# Patient Record
Sex: Male | Born: 1945 | Race: White | Hispanic: No | Marital: Married | Smoking: Former smoker
Health system: Southern US, Community
[De-identification: ages and names within clinical notes are randomized; demographics above are authoritative.]

## PROBLEM LIST (undated history)

## (undated) DIAGNOSIS — C801 Malignant (primary) neoplasm, unspecified: Secondary | ICD-10-CM

## (undated) DIAGNOSIS — E079 Disorder of thyroid, unspecified: Secondary | ICD-10-CM

## (undated) DIAGNOSIS — N289 Disorder of kidney and ureter, unspecified: Secondary | ICD-10-CM

## (undated) DIAGNOSIS — G4730 Sleep apnea, unspecified: Secondary | ICD-10-CM

## (undated) DIAGNOSIS — G600 Hereditary motor and sensory neuropathy: Secondary | ICD-10-CM

## (undated) DIAGNOSIS — K219 Gastro-esophageal reflux disease without esophagitis: Secondary | ICD-10-CM

## (undated) DIAGNOSIS — B349 Viral infection, unspecified: Secondary | ICD-10-CM

## (undated) DIAGNOSIS — Z860101 Personal history of adenomatous and serrated colon polyps: Secondary | ICD-10-CM

## (undated) DIAGNOSIS — Z86010 Personal history of colonic polyps: Secondary | ICD-10-CM

## (undated) DIAGNOSIS — F419 Anxiety disorder, unspecified: Secondary | ICD-10-CM

## (undated) DIAGNOSIS — D649 Anemia, unspecified: Secondary | ICD-10-CM

## (undated) DIAGNOSIS — G2581 Restless legs syndrome: Secondary | ICD-10-CM

## (undated) DIAGNOSIS — I10 Essential (primary) hypertension: Secondary | ICD-10-CM

## (undated) DIAGNOSIS — J329 Chronic sinusitis, unspecified: Secondary | ICD-10-CM

## (undated) HISTORY — DX: Essential (primary) hypertension: I10

## (undated) HISTORY — DX: Disorder of thyroid, unspecified: E07.9

## (undated) HISTORY — PX: GANGLION CYST EXCISION: SHX1691

## (undated) HISTORY — DX: Hereditary motor and sensory neuropathy: G60.0

## (undated) HISTORY — DX: Personal history of colonic polyps: Z86.010

## (undated) HISTORY — DX: Restless legs syndrome: G25.81

## (undated) HISTORY — PX: OTHER SURGICAL HISTORY: SHX169

## (undated) HISTORY — DX: Viral infection, unspecified: B34.9

## (undated) HISTORY — PX: NASAL SINUS SURGERY: SHX719

## (undated) HISTORY — DX: Personal history of adenomatous and serrated colon polyps: Z86.0101

## (undated) HISTORY — PX: EYE SURGERY: SHX253

---

## 2001-09-17 ENCOUNTER — Ambulatory Visit (HOSPITAL_COMMUNITY): Admission: RE | Admit: 2001-09-17 | Discharge: 2001-09-17 | Payer: Self-pay | Admitting: Internal Medicine

## 2001-10-06 ENCOUNTER — Ambulatory Visit (HOSPITAL_COMMUNITY): Admission: RE | Admit: 2001-10-06 | Discharge: 2001-10-06 | Payer: Self-pay | Admitting: Internal Medicine

## 2001-10-06 ENCOUNTER — Encounter: Payer: Self-pay | Admitting: Internal Medicine

## 2002-11-07 ENCOUNTER — Encounter: Payer: Self-pay | Admitting: Internal Medicine

## 2002-11-07 ENCOUNTER — Ambulatory Visit (HOSPITAL_COMMUNITY): Admission: RE | Admit: 2002-11-07 | Discharge: 2002-11-07 | Payer: Self-pay | Admitting: Internal Medicine

## 2004-04-08 ENCOUNTER — Ambulatory Visit: Admission: RE | Admit: 2004-04-08 | Discharge: 2004-04-08 | Payer: Self-pay | Admitting: *Deleted

## 2004-09-24 ENCOUNTER — Ambulatory Visit (HOSPITAL_COMMUNITY): Admission: RE | Admit: 2004-09-24 | Discharge: 2004-09-24 | Payer: Self-pay | Admitting: Internal Medicine

## 2004-09-24 ENCOUNTER — Ambulatory Visit: Payer: Self-pay | Admitting: Internal Medicine

## 2005-01-20 ENCOUNTER — Emergency Department (HOSPITAL_COMMUNITY): Admission: EM | Admit: 2005-01-20 | Discharge: 2005-01-20 | Payer: Self-pay | Admitting: *Deleted

## 2006-06-26 ENCOUNTER — Ambulatory Visit (HOSPITAL_COMMUNITY): Admission: RE | Admit: 2006-06-26 | Discharge: 2006-06-26 | Payer: Self-pay | Admitting: Sports Medicine

## 2006-07-09 ENCOUNTER — Encounter (HOSPITAL_COMMUNITY): Admission: RE | Admit: 2006-07-09 | Discharge: 2006-08-08 | Payer: Self-pay | Admitting: Sports Medicine

## 2007-07-06 ENCOUNTER — Ambulatory Visit (HOSPITAL_COMMUNITY): Admission: RE | Admit: 2007-07-06 | Discharge: 2007-07-06 | Payer: Self-pay | Admitting: Internal Medicine

## 2009-01-03 ENCOUNTER — Ambulatory Visit (HOSPITAL_COMMUNITY): Admission: RE | Admit: 2009-01-03 | Discharge: 2009-01-03 | Payer: Self-pay | Admitting: Internal Medicine

## 2009-01-11 ENCOUNTER — Ambulatory Visit (HOSPITAL_COMMUNITY): Admission: RE | Admit: 2009-01-11 | Discharge: 2009-01-11 | Payer: Self-pay | Admitting: Internal Medicine

## 2009-11-29 ENCOUNTER — Encounter: Payer: Self-pay | Admitting: Internal Medicine

## 2010-01-02 ENCOUNTER — Ambulatory Visit (HOSPITAL_COMMUNITY): Admission: RE | Admit: 2010-01-02 | Discharge: 2010-01-02 | Payer: Self-pay | Admitting: Internal Medicine

## 2010-01-02 ENCOUNTER — Ambulatory Visit: Payer: Self-pay | Admitting: Internal Medicine

## 2010-01-02 HISTORY — PX: COLONOSCOPY: SHX5424

## 2010-01-29 ENCOUNTER — Ambulatory Visit (HOSPITAL_COMMUNITY): Admission: RE | Admit: 2010-01-29 | Discharge: 2010-01-29 | Payer: Self-pay | Admitting: Neurology

## 2010-07-15 ENCOUNTER — Encounter: Payer: Self-pay | Admitting: Internal Medicine

## 2010-07-23 NOTE — Letter (Signed)
Summary: TRIAGE ORDER  TRIAGE ORDER   Imported By: Ave Filter 11/29/2009 13:49:41  _____________________________________________________________________  External Attachment:    Type:   Image     Comment:   External Document

## 2010-08-08 ENCOUNTER — Ambulatory Visit (INDEPENDENT_AMBULATORY_CARE_PROVIDER_SITE_OTHER): Payer: Self-pay | Admitting: Otolaryngology

## 2010-08-08 DIAGNOSIS — J310 Chronic rhinitis: Secondary | ICD-10-CM

## 2010-08-08 DIAGNOSIS — J343 Hypertrophy of nasal turbinates: Secondary | ICD-10-CM

## 2010-08-08 DIAGNOSIS — J342 Deviated nasal septum: Secondary | ICD-10-CM

## 2010-08-12 ENCOUNTER — Other Ambulatory Visit (INDEPENDENT_AMBULATORY_CARE_PROVIDER_SITE_OTHER): Payer: Self-pay | Admitting: Otolaryngology

## 2010-08-14 ENCOUNTER — Ambulatory Visit (HOSPITAL_COMMUNITY)
Admission: RE | Admit: 2010-08-14 | Discharge: 2010-08-14 | Disposition: A | Discharge status: Home / Self Care | Payer: BC Managed Care – PPO | Source: Ambulatory Visit | Attending: Otolaryngology | Admitting: Otolaryngology

## 2010-08-14 ENCOUNTER — Ambulatory Visit (HOSPITAL_COMMUNITY): Payer: BC Managed Care – PPO

## 2010-08-14 DIAGNOSIS — J329 Chronic sinusitis, unspecified: Secondary | ICD-10-CM | POA: Insufficient documentation

## 2010-08-14 DIAGNOSIS — R0602 Shortness of breath: Secondary | ICD-10-CM | POA: Insufficient documentation

## 2010-08-14 DIAGNOSIS — J3489 Other specified disorders of nose and nasal sinuses: Secondary | ICD-10-CM | POA: Insufficient documentation

## 2010-08-14 DIAGNOSIS — J342 Deviated nasal septum: Secondary | ICD-10-CM | POA: Insufficient documentation

## 2010-09-17 ENCOUNTER — Other Ambulatory Visit (INDEPENDENT_AMBULATORY_CARE_PROVIDER_SITE_OTHER): Payer: Self-pay | Admitting: Otolaryngology

## 2010-09-17 ENCOUNTER — Encounter (HOSPITAL_COMMUNITY): Payer: BC Managed Care – PPO

## 2010-09-17 LAB — BASIC METABOLIC PANEL
Calcium: 9 mg/dL (ref 8.4–10.5)
GFR calc non Af Amer: >60 mL/min (ref >60)
Glucose, Bld: 117 mg/dL — ABNORMAL HIGH (ref 70–99)
Potassium: 4 mEq/L (ref 3.5–5.1)
Sodium: 137 mEq/L (ref 135–145)

## 2010-09-17 LAB — HEMOGLOBIN AND HEMATOCRIT, BLOOD: HCT: 41.7 % (ref 39.0–52.0)

## 2010-09-19 ENCOUNTER — Other Ambulatory Visit (INDEPENDENT_AMBULATORY_CARE_PROVIDER_SITE_OTHER): Payer: Self-pay | Admitting: Otolaryngology

## 2010-09-19 ENCOUNTER — Ambulatory Visit (HOSPITAL_COMMUNITY)
Admission: RE | Admit: 2010-09-19 | Discharge: 2010-09-19 | Disposition: A | Discharge status: Home / Self Care | Payer: BC Managed Care – PPO | Source: Ambulatory Visit | Attending: Otolaryngology | Admitting: Otolaryngology

## 2010-09-19 DIAGNOSIS — J343 Hypertrophy of nasal turbinates: Secondary | ICD-10-CM

## 2010-09-19 DIAGNOSIS — Z7982 Long term (current) use of aspirin: Secondary | ICD-10-CM | POA: Insufficient documentation

## 2010-09-19 DIAGNOSIS — I10 Essential (primary) hypertension: Secondary | ICD-10-CM | POA: Insufficient documentation

## 2010-09-19 DIAGNOSIS — Z79899 Other long term (current) drug therapy: Secondary | ICD-10-CM | POA: Insufficient documentation

## 2010-09-19 DIAGNOSIS — D386 Neoplasm of uncertain behavior of respiratory organ, unspecified: Secondary | ICD-10-CM

## 2010-09-19 DIAGNOSIS — J339 Nasal polyp, unspecified: Secondary | ICD-10-CM | POA: Insufficient documentation

## 2010-09-19 DIAGNOSIS — J342 Deviated nasal septum: Secondary | ICD-10-CM

## 2010-09-26 ENCOUNTER — Ambulatory Visit (INDEPENDENT_AMBULATORY_CARE_PROVIDER_SITE_OTHER): Payer: BC Managed Care – PPO | Admitting: Otolaryngology

## 2010-09-30 ENCOUNTER — Other Ambulatory Visit (HOSPITAL_COMMUNITY): Payer: BC Managed Care – PPO

## 2010-10-02 NOTE — Op Note (Signed)
NAMELAWAYNE, HARTIG                 ACCOUNT NO.:  [id number]  MEDICAL RECORD NO.:  [id number]           PATIENT TYPE:  O  LOCATION:  DAYP                          FACILITY:  [facility]  PHYSICIAN:  Newman Pies, MD            DATE OF BIRTH:  Dec 23, 1945  DATE OF PROCEDURE:  09/19/2010 DATE OF DISCHARGE:                              OPERATIVE REPORT   SURGEON:  Newman Pies, MD  PREOPERATIVE DIAGNOSES: 1. Severe nasal septal deviation. 2. Bilateral inferior turbinate hypertrophy.  POSTOPERATIVE DIAGNOSES: 1. Severe nasal septal deviation. 2. Bilateral inferior turbinate hypertrophy. 3. Right septal mass.  PROCEDURES PERFORMED: 1. Septoplasty. 2. Endoscopic excision of right nasal mass. 3. Bilateral partial inferior turbinate resection.  ANESTHESIA:  General endotracheal tube anesthesia.  COMPLICATIONS:  None.  ESTIMATED BLOOD LOSS:  50 mL.  INDICATIONS FOR PROCEDURE:  The patient is a 65 year old male with a history of chronic nasal obstruction.  He was previously treated with topical and oral steroids, decongestant, antihistamine, and antibiotics without improvement in his chronic nasal obstruction.  On examination, the patient was noted to have a severe nasal septal deviation to the right.  He was also noted to have bilateral inferior turbinate hypertrophy.  The nasal septal deviation was noted to nearly completely obstructing the right nasal cavity.  His inferior turbinate hypertrophy was also responsible for 90% obstruction of the left nasal cavity. Based on the above findings, the decision was made for the patient to undergo the septoplasty and bilateral partial inferior turbinate resection procedure.  The risks, benefits, alternatives, and details of the procedure were discussed with the patient.  Questions were invited and answered.  Informed consent was obtained.  DESCRIPTION:  The patient was taken to the operating room and placed supine on the operating room table.  General  endotracheal tube anesthesia was administered by the anesthesiologist.  The patient was positioned and prepped and draped in a standard fashion for nasal surgery.  Pledgets soaked with Afrin were placed in both nasal cavities for vasoconstriction.  The pledgets were subsequently removed.  Nasal examination showed severe nasal septal deviation to the right.  Both inferior turbinates were also severely hypertrophied.  Lidocaine 1% with 1:100,000 epinephrine was injected onto the nasal septum bilaterally. At this time, a 1.5 cm soft tissue mass was noted on the right nasal septum, posterior to the deviated portion of the nasal cartilage.  Based on the new clinical findings, the decision was made to remove the nasal mass endoscopically.  Under the guidance of a 0 degree endoscope, the mass was carefully removed in a piecemeal fashion.  The mass was noted to erode part of the midportion of cartilaginous nasal septum.  The soft tissue mass specimens were sent to the pathology department for permanent histologic identification.  A standard hemitransfixion was then placed through the left nostril. The mucosal flap was carefully elevated on the left side in a standard fashion.  It should be noted that the midportion of the cartilaginous nasal septum was completely eroded by the soft tissue mass.  The residual mucosal flap was then elevated  on the right side.  The deviated portion of the cartilaginous and bony septum, including bilateral inferior bony spurs were removed.  The remaining septum was then quilted with through-and-through 4-0 plain gut sutures.  The hemitransfixion incision was closed in an interrupted fashion with 4-0 chromic sutures.  Attention was then focused on the inferior turbinates.  The inferior one half of each inferior turbinate was crossclamped with a pair of straight Kelly clamps.  The inferior one half of each inferior turbinate was resected with a pair of crosscutting  scissors.  Both nasal cavities were copiously irrigated.  Doyle splint was applied to each side of the septum.  That concluded the procedure for the patient.  The care of the patient was turned over to the anesthesiologist.  The patient was awakened from anesthesia without difficulty.  He was extubated and transferred to the recovery room in good condition.  OPERATIVE FINDINGS: 1. Severe nasal septal deviation to the right.  A 1.5 cm soft tissue     mass was noted posterior to the deviated nasal cartilage.  The mass     was noted to have eroded through the cartilage.  The nasal mass was     removed and sent to the pathology department for permanent     histologic identification. 2. Right nasal mass.  FOLLOWUP CARE:  The patient will be discharged home once he is awake and alert.  He will follow up in my office in approximately 1 week.  He will be placed on Vicodin 1-2 tablets p.o. q.4-6 h. p.r.n. pain, and amoxicillin 500 mg p.o. t.i.d. for 5 days.  The patient will follow up in my office in 1 week.     Newman Pies, MD     ST/MEDQ  D:  09/19/2010  T:  09/19/2010  Job:  [id number]  cc:   Madelin Rear. Sherwood Gambler, MD Fax: [phone]  Electronically Signed by Newman Pies MD on 10/02/2010 11:33:01 AM

## 2010-10-17 ENCOUNTER — Ambulatory Visit (INDEPENDENT_AMBULATORY_CARE_PROVIDER_SITE_OTHER): Payer: BC Managed Care – PPO | Admitting: Otolaryngology

## 2010-11-08 NOTE — Procedures (Signed)
NAMEMARION, Dakota Thompson                 ACCOUNT NO.:  [id number]   MEDICAL RECORD NO.:  [id number]          PATIENT TYPE:  OUT   LOCATION:  SLEEP LAB                     FACILITY:  [facility]   PHYSICIAN:  Marcelyn Bruins, M.D. [facility] DATE OF BIRTH:  [birth year]   DATE OF STUDY:  04/08/2004                              NOCTURNAL POLYSOMNOGRAM   REFERRING PHYSICIAN:  Dani Gobble, M.D.   INDICATIONS FOR THE STUDY:  Hypersomnia with sleep apnea.   SLEEP ARCHITECTURE:  The patient had a total sleep time of 351 minutes with  a sleep deficiency of only 82%.  He had very fragmented sleep throughout the  night.  He was found to have greatly decreased REM and slow wave sleep.  Sleep onset latency was normal and REM latency surprisingly was earlier than  usual.   IMPRESSION:  1.  Very mild obstructive sleep apnea/hypopnea syndrome with a respiratory      disturbance index of seven events per hour and O2 desaturation as low as      83%.  Events were not positional nor were they primarily REM related.  2.  Moderate to loud snoring noted throughout the study.  3.  No clinically significant cardiac arrhythmias.  4.  Large numbers of periodic leg movements with significant sleep      disruption.  Clinical correlation is suggested to see whether the      patient has a restless leg syndrome.  If this does indeed fit with the      clinical scenario, perhaps a trial of Requip 1 mg q.h.s. is indicated.      KC/MEDQ  D:  04/22/2004 11:28:52  T:  04/22/2004 11:43:24  Job:  [id number]

## 2010-11-08 NOTE — Op Note (Signed)
[facility]  Patient:    Dakota Thompson, Dakota Thompson Visit Number: [id number] MRN: [id number]          Service Type: END Location: DAY Attending Physician:  Jonathon Bellows Dictated by:   Roetta Sessions, M.D. Proc. Date: 09/17/01 Admit Date:  09/17/2001   CC:         Patrica Duel, M.D.   Operative Report  PROCEDURE:  Colonoscopy with biopsy.  ENDOSCOPIST:  Roetta Sessions, M.D.  INDICATIONS FOR PROCEDURE:  This patient is a 65 year old gentleman with positive family history of colorectal cancer.  He has intermittent hematochezia.  He has never had his lower GI tract imaged.  Colonoscopy is now being done as a high-risk screening.  This approach has been discussed with the patient at the bedside.  The potential risks, benefits, and alternatives have been reviewed, questions answered, he is agreeable.  Please see my handwritten H&P for more information.  PROCEDURE NOTE:  O2 saturation, blood pressure, pulse oximetry were monitored throughout the entire procedure.  CONSCIOUS SEDATION:  Versed 2 mg IV, Demerol 50  mg IV in divided doses.  INSTRUMENT:  Olympus videochip colonoscope.  FINDINGS:  Digital rectal examination revealed no abnormalities.  VIDEOSCOPIC FINDINGS:  The prep was good.  RECTUM:  Examination of the rectal mucosa including a retroflex view of the anal verge revealed only some anal canal hemorrhoids.  COLON:  The colonic mucosa was surveyed from the rectosigmoid junction through the left transverse right colon to the area of the appendiceal orifice, ileocecal valve, and cecum.  These structures were well seen and photographed for the record.  The patient has scattered left-sided diverticula with a 3-mm polyp at the base of the ileocecal valve which was cold biopsied/removed. From this level the scope was slowly withdrawn.  All previously mentioned mucosal surfaces were again seen.  No other abnormalities were observed.  The patient  tolerated the procedure well and was reacted in endoscopy.  IMPRESSION: 1. Anal canal hemorrhoids, otherwise normal rectum. 2. Scattered left-sided diverticula, diminutive polyp in the right colon, cold    biopsed/removed.  The remainder of the colonic mucosa appeared normal.  RECOMMENDATIONS: 1. Hemorrhoid literature. 2. Anusol HC suppositories 1 per rectum at bed time x10 days. 3. Increase fiber in the diet. 4. Further recommendations to follow. Dictated by:   Roetta Sessions, M.D. Attending Physician:  Jonathon Bellows DD:  09/17/01 TD:  09/18/01 Job: [id number]

## 2010-11-08 NOTE — Op Note (Signed)
NAME:  Dakota Thompson, Dakota Thompson                 ACCOUNT NO.:  [id number]   MEDICAL RECORD NO.:  [id number]          PATIENT TYPE:  AMB   LOCATION:  DAY                           FACILITY:  [facility]   PHYSICIAN:  R. Roetta Sessions, M.D. DATE OF BIRTH:  Jan 16, 1946   DATE OF PROCEDURE:  09/24/2004  DATE OF DISCHARGE:                                 OPERATIVE REPORT   PROCEDURE PERFORMED:  Surveillance colonoscopy.   INDICATIONS FOR PROCEDURE:  The patient is a 65 year old gentleman who  underwent colonoscopy three years ago.  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  It was resected.  He is here for  surveillance.  He is devoid of any GI tract symptoms.  This approach has  been discussed with the patient along with potential risks, benefits and  alternatives.  Please see documentation in the medical record.   PROCEDURE NOTE:  Oxygen saturations, blood pressure, pulse, respirations  were monitored throughout the entire procedure.  Conscious sedation was  Versed 3 mg IV, Demerol 75 mg IV in divided doses.   INSTRUMENT USED:  Olympus video chip system.   FINDINGS:  Digital rectal exam revealed no abnormalities.   ENDOSCOPIC FINDINGS:  Prep was good.   Rectum:  Examination of the rectal mucosa including retroflex view of the  anal verge revealed  no abnormalities.   Colon:  Colonic mucosa was surveyed from rectosigmoid junction through the  left, transverse and right colon to the area of the appendiceal orifice,  ileocecal valve and cecum.  These structures were well seen and photographed  for the record.  From this level, the scope was slowly withdrawn.  All  previously mentioned mucosal surfaces were again seen.  The colonic mucosa  appeared normal.  The patient tolerated the procedure well.  He was reacted  in endoscopy.   IMPRESSION:  1.  Normal rectum.  2.  Normal colon.   RECOMMENDATIONS:  Repeat colonoscopy in five years.    RMR/MEDQ  D:  09/24/2004  T:   09/24/2004  Job:  [id number]

## 2010-11-13 ENCOUNTER — Emergency Department (HOSPITAL_COMMUNITY): Payer: BC Managed Care – PPO

## 2010-11-13 ENCOUNTER — Emergency Department (HOSPITAL_COMMUNITY)
Admission: EM | Admit: 2010-11-13 | Discharge: 2010-11-13 | Disposition: A | Discharge status: Home / Self Care | Payer: BC Managed Care – PPO | Attending: Emergency Medicine | Admitting: Emergency Medicine

## 2010-11-13 DIAGNOSIS — R5381 Other malaise: Secondary | ICD-10-CM | POA: Insufficient documentation

## 2010-11-13 DIAGNOSIS — R509 Fever, unspecified: Secondary | ICD-10-CM | POA: Insufficient documentation

## 2010-11-13 DIAGNOSIS — G600 Hereditary motor and sensory neuropathy: Secondary | ICD-10-CM | POA: Insufficient documentation

## 2010-11-13 DIAGNOSIS — R5383 Other fatigue: Secondary | ICD-10-CM | POA: Insufficient documentation

## 2010-11-13 DIAGNOSIS — Z79899 Other long term (current) drug therapy: Secondary | ICD-10-CM | POA: Insufficient documentation

## 2010-11-13 DIAGNOSIS — I10 Essential (primary) hypertension: Secondary | ICD-10-CM | POA: Insufficient documentation

## 2010-11-13 DIAGNOSIS — Z7982 Long term (current) use of aspirin: Secondary | ICD-10-CM | POA: Insufficient documentation

## 2010-11-13 LAB — DIFFERENTIAL
Basophils Absolute: 0 10*3/uL (ref 0.0–0.1)
Basophils Relative: 0 % (ref 0–1)
Eosinophils Absolute: 0 10*3/uL (ref 0.0–0.7)
Eosinophils Relative: 0 % (ref 0–5)
Monocytes Absolute: 0.2 10*3/uL (ref 0.1–1.0)

## 2010-11-13 LAB — CBC
MCH: 27.7 pg (ref 26.0–34.0)
MCHC: 32.8 g/dL (ref 30.0–36.0)
RDW: 14.7 % (ref 11.5–15.5)

## 2010-11-13 LAB — POCT CARDIAC MARKERS
CKMB, poc: <1 ng/mL — ABNORMAL LOW (ref 1.0–8.0)
Myoglobin, poc: 165 ng/mL (ref 12–200)
Troponin i, poc: <0.05 ng/mL (ref 0.00–0.09)

## 2010-11-13 LAB — BASIC METABOLIC PANEL
Calcium: 9.8 mg/dL (ref 8.4–10.5)
GFR calc non Af Amer: 40 mL/min — ABNORMAL LOW (ref >60)
Potassium: 4.8 mEq/L (ref 3.5–5.1)
Sodium: 130 mEq/L — ABNORMAL LOW (ref 135–145)

## 2010-11-13 LAB — URINALYSIS, ROUTINE W REFLEX MICROSCOPIC
Leukocytes, UA: NEGATIVE
Protein, ur: 100 mg/dL — AB
Specific Gravity, Urine: 1.02 (ref 1.005–1.030)
pH: 6 (ref 5.0–8.0)

## 2010-11-13 LAB — URINE MICROSCOPIC-ADD ON

## 2010-11-13 LAB — HEPATIC FUNCTION PANEL
AST: 79 U/L — ABNORMAL HIGH (ref 0–37)
Albumin: 3.4 g/dL — ABNORMAL LOW (ref 3.5–5.2)
Bilirubin, Direct: 0.3 mg/dL (ref 0.0–0.3)

## 2010-11-13 LAB — GLUCOSE, CAPILLARY

## 2010-11-14 LAB — ROCKY MTN SPOTTED FVR AB, IGG-BLOOD

## 2010-11-14 LAB — ROCKY MTN SPOTTED FVR AB, IGM-BLOOD

## 2010-11-14 LAB — B. BURGDORFI ANTIBODIES

## 2010-12-10 ENCOUNTER — Other Ambulatory Visit (HOSPITAL_COMMUNITY): Payer: Self-pay | Admitting: Oncology

## 2010-12-10 ENCOUNTER — Encounter (HOSPITAL_COMMUNITY): Payer: BC Managed Care – PPO | Attending: Oncology | Admitting: Oncology

## 2010-12-10 DIAGNOSIS — Z79899 Other long term (current) drug therapy: Secondary | ICD-10-CM | POA: Insufficient documentation

## 2010-12-10 DIAGNOSIS — I10 Essential (primary) hypertension: Secondary | ICD-10-CM | POA: Insufficient documentation

## 2010-12-10 DIAGNOSIS — D61818 Other pancytopenia: Secondary | ICD-10-CM

## 2010-12-10 DIAGNOSIS — G600 Hereditary motor and sensory neuropathy: Secondary | ICD-10-CM | POA: Insufficient documentation

## 2010-12-10 LAB — RETICULOCYTES
RBC.: 4.93 MIL/uL (ref 4.22–5.81)
Retic Count, Absolute: 103.5 10*3/uL (ref 19.0–186.0)
Retic Ct Pct: 2.1 % (ref 0.4–3.1)

## 2010-12-10 LAB — CBC
HCT: 42.2 % (ref 39.0–52.0)
MCHC: 33.2 g/dL (ref 30.0–36.0)
RDW: 15.1 % (ref 11.5–15.5)
WBC: 6.6 10*3/uL (ref 4.0–10.5)

## 2010-12-10 LAB — DIFFERENTIAL
Basophils Absolute: 0.1 10*3/uL (ref 0.0–0.1)
Basophils Relative: 1 % (ref 0–1)
Eosinophils Relative: 2 % (ref 0–5)
Lymphocytes Relative: 31 % (ref 12–46)
Monocytes Absolute: 0.6 10*3/uL (ref 0.1–1.0)
Neutro Abs: 3.8 10*3/uL (ref 1.7–7.7)

## 2010-12-11 ENCOUNTER — Ambulatory Visit (HOSPITAL_COMMUNITY): Payer: BC Managed Care – PPO | Admitting: Oncology

## 2010-12-18 ENCOUNTER — Other Ambulatory Visit (HOSPITAL_COMMUNITY): Payer: Self-pay | Admitting: Sports Medicine

## 2010-12-18 DIAGNOSIS — S83206A Unspecified tear of unspecified meniscus, current injury, right knee, initial encounter: Secondary | ICD-10-CM

## 2010-12-20 ENCOUNTER — Other Ambulatory Visit (HOSPITAL_COMMUNITY): Payer: BC Managed Care – PPO

## 2010-12-23 ENCOUNTER — Ambulatory Visit (HOSPITAL_COMMUNITY)
Admission: RE | Admit: 2010-12-23 | Discharge: 2010-12-23 | Disposition: A | Discharge status: Home / Self Care | Payer: BC Managed Care – PPO | Source: Ambulatory Visit | Attending: Sports Medicine | Admitting: Sports Medicine

## 2010-12-23 DIAGNOSIS — S83206A Unspecified tear of unspecified meniscus, current injury, right knee, initial encounter: Secondary | ICD-10-CM

## 2010-12-23 DIAGNOSIS — M25569 Pain in unspecified knee: Secondary | ICD-10-CM | POA: Insufficient documentation

## 2010-12-23 DIAGNOSIS — M23329 Other meniscus derangements, posterior horn of medial meniscus, unspecified knee: Secondary | ICD-10-CM | POA: Insufficient documentation

## 2011-01-09 ENCOUNTER — Ambulatory Visit (INDEPENDENT_AMBULATORY_CARE_PROVIDER_SITE_OTHER): Payer: BC Managed Care – PPO | Admitting: Otolaryngology

## 2011-01-09 DIAGNOSIS — J310 Chronic rhinitis: Secondary | ICD-10-CM

## 2011-01-21 ENCOUNTER — Encounter (HOSPITAL_COMMUNITY): Payer: BC Managed Care – PPO | Attending: Oncology

## 2011-01-21 DIAGNOSIS — I10 Essential (primary) hypertension: Secondary | ICD-10-CM | POA: Insufficient documentation

## 2011-01-21 DIAGNOSIS — G600 Hereditary motor and sensory neuropathy: Secondary | ICD-10-CM | POA: Insufficient documentation

## 2011-01-21 DIAGNOSIS — Z79899 Other long term (current) drug therapy: Secondary | ICD-10-CM | POA: Insufficient documentation

## 2011-01-21 DIAGNOSIS — D61818 Other pancytopenia: Secondary | ICD-10-CM

## 2011-01-21 LAB — CBC
Platelets: 180 10*3/uL (ref 150–400)
RBC: 4.82 MIL/uL (ref 4.22–5.81)
WBC: 4.9 10*3/uL (ref 4.0–10.5)

## 2011-01-21 LAB — DIFFERENTIAL
Lymphocytes Relative: 41 % (ref 12–46)
Lymphs Abs: 2 10*3/uL (ref 0.7–4.0)
Neutro Abs: 2.4 10*3/uL (ref 1.7–7.7)
Neutrophils Relative %: 49 % (ref 43–77)

## 2011-01-21 NOTE — Progress Notes (Signed)
Labs drawn today for cbc/diff 

## 2011-01-31 HISTORY — PX: OTHER SURGICAL HISTORY: SHX169

## 2011-02-06 ENCOUNTER — Other Ambulatory Visit (HOSPITAL_COMMUNITY): Payer: Self-pay | Admitting: Orthopedic Surgery

## 2011-02-06 DIAGNOSIS — R609 Edema, unspecified: Secondary | ICD-10-CM

## 2011-02-06 DIAGNOSIS — R52 Pain, unspecified: Secondary | ICD-10-CM

## 2011-02-07 ENCOUNTER — Ambulatory Visit (HOSPITAL_COMMUNITY)
Admission: RE | Admit: 2011-02-07 | Discharge: 2011-02-07 | Disposition: A | Discharge status: Home / Self Care | Payer: BC Managed Care – PPO | Source: Ambulatory Visit | Attending: Orthopedic Surgery | Admitting: Orthopedic Surgery

## 2011-02-07 DIAGNOSIS — M79609 Pain in unspecified limb: Secondary | ICD-10-CM | POA: Insufficient documentation

## 2011-02-07 DIAGNOSIS — M25569 Pain in unspecified knee: Secondary | ICD-10-CM | POA: Insufficient documentation

## 2011-02-07 DIAGNOSIS — M7989 Other specified soft tissue disorders: Secondary | ICD-10-CM | POA: Insufficient documentation

## 2011-02-07 DIAGNOSIS — R52 Pain, unspecified: Secondary | ICD-10-CM

## 2011-02-07 DIAGNOSIS — R609 Edema, unspecified: Secondary | ICD-10-CM

## 2011-02-27 ENCOUNTER — Encounter (HOSPITAL_COMMUNITY): Payer: BC Managed Care – PPO | Attending: Oncology | Admitting: Oncology

## 2011-02-27 ENCOUNTER — Encounter (HOSPITAL_COMMUNITY): Payer: Self-pay | Admitting: Oncology

## 2011-02-27 DIAGNOSIS — D61818 Other pancytopenia: Secondary | ICD-10-CM | POA: Insufficient documentation

## 2011-02-27 DIAGNOSIS — R7989 Other specified abnormal findings of blood chemistry: Secondary | ICD-10-CM

## 2011-02-27 DIAGNOSIS — B349 Viral infection, unspecified: Secondary | ICD-10-CM

## 2011-02-27 DIAGNOSIS — B9789 Other viral agents as the cause of diseases classified elsewhere: Secondary | ICD-10-CM

## 2011-02-27 DIAGNOSIS — R7402 Elevation of levels of lactic acid dehydrogenase (LDH): Secondary | ICD-10-CM | POA: Insufficient documentation

## 2011-02-27 DIAGNOSIS — R7401 Elevation of levels of liver transaminase levels: Secondary | ICD-10-CM | POA: Insufficient documentation

## 2011-02-27 HISTORY — DX: Viral infection, unspecified: B34.9

## 2011-02-27 LAB — COMPREHENSIVE METABOLIC PANEL
Albumin: 4.4 g/dL (ref 3.5–5.2)
Alkaline Phosphatase: 59 U/L (ref 39–117)
BUN: 21 mg/dL (ref 6–23)
Calcium: 9.8 mg/dL (ref 8.4–10.5)
GFR calc Af Amer: >60 mL/min (ref >60)
Glucose, Bld: 106 mg/dL — ABNORMAL HIGH (ref 70–99)
Potassium: 3.6 mEq/L (ref 3.5–5.1)
Sodium: 136 mEq/L (ref 135–145)
Total Protein: 7.8 g/dL (ref 6.0–8.3)

## 2011-02-27 LAB — DIFFERENTIAL
Basophils Relative: 1 % (ref 0–1)
Eosinophils Absolute: 0.1 10*3/uL (ref 0.0–0.7)
Eosinophils Relative: 2 % (ref 0–5)
Lymphs Abs: 1.5 10*3/uL (ref 0.7–4.0)
Monocytes Relative: 7 % (ref 3–12)
Neutrophils Relative %: 62 % (ref 43–77)

## 2011-02-27 LAB — CBC
MCH: 28.5 pg (ref 26.0–34.0)
MCHC: 33.3 g/dL (ref 30.0–36.0)
MCV: 85.7 fL (ref 78.0–100.0)
Platelets: 209 10*3/uL (ref 150–400)
RBC: 5.09 MIL/uL (ref 4.22–5.81)

## 2011-02-27 NOTE — Patient Instructions (Addendum)
[facility]  Discharge Instructions  RECOMMENDATIONS MADE BY THE CONSULTANT AND ANY TEST RESULTS WILL BE SENT TO YOUR REFERRING DOCTOR.   EXAM FINDINGS BY MD TODAY AND SIGNS AND SYMPTOMS TO REPORT TO CLINIC OR PRIMARY MD: Will check some labs today and again in 3 months to see how your are doing.  MEDICATIONS PRESCRIBED: none   INSTRUCTIONS GIVEN AND DISCUSSED: Report frequent infections.  SPECIAL INSTRUCTIONS/FOLLOW-UP: Lab work Needed in 3 months  and Return to Clinic in 3 months.   I acknowledge that I have been informed and understand all the instructions given to me and received a copy. I do not have any more questions at this time, but understand that I may call the Specialty Clinic at [facility] at [phone] during business hours should I have any further questions or need assistance in obtaining follow-up care.    __________________________________________  _____________  __________ Signature of Patient or Authorized Representative            Date                   Time    __________________________________________ Nurse's Signature

## 2011-02-27 NOTE — Progress Notes (Signed)
Dakota Thompson., MD [address] [place] [address]  1. Pancytopenia  CBC, Differential, CBC, Differential, CBC, Differential  2. Viral syndrome  Comprehensive metabolic panel, Comprehensive metabolic panel, Comprehensive metabolic panel     INTERVAL HISTORY: Dakota Thompson 65 y.o. male returns for  regular  visit for followup of pancytopenia in the setting of 2 elevated liver enzymes (ALT and AST).  The patient denies any complaints today.  He reports that he recently underwent right knee arthroscopy.  He still has some discomfort associated with that.  He denies any B symptoms including fevers, chills, and night sweats.   He clearly is nervous today but this may be secondary to the fact that I am seeing him in an exam room at 3:35pm (for a 3:30 appointment) and he has another appointment at 4 pm.    I personally reviewed and went over laboratory results with the patient.  His lab work has normalized regarding his blood counts.     Past Medical History  Diagnosis Date  . Hypertension   . Thyroid disease   . Pancytopenia     mild  . Charcot-Marie-Tooth disease     hx  . Restless leg syndrome   . Viral syndrome     Hx  . Pancytopenia 02/27/2011  . Viral syndrome 02/27/2011    has Pancytopenia and Viral syndrome on his problem list.      has no known allergies.  Dakota Thompson does not currently have medications on file.  Past Surgical History  Procedure Date  . Ganglion cyst excision   . Nasal sinus surgery   . Knee cartilage repair 01/31/11    Denies any headaches, dizziness, double vision, fevers, chills, night sweats, nausea, vomiting, diarrhea, constipation, chest pain, heart palpitations, shortness of breath, blood in stool, black tarry stool, urinary pain, urinary burning, urinary frequency, hematuria.   PHYSICAL EXAMINATION  Filed Vitals:   02/27/11 1503  BP: 108/74  Pulse: 76  Temp: 99.4 F (37.4 C)    GENERAL:alert, healthy, no  distress, well nourished, well developed, comfortable and cooperative SKIN: skin color, texture, turgor are normal HEAD: Normocephalic EYES: normal EARS: External ears normal OROPHARYNX:mucous membranes are moist  NECK: trachea midline LYMPH:  no palpable lymphadenopathy, no hepatosplenomegaly BREAST:not examined LUNGS: clear to auscultation and percussion HEART: regular rate & rhythm, no murmurs, no gallops, S1 normal and S2 normal ABDOMEN:abdomen soft, non-tender, obese, normal bowel sounds, no masses or organomegaly and no hepatosplenomegaly BACK: Back symmetric, no curvature., No CVA tenderness EXTREMITIES:less then 2 second capillary refill, no joint deformities, effusion, or inflammation, no edema, no skin discoloration, no clubbing, no cyanosis. Healed scar noted on right knee NEURO: alert & oriented x 3 with fluent speech, no focal motor/sensory deficits, gait normal    LABORATORY DATA: CBC    Component Value Date/Time   WBC 4.9 01/21/2011 0900   RBC 4.82 01/21/2011 0900   HGB 13.6 01/21/2011 0900   HCT 40.8 01/21/2011 0900   PLT 180 01/21/2011 0900   MCV 84.6 01/21/2011 0900   MCH 28.2 01/21/2011 0900   MCHC 33.3 01/21/2011 0900   RDW 14.5 01/21/2011 0900   LYMPHSABS 2.0 01/21/2011 0900   MONOABS 0.4 01/21/2011 0900   EOSABS 0.1 01/21/2011 0900   BASOSABS 0.0 01/21/2011 0900      ASSESSMENT:  1. H/O Viral Syndrome 2. H/O Pancytopenia in the setting of two elevated liver enzymes (AST and ALT) 3. AST and ALT elevations   PLAN:  1. Lab work today and in three months: CBC diff, CMET 2. Return in three months for follow-up 3. I personally reviewed and went over laboratory results with the patient. 4. I spent some time going over patient education regarding viral syndrome.   All questions were answered. The patient knows to call the clinic with any problems, questions or concerns. We can certainly see the patient much sooner if necessary.     Dakota Thompson

## 2011-02-28 ENCOUNTER — Telehealth (HOSPITAL_COMMUNITY): Payer: Self-pay

## 2011-02-28 NOTE — Telephone Encounter (Signed)
Message relayed to wife that [facility] labs were within normal limits and that liver enzymes have normalized per request of Dellis Anes, [place]

## 2011-05-19 ENCOUNTER — Encounter (HOSPITAL_COMMUNITY): Payer: BC Managed Care – PPO | Attending: Oncology

## 2011-05-19 DIAGNOSIS — B9789 Other viral agents as the cause of diseases classified elsewhere: Secondary | ICD-10-CM | POA: Insufficient documentation

## 2011-05-19 DIAGNOSIS — D61818 Other pancytopenia: Secondary | ICD-10-CM

## 2011-05-19 DIAGNOSIS — B349 Viral infection, unspecified: Secondary | ICD-10-CM

## 2011-05-19 LAB — DIFFERENTIAL
Basophils Absolute: 0.1 10*3/uL (ref 0.0–0.1)
Eosinophils Relative: 2 % (ref 0–5)
Lymphocytes Relative: 35 % (ref 12–46)
Lymphs Abs: 1.8 10*3/uL (ref 0.7–4.0)
Monocytes Absolute: 0.4 10*3/uL (ref 0.1–1.0)
Monocytes Relative: 8 % (ref 3–12)
Neutro Abs: 2.7 10*3/uL (ref 1.7–7.7)

## 2011-05-19 LAB — COMPREHENSIVE METABOLIC PANEL
AST: 23 U/L (ref 0–37)
BUN: 12 mg/dL (ref 6–23)
CO2: 27 mEq/L (ref 19–32)
Calcium: 9.7 mg/dL (ref 8.4–10.5)
Chloride: 103 mEq/L (ref 96–112)
Creatinine, Ser: 0.96 mg/dL (ref 0.50–1.35)
GFR calc Af Amer: >90 mL/min (ref >90)
GFR calc non Af Amer: 86 mL/min — ABNORMAL LOW (ref >90)
Glucose, Bld: 112 mg/dL — ABNORMAL HIGH (ref 70–99)
Total Bilirubin: 0.3 mg/dL (ref 0.3–1.2)

## 2011-05-19 LAB — CBC
HCT: 42.9 % (ref 39.0–52.0)
Hemoglobin: 14.1 g/dL (ref 13.0–17.0)
MCV: 86.1 fL (ref 78.0–100.0)
WBC: 5 10*3/uL (ref 4.0–10.5)

## 2011-05-19 NOTE — Progress Notes (Signed)
Labs drawn today for cbc/diff,cmp,

## 2011-05-21 ENCOUNTER — Ambulatory Visit (HOSPITAL_COMMUNITY): Payer: BC Managed Care – PPO | Admitting: Oncology

## 2011-05-23 ENCOUNTER — Encounter (HOSPITAL_COMMUNITY): Payer: Self-pay | Admitting: Oncology

## 2011-05-23 ENCOUNTER — Encounter (HOSPITAL_BASED_OUTPATIENT_CLINIC_OR_DEPARTMENT_OTHER): Payer: BC Managed Care – PPO | Admitting: Oncology

## 2011-05-23 DIAGNOSIS — D61818 Other pancytopenia: Secondary | ICD-10-CM

## 2011-05-23 NOTE — Progress Notes (Signed)
This office note has been dictated.

## 2011-05-23 NOTE — Progress Notes (Signed)
PRIMARY CARE PHYSICIAN:  Dr. Artis Delay.  DIAGNOSIS:  Transient pancytopenia which has resolved.  NARRATIVE:  Dakota Thompson's counts have all returned to normal and have remained normal now.  His last 2 blood tests, most recent 05/19/2011 and 02/27/2011, show a completely normal CBC and differential.  His liver enzymes have also reverted to normal, so I suspect he had a transient viral syndrome which caused this issue of temporary pancytopenia.  To that end, I think we can release him from this clinic.  In retrospect, he has actually had normal blood work now since June, and it has not faltered once since then.  So we will see him only on a p.r.n. basis going forward.  He was delighted with the news as was his wife.    ______________________________ Ladona Horns. Mariel Sleet, MD ESN/MEDQ  D:  05/23/2011  T:  05/23/2011  Job:  [id number]

## 2011-05-23 NOTE — Patient Instructions (Signed)
[facility]  Discharge Instructions Dakota Thompson  [id number] Nov 17, 1945   RECOMMENDATIONS MADE BY THE CONSULTANT AND ANY TEST RESULTS WILL BE SENT TO YOUR REFERRING DOCTOR.   EXAM FINDINGS BY MD TODAY AND SIGNS AND SYMPTOMS TO REPORT TO CLINIC OR PRIMARY MD: labs have been stable so we can release you. Call if you have any problems     I acknowledge that I have been informed and understand all the instructions given to me and received a copy. I do not have any more questions at this time, but understand that I may call the Specialty Clinic at [facility] at [phone] during business hours should I have any further questions or need assistance in obtaining follow-up care.    __________________________________________  _____________  __________ Signature of Patient or Authorized Representative            Date                   Time    __________________________________________ Nurse's Signature

## 2011-11-06 ENCOUNTER — Other Ambulatory Visit (HOSPITAL_COMMUNITY): Payer: Self-pay | Admitting: Orthopedic Surgery

## 2011-11-06 DIAGNOSIS — S83249A Other tear of medial meniscus, current injury, unspecified knee, initial encounter: Secondary | ICD-10-CM

## 2011-11-10 ENCOUNTER — Ambulatory Visit (HOSPITAL_COMMUNITY)
Admission: RE | Admit: 2011-11-10 | Discharge: 2011-11-10 | Disposition: A | Discharge status: Home / Self Care | Payer: BC Managed Care – PPO | Source: Ambulatory Visit | Attending: Orthopedic Surgery | Admitting: Orthopedic Surgery

## 2011-11-10 DIAGNOSIS — M25569 Pain in unspecified knee: Secondary | ICD-10-CM | POA: Insufficient documentation

## 2011-11-10 DIAGNOSIS — M23329 Other meniscus derangements, posterior horn of medial meniscus, unspecified knee: Secondary | ICD-10-CM | POA: Insufficient documentation

## 2011-11-10 DIAGNOSIS — S83249A Other tear of medial meniscus, current injury, unspecified knee, initial encounter: Secondary | ICD-10-CM

## 2013-01-07 ENCOUNTER — Encounter: Payer: Self-pay | Admitting: Neurology

## 2013-01-07 ENCOUNTER — Ambulatory Visit (INDEPENDENT_AMBULATORY_CARE_PROVIDER_SITE_OTHER): Payer: Medicare Other | Admitting: Neurology

## 2013-01-07 VITALS — BP 107/63 | HR 74 | Ht 71.0 in | Wt 251.0 lb

## 2013-01-07 DIAGNOSIS — D539 Nutritional anemia, unspecified: Secondary | ICD-10-CM | POA: Diagnosis not present

## 2013-01-07 DIAGNOSIS — G3184 Mild cognitive impairment, so stated: Secondary | ICD-10-CM | POA: Diagnosis not present

## 2013-01-07 DIAGNOSIS — G609 Hereditary and idiopathic neuropathy, unspecified: Secondary | ICD-10-CM | POA: Insufficient documentation

## 2013-01-07 DIAGNOSIS — R209 Unspecified disturbances of skin sensation: Secondary | ICD-10-CM | POA: Diagnosis not present

## 2013-01-07 DIAGNOSIS — G2581 Restless legs syndrome: Secondary | ICD-10-CM

## 2013-01-07 NOTE — Progress Notes (Signed)
Referal from/Diagnosis given: Dr Elfredia Nevins, memory loss and ? RLS  Dakota Thompson is a pleasant 66y/o gentleman with a >103yr hx of RLS and new onset cognitive changes. He presents today at [facility] for initial evaluation.  Onset of sx: Memory trouble started within the past year. Trouble with short term memory, forgetting where he put his keys, current events. Gets confused trying to multi-task. Long term memory is good. Wife notes she now has to keep track of his schedule. Wife manages the finances but has always been that way. No hallucinations. Wife notes a shorter fuse, gets irritated easier, thinks this is new for him. They are seeing a counselor for this. Patient notes worse depression which started 2 to 4 years ago, also notes anxiety. Trouble falling asleep but states due to RLS symptoms. Light EtOH use. Retired, worked at [employer], planned retirement in 2007. Works as Engineer, water.  Restless leg sensation started around 6yrs ago. Described as sensation of feet sticking together, unable to get comfortable. Initially started in L foot and is L predominant but can involve R leg too. Started after having an accident involving a wire going through his left lower extremity. Happens when relaxed, resting, going to bed. Relieved by moviement and  walking around. Sensation improves but does not completely resolve with movement. Occuring almost nightly. Not occuring earlier in the day, severity is not getting worse. Still feels he gets a benefit from meds. Unsure if he has had iron levels checked.   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 He states he is the only one in his family with this diagnosis but states his brother has similar symptoms but was toldd it was myasthenia gravis.Had sleep study done and was told he has RLS. Initially started on Requip, didn't get much benefit so changed to Mirapex. Currently taking 0.5mg  at 7pm. Started on  Neurontin 44yrs ago, takes 600mg  at bedtime, will sometimes take another tablet later in evening if no improvement in symptoms.    Does note some decreased temperature sensation in his feet, trouble sensing hot/cold, no difficulty with position sense. No history of DM, no CA, no hepatitis, no HIV.    ROS: Constitutional: Denies fever, weight loss, weight gain Eyes:+ blurry vision Denies loss of vision, eye pain CV: Denies chest pain, palpitations, syncope Pulm: Denies SOB, dyspnea, cough GI:  Denies constipation, diarrhea, abdominal pain MSK: Denies spasms, muscle pain, weakness Neuro:+ restless leg sensation Denies HA, vertigo, falls, tremor Psyc: + anxiety Denies depression, hallucinatons, confusion Hem/lymph: Denies easy bleeding, bruising, no swollen nodes Allergic: No runny nose, hives, rashes All other ROS are negative   Physical Exam: Gen: NAD, pleasant CV: RRR no m/r/g Pulm: CTA bilat Abd: +BS, soft, NT, ND  Ext: high arched feet MS: AA&Ox3, appropriately interactive, normal affect   Attention: WORLD backwards  Speech: fluent w/o paraphasic error  Memory: good recent and remote recall. MMSE 28/30  CN: PERRL, EOMI no nystagmus, no ptosis, sensation intact to LT V1-V3 bilat, face symmetric, no weakness, hearing grossly intact, palate elevates symmetrically, shoulder shrug 5/5 bilat,tongue protrudes midline, no fasiculations noted.  Motor: normal bulk and tone except for some atrophy noted in bilat distal LE Strength: 5/5  In all extremities  Coord: rapid alternating and point-to-point (FNF, HTS) movements intact. Refl:  Symmetrical, diminished bilat achilles 1+, bilat downgoing toes  Sens: intact bilat UE to all modalities, bilat feet difficulty with pinprick, diminished temperature, decreased proprioception R >L  toes, diminshed vibration to ankle on R and to midshin on L  Gait: posture, stance, stride and arm-swing normal. Unable to tandem, negative  Rhomberg though sways with eyes closed  Assessment/Plan:  Dakota Thompson is a 66y/o gentleman with an outside diagnosis of RLS and new concern over cognitive difficulties presenting for initial evaluation. He is currently taking a regimen of Mirapex 0.5mg  nightly and neurontin 600 to 900mg  nightly. His cognitive changes are most noteable in short term memory and concentration, though he does score a 28/30 on a MMSE. History is also pertinent for a ? Diagnosis of CMT over 22yrs ago and noteable sensory loss in bilat lower extremities.   1) Restless leg syndrome Patient does note benefit from current medication regimen and does not appear to be having augmentation of RLS. Prior to making any medication adjustments will order iron studies as patient is unsure if this has been done. Due to possible cognitive issues would favor maxing out a monotherapy prior to adding an additional agent. At next visit will discuss options with patient. Would consider the following: -tapering off neurontin and increasing Mirapex slowly up to 2mg  daily as tolerated until benefit noted or could taper down Mirapex and increase Neurontin to 1800 mg as tolerated or switch to Horizant -could consider switching DA to low dose Neupro patch. Would start with 1mg  and consider slow titration up to 2 or 4mg  patch  2)Peripheral Neuropathy -unclear etiology of symptoms -with ? History of CMT will repeat EMG/NCS as last exam >83yrs ago -will check TSH and HbA1c -patient to bring copy of B12 report   3) Mild Cognitive Impairment: MMSE of 28/30 with subjective difficulties with short term memory and concentration -will check B12 and TSH for reversible causes of dementia. No findings on exam currently point to a central process such as underlying mass, infection or NPH. -would consider MOCA or formal neuro-psych testing in the future to better determine extent of cognitive impairment -depression can also mimic dementia which may be partly  contributing in this case. Would monitor closely and consider addition of anti-depressant in the future if indicated.  Follow up in 2 months once EMG/NCS completed  A total of 60 minutes was spent in with this patient. Over half this time was spent on counseling patient on the diagnosis and different therapeutic options available.

## 2013-01-07 NOTE — Patient Instructions (Addendum)
Overall you are doing fairly well but I do want to suggest a few things today:   As far as diagnostic testing: We would like you to have some blood work done. We will check serum TSH levels and serum iron levels. Please bring old lab work with you. We would like to see your level of vitamin B12. We will also be sending you for a EMG/NCS study of bilateral lower extremities for further workup of your neuropathy.  I would like to see you back once the above workup is completed, sooner if we need to. Please call us with any interim questions, concerns, problems, updates or refill requests.   Please also call us for any test results so we can go over those with you on the phone.  My clinical assistant and will answer any of your questions and relay your messages to me and also relay most of my messages to you.   Our phone number is [phone]. We also have an after hours call service for urgent matters and there is a physician on-call for urgent questions. For any emergencies you know to call 911 or go to the nearest emergency room

## 2013-01-08 LAB — IRON AND TIBC: TIBC: 323 ug/dL (ref 250–450)

## 2013-01-10 ENCOUNTER — Encounter: Payer: Self-pay | Admitting: Neurology

## 2013-01-21 ENCOUNTER — Encounter (INDEPENDENT_AMBULATORY_CARE_PROVIDER_SITE_OTHER): Payer: Medicare Other

## 2013-01-21 ENCOUNTER — Ambulatory Visit (INDEPENDENT_AMBULATORY_CARE_PROVIDER_SITE_OTHER): Payer: Medicare Other | Admitting: Neurology

## 2013-01-21 DIAGNOSIS — R209 Unspecified disturbances of skin sensation: Secondary | ICD-10-CM

## 2013-01-21 DIAGNOSIS — G2581 Restless legs syndrome: Secondary | ICD-10-CM | POA: Diagnosis not present

## 2013-01-21 DIAGNOSIS — Z0289 Encounter for other administrative examinations: Secondary | ICD-10-CM

## 2013-01-21 DIAGNOSIS — G609 Hereditary and idiopathic neuropathy, unspecified: Secondary | ICD-10-CM

## 2013-01-21 NOTE — Procedures (Signed)
  HISTORY:  Dakota Thompson is a 67 year old gentleman with a history of significant restless leg syndrome affecting the left greater than right lower extremity. 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 The patient denies any low back pain or pain radiating down the legs.  NERVE CONDUCTION STUDIES:  Nerve conduction studies were performed on both lower extremities. The distal motor latencies and motor amplitudes for the peroneal and posterior tibial nerves were within normal limits. The nerve conduction velocities for these nerves were also normal. The H reflex latencies were normal. The sensory latencies for the peroneal nerves were within normal limits.   EMG STUDIES:  EMG study was performed on the left lower extremity:  The tibialis anterior muscle reveals 2 to 4K motor units with full recruitment. No fibrillations or positive waves were seen. The peroneus tertius muscle reveals 2 to 5K motor units with full recruitment. No fibrillations or positive waves were seen. The medial gastrocnemius muscle reveals 1 to 3K motor units with full recruitment. No fibrillations or positive waves were seen. The vastus lateralis muscle reveals 2 to 4K motor units with full recruitment. No fibrillations or positive waves were seen. The iliopsoas muscle reveals 2 to 4K motor units with full recruitment. No fibrillations or positive waves were seen. The biceps femoris muscle (long head) reveals 2 to 4K motor units with full recruitment. No fibrillations or positive waves were seen. The lumbosacral paraspinal muscles were tested at 3 levels, and revealed no abnormalities of insertional activity at all 3 levels tested. There was good relaxation.   IMPRESSION:  Nerve conduction studies done on both lower extremities were within normal limits. There is no evidence of a peripheral neuropathy on this evaluation. A small fiber neuropathy  may be missed by standard nerve conduction studies, and clinical correlation is required. EMG evaluation of the left lower extremity is unremarkable, without evidence of an overlying lumbosacral radiculopathy.  Marlan Palau MD 01/21/2013 4:15 PM  [facility] [address] [place], [place] [address]  Phone [phone] Fax [phone]

## 2013-01-27 ENCOUNTER — Telehealth: Payer: Self-pay | Admitting: *Deleted

## 2013-01-27 NOTE — Telephone Encounter (Signed)
Spoke to patient and he is rescheduled for 02-11-13.

## 2013-01-27 NOTE — Telephone Encounter (Signed)
Called patient to discuss appointment that he is scheduled for tomorrow. It needs to be rescheduled.

## 2013-01-28 ENCOUNTER — Ambulatory Visit: Payer: Medicare Other | Admitting: Neurology

## 2013-02-11 ENCOUNTER — Ambulatory Visit (INDEPENDENT_AMBULATORY_CARE_PROVIDER_SITE_OTHER): Payer: Medicare Other | Admitting: Neurology

## 2013-02-11 ENCOUNTER — Encounter: Payer: Self-pay | Admitting: Neurology

## 2013-02-11 VITALS — BP 112/69 | HR 80 | Ht 71.0 in | Wt 256.0 lb

## 2013-02-11 DIAGNOSIS — G2581 Restless legs syndrome: Secondary | ICD-10-CM

## 2013-02-11 DIAGNOSIS — G3184 Mild cognitive impairment, so stated: Secondary | ICD-10-CM

## 2013-02-11 DIAGNOSIS — G609 Hereditary and idiopathic neuropathy, unspecified: Secondary | ICD-10-CM

## 2013-02-11 NOTE — Progress Notes (Signed)
Referring Provider: Elfredia Nevins, MD Primary Care Physician:  Dakota Thompson., MD  CC:  RLS  HPI:  Dakota Thompson is a 67 y.o. male here as a follow up of his RLS symptoms. Last visit 01/07/2013. Since last visit he has had an EMG/NCS which was unremarkable and lab work for neuropathy and RLS which was unremarkable.   Overall: Doing well since his last visit. Had EMG nerve conduction study done which was unremarkable showed no signs of Charcot-Marie-Tooth. Continues to have symptoms consistent with peripheral neuropathy and a restless leg syndrome. Feels these are stable and have not progressed since his last visit. Continues on Neurontin 600 mg nightly and Mirapex 0.5 mg nightly. He will also take additional Neurontin 300 mg later in the evening if needed. Denies any impulse control or other adverse effects with Mirapex. He expresses interested in trying to taper off one of the 2 medications and does be on monotherapy. The patient and his wife also bring up a potential physical therapy option, called rebuilder. This was adjusted per their son a physical therapist and has been shown to be beneficial with neuropathy. This is available at a physical therapist in [place] and they're requesting a prescription.   rebuilder machineOut of a complete 14 system review, the patient complains of only the following symptoms, and all other reviewed systems are negative. Denies any positive review of systems  History   Social History  . Marital Status: Married    Spouse Name: Dakota Thompson     Number of Children: 2  . Years of Education: N/A   Occupational History  . Retired    Social History Main Topics  . Smoking status: Former Smoker    Types: Cigarettes    Quit date: 05/22/1981  . Smokeless tobacco: Never Used  . Alcohol Use: No  . Drug Use: No  . Sexual Activity: Not on file   Other Topics Concern  . Not on file   Social History Narrative   Patient lives at home with his wife.    Patient  has 2 children.    Patient is retired.     History reviewed. No pertinent family history.  Past Medical History  Diagnosis Date  . Hypertension   . Pancytopenia     mild  . Charcot-Marie-Tooth disease     hx  . Restless leg syndrome   . Viral syndrome     Hx  . Pancytopenia 02/27/2011  . Viral syndrome 02/27/2011  . Thyroid disease     "normal now"    Past Surgical History  Procedure Laterality Date  . Ganglion cyst excision    . Nasal sinus surgery    . Knee cartilage repair  01/31/11    Current Outpatient Prescriptions  Medication Sig Dispense Refill  . aspirin 81 MG tablet Take 81 mg by mouth daily.        . calcium carbonate (TUMS - DOSED IN MG ELEMENTAL CALCIUM) 500 MG chewable tablet Chew 1 tablet by mouth as needed.        . celecoxib (CELEBREX) 100 MG capsule Take 100 mg by mouth 2 (two) times daily.       . Coenzyme Q10 (CO Q 10) 100 MG CAPS Take 100 mg by mouth daily. Patient takes as he remembers      . Docusate Calcium (STOOL SOFTENER PO) Take by mouth 2 (two) times daily. Takes 2 in the morning.      . gabapentin (NEURONTIN) 300 MG capsule Take 600  mg by mouth at bedtime. Take 900 mg as needed      . Multiple Vitamins-Minerals (CENTRUM SILVER PO) Take by mouth.        . olmesartan (BENICAR) 40 MG tablet Take 40 mg by mouth daily.        . Omega-3 Fatty Acids (FISH OIL) 1000 MG CAPS Take 1,200 mg by mouth 2 (two) times daily. Take 2 capsules by mouth in the morning.      Marland Kitchen omeprazole (PRILOSEC) 20 MG capsule Take 20 mg by mouth 2 (two) times daily.        . pramipexole (MIRAPEX) 0.25 MG tablet Take 0.25 mg by mouth. 2 @ HS       No current facility-administered medications for this visit.    Allergies as of 02/11/2013  . (No Known Allergies)    Vitals: BP 112/69  Pulse 80  Ht 5\' 11"  (1.803 m)  Wt 256 lb (116.121 kg)  BMI 35.72 kg/m2 Last Weight:  Wt Readings from Last 1 Encounters:  02/11/13 256 lb (116.121 kg)   Last Height:   Ht Readings from Last  1 Encounters:  02/11/13 5\' 11"  (1.803 m)    Physical Exam: Gen: NAD, pleasant CV: RRR no m/r/g Pulm: CTA bilat Abd: +BS, soft, NT, ND  Ext: high arched feet MS: AA&Ox3, appropriately interactive, normal affect   Speech: fluent w/o paraphasic error  Memory: good recent and remote recall. MOCA 27/30 -3 delayed recall   CN: PERRL, EOMI no nystagmus, no ptosis, sensation intact to LT V1-V3 bilat, face symmetric, no weakness, hearing grossly intact, palate elevates symmetrically, shoulder shrug 5/5 bilat,tongue protrudes midline, no fasiculations noted.  Motor: normal bulk and tone except for some atrophy noted in bilat distal LE Strength: 5/5  In all extremities  Coord: rapid alternating and point-to-point (FNF, HTS) movements intact. Refl:  Symmetrical, absent R patellar reflex, diminished bilat achilles 1+, bilat downgoing toes  Sens: intact bilat UE to all modalities, bilat feet difficulty with pinprick, diminished temperature, decreased proprioception R >L toes, diminshed vibration to ankle on R and to midshin on L  Gait: posture, stance, stride and arm-swing normal. Unable to tandem, negative Rhomberg though sways with eyes closed  Assessment/Plan:  Mr Dakota Thompson is a 67y/o gentleman with an outside diagnosis of RLS andconcern over cognitive difficulties presenting for follow up evaluation. He is currently taking a regimen of Mirapex 0.5mg  nightly and neurontin 600 to 900mg  nightly. His cognitive function appears stable with a MOCA core of 27/30 which is similar to previous visit Mini-Mental status exam of 20/30. His EMG nerve conduction study was unremarkable.  1) Restless leg syndrome Patient does note benefit from current medication regimen and does not appear to be having augmentation of RLS. Per discussion with the patient will switch to monotherapy. After discussion of risks and benefit will taper off Mirapex and continue with gabapentin  -Continue with gabapentin 600 mg,  will take this at 5 PM. Patient has option of taking additional 1-2  Capsules around 8 or 9 PM if needed -Taper off Mirapex per schedule given to the patient  -could consider switching DA to low dose Neupro patch. Would start with 1mg  and consider slow titration up to 2 or 4mg  patch or consider trial of Horizant if no benefit noted  2)Peripheral Neuropathy -Referral given for "rebuilder: apparatus via physical therapy in [place] - Continuing with gabapentin nightly  3) Mild Cognitive Impairment: -Will continue to monitor at this time as is within the realm  of normal limits  -If cognitive function worsens could consider addition of cognitive enhancer in the future  -depression can also mimic dementia which may be partly contributing in this case. Would monitor closely and consider addition of anti-depressant in the future if indicated.  Follow up in 6 months or earlier as needed

## 2013-02-11 NOTE — Patient Instructions (Addendum)
Overall you are doing fairly well but I do want to suggest a few things today:   Remember to drink plenty of fluid, eat healthy meals and do not skip any meals. Try to eat protein with a every meal and eat a healthy snack such as fruit or nuts in between meals. Try to keep a regular sleep-wake schedule and try to exercise daily, particularly in the form of walking, 20-30 minutes a day, if you can.   As far as your medications are concerned, I would like to suggest changing the schedule of your gabapentin: Please take 2 capsules at 5pm and then if needed take an additional 1-2 capsules around 8-9pm.  We are going to taper you off of the Pramipexole using the following schedule: -Week 1: Take one tablet of 0.25mg  nightly -Week 2: Take one tablet 0.25mg  every other night -Week 3: Discontinue use of Pramipexole and just take Gabapentin  I gave you a referral for physical therapy and the Rebuilder device  I would like to see you back in 6 months, sooner if we need to. Please call us with any interim questions, concerns, problems, updates or refill requests.   Please also call us for any test results so we can go over those with you on the phone.  My clinical assistant and will answer any of your questions and relay your messages to me and also relay most of my messages to you.   Our phone number is [phone]. We also have an after hours call service for urgent matters and there is a physician on-call for urgent questions. For any emergencies you know to call 911 or go to the nearest emergency room

## 2013-02-16 ENCOUNTER — Telehealth: Payer: Self-pay | Admitting: *Deleted

## 2013-02-16 NOTE — Telephone Encounter (Signed)
Clarisse Gouge is calling about order for PT and rebuilder.  She will send an order form which states the information for the rebuilder.  (PT eval then 2 wk with rebuilder and then if gain is noticed will proceed with the strength and balance.  (she relayed that for reimbursement pt needs to have 5 wks PT) relating to [facility].

## 2013-02-23 DIAGNOSIS — M549 Dorsalgia, unspecified: Secondary | ICD-10-CM | POA: Diagnosis not present

## 2013-02-24 DIAGNOSIS — G589 Mononeuropathy, unspecified: Secondary | ICD-10-CM | POA: Diagnosis not present

## 2013-02-24 DIAGNOSIS — IMO0002 Reserved for concepts with insufficient information to code with codable children: Secondary | ICD-10-CM | POA: Diagnosis not present

## 2013-03-01 DIAGNOSIS — M549 Dorsalgia, unspecified: Secondary | ICD-10-CM | POA: Diagnosis not present

## 2013-03-01 DIAGNOSIS — G589 Mononeuropathy, unspecified: Secondary | ICD-10-CM | POA: Diagnosis not present

## 2013-03-03 DIAGNOSIS — M549 Dorsalgia, unspecified: Secondary | ICD-10-CM | POA: Diagnosis not present

## 2013-03-03 DIAGNOSIS — IMO0002 Reserved for concepts with insufficient information to code with codable children: Secondary | ICD-10-CM | POA: Diagnosis not present

## 2013-03-08 DIAGNOSIS — M549 Dorsalgia, unspecified: Secondary | ICD-10-CM | POA: Diagnosis not present

## 2013-03-08 DIAGNOSIS — G589 Mononeuropathy, unspecified: Secondary | ICD-10-CM | POA: Diagnosis not present

## 2013-04-01 DIAGNOSIS — Z23 Encounter for immunization: Secondary | ICD-10-CM | POA: Diagnosis not present

## 2013-05-30 DIAGNOSIS — M159 Polyosteoarthritis, unspecified: Secondary | ICD-10-CM | POA: Diagnosis not present

## 2013-05-30 DIAGNOSIS — K5732 Diverticulitis of large intestine without perforation or abscess without bleeding: Secondary | ICD-10-CM | POA: Diagnosis not present

## 2013-05-30 DIAGNOSIS — I10 Essential (primary) hypertension: Secondary | ICD-10-CM | POA: Diagnosis not present

## 2013-05-30 DIAGNOSIS — Z6836 Body mass index (BMI) 36.0-36.9, adult: Secondary | ICD-10-CM | POA: Diagnosis not present

## 2013-05-30 DIAGNOSIS — E039 Hypothyroidism, unspecified: Secondary | ICD-10-CM | POA: Diagnosis not present

## 2013-07-01 DIAGNOSIS — Z79899 Other long term (current) drug therapy: Secondary | ICD-10-CM | POA: Diagnosis not present

## 2013-07-01 DIAGNOSIS — Z6836 Body mass index (BMI) 36.0-36.9, adult: Secondary | ICD-10-CM | POA: Diagnosis not present

## 2013-07-01 DIAGNOSIS — R7309 Other abnormal glucose: Secondary | ICD-10-CM | POA: Diagnosis not present

## 2013-07-01 DIAGNOSIS — M159 Polyosteoarthritis, unspecified: Secondary | ICD-10-CM | POA: Diagnosis not present

## 2013-07-01 DIAGNOSIS — I10 Essential (primary) hypertension: Secondary | ICD-10-CM | POA: Diagnosis not present

## 2013-07-01 DIAGNOSIS — Z125 Encounter for screening for malignant neoplasm of prostate: Secondary | ICD-10-CM | POA: Diagnosis not present

## 2013-07-01 DIAGNOSIS — G589 Mononeuropathy, unspecified: Secondary | ICD-10-CM | POA: Diagnosis not present

## 2013-07-01 DIAGNOSIS — Z0000 Encounter for general adult medical examination without abnormal findings: Secondary | ICD-10-CM | POA: Diagnosis not present

## 2013-07-25 DIAGNOSIS — H2510 Age-related nuclear cataract, unspecified eye: Secondary | ICD-10-CM | POA: Diagnosis not present

## 2013-07-25 DIAGNOSIS — H40039 Anatomical narrow angle, unspecified eye: Secondary | ICD-10-CM | POA: Diagnosis not present

## 2013-08-12 ENCOUNTER — Ambulatory Visit (INDEPENDENT_AMBULATORY_CARE_PROVIDER_SITE_OTHER): Payer: Medicare Other | Admitting: Neurology

## 2013-08-12 ENCOUNTER — Encounter (INDEPENDENT_AMBULATORY_CARE_PROVIDER_SITE_OTHER): Payer: Self-pay

## 2013-08-12 ENCOUNTER — Encounter: Payer: Self-pay | Admitting: Neurology

## 2013-08-12 VITALS — BP 121/63 | HR 79 | Ht 71.0 in | Wt 253.0 lb

## 2013-08-12 DIAGNOSIS — G609 Hereditary and idiopathic neuropathy, unspecified: Secondary | ICD-10-CM

## 2013-08-12 DIAGNOSIS — G2581 Restless legs syndrome: Secondary | ICD-10-CM | POA: Diagnosis not present

## 2013-08-12 DIAGNOSIS — G4733 Obstructive sleep apnea (adult) (pediatric): Secondary | ICD-10-CM | POA: Diagnosis not present

## 2013-08-12 NOTE — Progress Notes (Signed)
Referring Provider: Redmond School, MD Primary Care Physician:  Dakota Thompson., MD  CC:  RLS  HPI:  Dakota Thompson is a 68 y.o. male here as a follow up of his RLS symptoms. Last visit 01/2013. At that time he was doing well overall. Since last visit he has worked with PT and a device for neuropathy. He was tapered off the Mirapex and kept on gabapentin monotherapy. He feels that the neuropathy stimulator was giving him some benefit but insurance did not cover it.   Tolerating off the Mirapex well. Continues on the Gabapentin 600mg  nightly as needed, will occasionally take an additional 300mg  overnight as needed. Feels the peripheral neuropathy has stabilized.   Started Metanx by Dr Dakota Thompson, feels this has helped him. He is unclear why this was started but does note some benefit.    Continues to note difficulty with memory, predominantly is a difficulty with short term memory. Remote memory remains intact. Wife notes that he snores in his sleep, has apnea events. Has history of deviated nasal septum, patient has been evaluated by ENT in the past.   Prior visit 01/2013: Doing well since his last visit. Had EMG nerve conduction study done which was unremarkable showed no signs of Charcot-Marie-Tooth. Continues to have symptoms consistent with peripheral neuropathy and a restless leg syndrome. Feels these are stable and have not progressed since his last visit. Continues on Neurontin 600 mg nightly and Mirapex 0.5 mg nightly. He will also take additional Neurontin 300 mg later in the evening if needed. Denies any impulse control or other adverse effects with Mirapex. He expresses interested in trying to taper off one of the 2 medications and does be on monotherapy. The patient and his wife also bring up a potential physical therapy option, called rebuilder. This was adjusted per their son a physical therapist and has been shown to be beneficial with neuropathy. This is available at a physical therapist in  [place] and they're requesting a prescription.   Out of a complete 14 system review, the patient complains of only the following symptoms, and all other reviewed systems are negative. Denies any positive review of systems  History   Social History  . Marital Status: Married    Spouse Name: Dakota Thompson     Number of Children: 2  . Years of Education: N/A   Occupational History  . Retired    Social History Main Topics  . Smoking status: Former Smoker    Types: Cigarettes    Quit date: 05/22/1981  . Smokeless tobacco: Never Used  . Alcohol Use: No  . Drug Use: No  . Sexual Activity: Not on file   Other Topics Concern  . Not on file   Social History Narrative   Patient lives at home with his wife.    Patient has 2 children.    Patient is retired.     No family history on file.  Past Medical History  Diagnosis Date  . Hypertension   . Pancytopenia     mild  . Charcot-Marie-Tooth disease     hx  . Restless leg syndrome   . Viral syndrome     Hx  . Pancytopenia 02/27/2011  . Viral syndrome 02/27/2011  . Thyroid disease     "normal now"    Past Surgical History  Procedure Laterality Date  . Ganglion cyst excision    . Nasal sinus surgery    . Knee cartilage repair  01/31/11    Current Outpatient Prescriptions  Medication Sig Dispense Refill  . aspirin 81 MG tablet Take 81 mg by mouth daily.        . calcium carbonate (TUMS - DOSED IN MG ELEMENTAL CALCIUM) 500 MG chewable tablet Chew 1 tablet by mouth as needed.        . celecoxib (CELEBREX) 100 MG capsule Take 100 mg by mouth 2 (two) times daily.       Dakota Thompson Calcium (STOOL SOFTENER PO) Take by mouth 2 (two) times daily. Takes 2 in the morning.      . gabapentin (NEURONTIN) 300 MG capsule Take 600 mg by mouth at bedtime. Take 900 mg as needed      . L-Methylfolate-B6-B12 (METANX PO) Take by mouth.      . Multiple Vitamins-Minerals (CENTRUM SILVER PO) Take by mouth.        . olmesartan (BENICAR) 40 MG tablet  Take 40 mg by mouth daily.        . Omega-3 Fatty Acids (FISH OIL) 1000 MG CAPS Take 1,200 mg by mouth 2 (two) times daily. Take 2 capsules by mouth in the morning.      Marland Kitchen omeprazole (PRILOSEC) 20 MG capsule Take 20 mg by mouth 2 (two) times daily.        . pravastatin (PRAVACHOL) 20 MG tablet Take 20 mg by mouth daily.       No current facility-administered medications for this visit.    Allergies as of 08/12/2013  . (No Known Allergies)    Vitals: BP 121/63  Pulse 79  Ht 5\' 11"  (1.803 m)  Wt 253 lb (114.76 kg)  BMI 35.30 kg/m2 Last Weight:  Wt Readings from Last 1 Encounters:  08/12/13 253 lb (114.76 kg)   Last Height:   Ht Readings from Last 1 Encounters:  08/12/13 5\' 11"  (1.803 m)    Physical Exam: Gen: NAD, pleasant CV: RRR no m/r/g Pulm: CTA bilat Abd: +BS, soft, NT, ND  Ext: high arched feet MS: AA&Ox3, appropriately interactive, normal affect   Speech: fluent w/o paraphasic error  Memory: good recent and remote recall. MOCA 27/30 at prior visit -3 delayed recall   CN: PERRL, EOMI no nystagmus, no ptosis, sensation intact to LT V1-V3 bilat, face symmetric, no weakness, hearing grossly intact, palate elevates symmetrically, shoulder shrug 5/5 bilat,tongue protrudes midline, no fasiculations noted.  Motor: normal bulk and tone except for some atrophy noted in bilat distal LE Strength: 5/5  In all extremities  Coord: rapid alternating and point-to-point (FNF, HTS) movements intact. Refl:  Symmetrical, absent R patellar reflex, diminished bilat achilles 1+, bilat downgoing toes  Sens: intact bilat UE to all modalities, bilat feet difficulty with pinprick, diminished temperature, decreased proprioception R >L toes, diminshed vibration to ankle on R and to midshin on L  Gait: posture, stance, stride and arm-swing normal. Unable to tandem, negative Rhomberg though sways with eyes closed  Assessment/Plan:  Dakota Thompson is a 67y/o gentleman with an outside  diagnosis of RLS andconcern over cognitive difficulties presenting for follow up evaluation. He is currently taking a regimen of Mirapex 0.5mg  nightly and neurontin 600 to 900mg  nightly. His cognitive function appears stable with a MOCA core of 27/30 which is similar to previous visit Mini-Mental status exam of 20/30. His EMG nerve conduction study was unremarkable.  1) Restless leg syndrome  -Continue with gabapentin 600 mg, will take this at 5 PM. Patient has option of taking additional 1-2  Capsules around 8 or 9 PM if needed  2)Peripheral  Neuropathy - Continuing with gabapentin nightly -due to cost, will try off Metanx for 1 month, if worsening of symptoms will restart. Instructed to notify Dr Dakota Thompson prior to stopping medication  3) Mild Cognitive Impairment: -Will continue to monitor at this time as is within the realm of normal limits  -suspect OSA may be contributing to symptoms. Will refer for sleep study   Follow up in 6 months or earlier as needed

## 2013-08-12 NOTE — Patient Instructions (Signed)
Overall you are doing fairly well but I do want to suggest a few things today:   Remember to drink plenty of fluid, eat healthy meals and do not skip any meals. Try to eat protein with a every meal and eat a healthy snack such as fruit or nuts in between meals. Try to keep a regular sleep-wake schedule and try to exercise daily, particularly in the form of walking, 20-30 minutes a day, if you can.   As far as your medications are concerned, I would like to suggest the following: 1)Continue the gabapentin as instructed 2)Try discontinuing the Metanx for 1 month to see if you notice a difference  As far as diagnostic testing I would like you to be evaluated for sleep apnea. Someone will give you a call to schedule a sleep study.   I would like to see you back in 6 months, sooner if we need to. Please call us with any interim questions, concerns, problems, updates or refill requests.   My clinical assistant and will answer any of your questions and relay your messages to me and also relay most of my messages to you.   Our phone number is [phone]. We also have an after hours call service for urgent matters and there is a physician on-call for urgent questions. For any emergencies you know to call 911 or go to the nearest emergency room

## 2013-08-19 ENCOUNTER — Telehealth: Payer: Self-pay | Admitting: Neurology

## 2013-08-19 DIAGNOSIS — G4733 Obstructive sleep apnea (adult) (pediatric): Secondary | ICD-10-CM

## 2013-08-19 DIAGNOSIS — E669 Obesity, unspecified: Secondary | ICD-10-CM

## 2013-08-19 DIAGNOSIS — R400 Somnolence: Secondary | ICD-10-CM

## 2013-08-19 NOTE — Telephone Encounter (Signed)
This patient is referred by Dr. Janann Colonel for snoring, excessive daytime somnolence and witnessed apneas. He has an underlying history of obesity as well. After reviewing the sleep study referral, I entered a split night sleep study request on this patient, thanks.  Star Age, MD, PhD [facility])

## 2013-08-19 NOTE — Telephone Encounter (Signed)
Dr. Jim Like, refers patient for attended sleep study.  Height: 5'11  Weight: 253 lbs.  BMI: 35.30  Past Medical History:  Hypertension  Pancytopenia  mild  Charcot-Marie-Tooth disease  hx  Restless leg syndrome  Viral syndrome  Hx  Pancytopenia  02/27/2011  Viral syndrome  02/27/2011  Thyroid disease  "normal now"    Sleep Symptoms:  Wife notes that he snores in his sleep, has apnea events. Has history of deviated nasal septum, patient has been evaluated by ENT in the past.    Epworth Score:  15  Medications: Aspirin (Tab) aspirin 81 MG Take 81 mg by mouth daily. Calcium Carbonate Antacid (Chew Tab) TUMS - dosed in mg elemental calcium 500 MG Chew 1 tablet by mouth as needed. Celecoxib (Cap) CELEBREX 100 MG Take 100 mg by mouth 2 (two) times daily. Docusate Calcium STOOL SOFTENER PO Take by mouth 2 (two) times daily. Takes 2 in the morning. Gabapentin (Cap) NEURONTIN 300 MG Take 600 mg by mouth at bedtime. Take 900 mg as needed L-Methylfolate-B6-B12 METANX PO Take by mouth. Multiple Vitamins-Minerals CENTRUM SILVER PO Take by mouth. Olmesartan Medoxomil (Tab) BENICAR 40 MG Take 40 mg by mouth daily. Omega-3 Fatty Acids (Cap) Fish Oil 1000 MG Take 1,200 mg by mouth 2 (two) times daily. Take 2 capsules by mouth in the morning. Omeprazole (Capsule Delayed Release) PRILOSEC 20 MG Take 20 mg by mouth 2 (two) times daily. Pravastatin Sodium (Tab) PRAVACHOL 20 MG Take 20 mg by mouth daily.   Insurance: Medicare/BCBS  Please review patient information and submit instructions for scheduling and orders for sleep technologist.  Thank you!

## 2013-09-01 ENCOUNTER — Ambulatory Visit (INDEPENDENT_AMBULATORY_CARE_PROVIDER_SITE_OTHER): Payer: Medicare Other | Admitting: Otolaryngology

## 2013-09-01 DIAGNOSIS — H903 Sensorineural hearing loss, bilateral: Secondary | ICD-10-CM

## 2013-09-01 DIAGNOSIS — J343 Hypertrophy of nasal turbinates: Secondary | ICD-10-CM | POA: Diagnosis not present

## 2013-09-01 DIAGNOSIS — H6980 Other specified disorders of Eustachian tube, unspecified ear: Secondary | ICD-10-CM

## 2013-09-01 DIAGNOSIS — J310 Chronic rhinitis: Secondary | ICD-10-CM

## 2013-09-06 ENCOUNTER — Ambulatory Visit (INDEPENDENT_AMBULATORY_CARE_PROVIDER_SITE_OTHER): Payer: Medicare Other

## 2013-09-06 DIAGNOSIS — E669 Obesity, unspecified: Secondary | ICD-10-CM

## 2013-09-06 DIAGNOSIS — G4761 Periodic limb movement disorder: Secondary | ICD-10-CM | POA: Diagnosis not present

## 2013-09-06 DIAGNOSIS — G4733 Obstructive sleep apnea (adult) (pediatric): Secondary | ICD-10-CM

## 2013-09-06 DIAGNOSIS — R400 Somnolence: Secondary | ICD-10-CM

## 2013-09-16 ENCOUNTER — Telehealth: Payer: Self-pay | Admitting: Neurology

## 2013-09-16 DIAGNOSIS — G4761 Periodic limb movement disorder: Secondary | ICD-10-CM

## 2013-09-16 DIAGNOSIS — G4733 Obstructive sleep apnea (adult) (pediatric): Secondary | ICD-10-CM

## 2013-09-16 DIAGNOSIS — G2581 Restless legs syndrome: Secondary | ICD-10-CM

## 2013-09-16 DIAGNOSIS — E669 Obesity, unspecified: Secondary | ICD-10-CM

## 2013-09-16 DIAGNOSIS — R400 Somnolence: Secondary | ICD-10-CM

## 2013-09-16 NOTE — Telephone Encounter (Signed)
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 This will require a repeat sleep study for proper titration and mask fitting. Please explain to patient and arrange for a CPAP titration study. I have placed an order in the chart. Thanks, Jackie Russman, MD, PhD [facility] ([facility])  

## 2013-09-19 ENCOUNTER — Encounter: Payer: Self-pay | Admitting: *Deleted

## 2013-09-19 NOTE — Telephone Encounter (Signed)
I called and spoke with the patient about his recent sleep study results. I informed the patient that the recent study did confirm the diagnosis of obstructive sleep apnea and that Dr. Rexene Alberts recommends the treatment in the form of CPAP. I informed the patient that this will require a repeat study for the proper titration and mask fitting. Patient understood and is scheduled for October 06, 2013 at 8:00 pm. I will fax a copy of the report to Dr. Janann Colonel and Dr. Gerarda Fraction offices and mail a copy to the patient.

## 2013-10-06 ENCOUNTER — Ambulatory Visit (INDEPENDENT_AMBULATORY_CARE_PROVIDER_SITE_OTHER): Payer: Medicare Other

## 2013-10-06 DIAGNOSIS — G479 Sleep disorder, unspecified: Secondary | ICD-10-CM

## 2013-10-06 DIAGNOSIS — E669 Obesity, unspecified: Secondary | ICD-10-CM

## 2013-10-06 DIAGNOSIS — G4733 Obstructive sleep apnea (adult) (pediatric): Secondary | ICD-10-CM | POA: Diagnosis not present

## 2013-10-06 DIAGNOSIS — G4761 Periodic limb movement disorder: Secondary | ICD-10-CM | POA: Diagnosis not present

## 2013-10-06 DIAGNOSIS — R400 Somnolence: Secondary | ICD-10-CM

## 2013-10-06 DIAGNOSIS — G2581 Restless legs syndrome: Secondary | ICD-10-CM

## 2013-10-26 ENCOUNTER — Telehealth: Payer: Self-pay | Admitting: Neurology

## 2013-10-26 DIAGNOSIS — G4733 Obstructive sleep apnea (adult) (pediatric): Secondary | ICD-10-CM

## 2013-10-26 NOTE — Telephone Encounter (Signed)
Please call and inform patient that I have entered an order for treatment with PAP. He did well during the latest sleep study with BiPAP. We will, therefore, arrange for a machine for home use through a DME (durable medical equipment) company of His choice; and I will see the patient back in follow-up in about 6 weeks. Please also explain to the patient that I will be looking out for compliance data downloaded from the machine, which can be done remotely through a modem at times or stored on an SD card in the back of the machine. At the time of the followup appointment we will discuss sleep study results and how it is going with PAP treatment at home. Please advise patient to bring His machine at the time of the visit; at least for the first visit, even though this is cumbersome. Bringing the machine for every visit after that may not be needed, but often helps for the first visit. Please also make sure, the patient has a follow-up appointment with me in about 6 weeks from the setup date, thanks.   Star Age, MD, PhD [facility])

## 2013-10-27 ENCOUNTER — Encounter: Payer: Self-pay | Admitting: *Deleted

## 2013-10-27 NOTE — Telephone Encounter (Signed)
I called and spoke with the patient about his recent CPAP titration study results. I informed the patient that he did well on BiPAP during the night of his study and therefore Dr. Rexene Alberts recommend starting BiPAP therapy at home. Patient stated he wants his BiPAP order to sent to [facility] in [place], [place]. I will fax a copy of the report to Dr. Janann Colonel and Dr. Gerarda Fraction offices and mail a copy to the patient along with a follow up instruction letter.

## 2013-12-30 ENCOUNTER — Ambulatory Visit (INDEPENDENT_AMBULATORY_CARE_PROVIDER_SITE_OTHER): Payer: Medicare Other | Admitting: Neurology

## 2013-12-30 ENCOUNTER — Encounter: Payer: Self-pay | Admitting: Neurology

## 2013-12-30 ENCOUNTER — Encounter (INDEPENDENT_AMBULATORY_CARE_PROVIDER_SITE_OTHER): Payer: Self-pay

## 2013-12-30 VITALS — BP 118/70 | HR 64 | Temp 97.6°F | Ht 71.0 in | Wt 250.0 lb

## 2013-12-30 DIAGNOSIS — E669 Obesity, unspecified: Secondary | ICD-10-CM | POA: Diagnosis not present

## 2013-12-30 DIAGNOSIS — G3184 Mild cognitive impairment, so stated: Secondary | ICD-10-CM

## 2013-12-30 DIAGNOSIS — Z9989 Dependence on other enabling machines and devices: Principal | ICD-10-CM

## 2013-12-30 DIAGNOSIS — G4733 Obstructive sleep apnea (adult) (pediatric): Secondary | ICD-10-CM

## 2013-12-30 DIAGNOSIS — G2581 Restless legs syndrome: Secondary | ICD-10-CM | POA: Diagnosis not present

## 2013-12-30 DIAGNOSIS — G609 Hereditary and idiopathic neuropathy, unspecified: Secondary | ICD-10-CM

## 2013-12-30 DIAGNOSIS — G4761 Periodic limb movement disorder: Secondary | ICD-10-CM

## 2013-12-30 NOTE — Progress Notes (Signed)
Subjective:    Patient ID: Dakota Thompson is a 68 y.o. male.  HPI    Star Age, MD, PhD [facility] [address] P.O. [place], [place] [address]  Dear Dakota Thompson,  I saw your patient, Dakota Thompson, upon your kind request in my clinic today for initial consultation of his sleep disorder and after his recent sleep studies. The patient is accompanied by his wife today. As you know, Dakota Thompson is a 68 year old right-handed gentleman with an underlying medical history of neuropathy, obesity and restless leg syndrome, who reported snoring and witnessed apneas as well as daytime somnolence. He had a baseline sleep study on 09/06/2013 as well as a CPAP titration study on 10/06/2013 and a went over his test results with him in detail today. His baseline sleep study from 09/06/2013 showed a sleep efficiency was reduced at 66.6% with a latency to sleep of 13.5 minutes and wake after sleep onset of 131 minutes with moderate sleep fragmentation noted. He had an elevated arousal index. He had an increased percentage of stage II sleep, 5.6% of slow-wave sleep, and 19.1% of REM sleep with a significantly reduced REM latency of 2 minutes. He had mild periodic leg movements at 17.9 per hour resulting in 4 arousals per hour. He had mild to moderate snoring. He did not achieve much in the way of supine sleep reporting that he's not able to sleep on his back. He had one central apnea and 25 obstructive hypopneas with a total AHI of 5.4 per hour, rising to 9.8 per hour in REM sleep. Baseline oxygen saturation was only 90%, nadir was 84%. Time below 88% saturation was 8 minutes and 18 seconds. He was then requested to come back for a CPAP titration study to help his sleep disordered breathing and in light of his significant desaturations. He had a CPAP titration study on 10/06/2013 which showed a sleep efficiency of 65.4% with a prolonged sleep latency of 59.5 minutes and wake after sleep  onset of 106.5 minutes with moderate sleep fragmentation noted. He had an elevated arousal index at 13.4 per hour primarily because of spontaneous arousals. He had an increased percentage of light stage sleep, 1.6% of slow-wave sleep, and a mildly decreased percentage of REM sleep at 17.5% with a prolonged REM latency. He had mild PLMS a 10.7 per hour with an associated arousal index of 1.7 per hour. Baseline oxygen saturation was 91%, nadir was 88%. He was started on CPAP at 5 cm. However he was not able to tolerate this and felt panicked. He did not tolerate the nasal pillows mask or nasal mask and did somewhat better with a full facemask. He was switched to BiPAP for better tolerance of the pressures were titrated from 8/4 cm to 12/8 cm. His oxygen saturation appeared to be better on the final setting of 12/8 cm. REM sleep was achieved on the final pressure but very little supine sleep was achieved during the study. His AHI was 0 on the final pressure. Based on the test results I prescribed BiPAP for him.   Today, I reviewed the patient's CPAP compliance data from 11/02/13 to 12/01/2013, which is a total of 30 days, during which time the patient used BiPAP.  The average usage for all days was 6 hours and 29 minutes. The percent used days greater than 4 hours was 100 %, indicating superb compliance. The residual AHI was 2.1 per hour, indicating an adequate treatment pressure of 12/8 cwp  with low leak reported. He has RLS symptoms and his wife reports that he sleeps better with BiPAP and kicks less in his sleep.   His typical bedtime is reported to be around 11 PM. He reports that he does not really like using his BiPAP but he is getting used to it. Provides report he seems to sleep more soundly and is less restless in his sleep. He has not noted any improvement in his cognitive function yet. He is wondering if he will have to use this machine longer term and whether he has to keep using it.   His Past  Medical History Is Significant For: Past Medical History  Diagnosis Date  . Hypertension   . Pancytopenia     mild  . Charcot-Marie-Tooth disease     hx  . Restless leg syndrome   . Viral syndrome     Hx  . Pancytopenia 02/27/2011  . Viral syndrome 02/27/2011  . Thyroid disease     "normal now"    His Past Surgical History Is Significant For: Past Surgical History  Procedure Laterality Date  . Ganglion cyst excision    . Nasal sinus surgery    . Knee cartilage repair  01/31/11    His Family History Is Significant For: Family History  Problem Relation Age of Onset  . Family history unknown: Yes    His Social History Is Significant For: History   Social History  . Marital Status: Married    Spouse Name: Dakota Thompson     Number of Children: 2  . Years of Education: N/A   Occupational History  . Retired    Social History Main Topics  . Smoking status: Former Smoker    Types: Cigarettes    Quit date: 05/22/1981  . Smokeless tobacco: Never Used  . Alcohol Use: No  . Drug Use: No  . Sexual Activity: None   Other Topics Concern  . None   Social History Narrative   Patient lives at home with his wife.    Patient has 2 children.    Patient is retired.     His Allergies Are:  No Known Allergies:   His Current Medications Are:  Outpatient Encounter Prescriptions as of 12/30/2013  Medication Sig  . aspirin 81 MG tablet Take 81 mg by mouth daily.    . calcium carbonate (TUMS - DOSED IN MG ELEMENTAL CALCIUM) 500 MG chewable tablet Chew 1 tablet by mouth as needed.    . celecoxib (CELEBREX) 100 MG capsule Take 100 mg by mouth 2 (two) times daily.   Dakota Thompson Calcium (STOOL SOFTENER PO) Take by mouth 2 (two) times daily. Takes 2 in the morning.  . gabapentin (NEURONTIN) 300 MG capsule Take 600 mg by mouth at bedtime. Take 900 mg as needed  . L-Methylfolate-B6-B12 (METANX PO) Take by mouth.  . Multiple Vitamins-Minerals (CENTRUM SILVER PO) Take by mouth.    . olmesartan  (BENICAR) 40 MG tablet Take 40 mg by mouth daily.    . Omega-3 Fatty Acids (FISH OIL) 1000 MG CAPS Take 1,200 mg by mouth 3 (three) times daily. Take 2 capsules by mouth in the morning and 1 in the evening.  Marland Kitchen omeprazole (PRILOSEC) 20 MG capsule Take 20 mg by mouth 2 (two) times daily.    . pravastatin (PRAVACHOL) 20 MG tablet Take 20 mg by mouth daily.  :  Review of Systems:  Out of a complete 14 point review of systems, all are reviewed and negative with  the exception of these symptoms as listed below:  Review of Systems  Constitutional: Negative.   HENT: Positive for hearing loss.   Eyes: Positive for pain and visual disturbance (blurred vision).  Respiratory: Negative.   Gastrointestinal: Negative.   Endocrine: Negative.   Genitourinary: Negative.   Musculoskeletal: Negative.   Skin: Negative.   Allergic/Immunologic: Negative.   Neurological:       Memory loss  Hematological: Negative.   Psychiatric/Behavioral: Positive for sleep disturbance (restless leg, insomnia, apnea, frequent waking, eds, snoring, sleep talking).    Objective:  Neurologic Exam  Physical Exam Physical Examination:   Filed Vitals:   12/30/13 1026  BP: 118/70  Pulse: 64  Temp: 97.6 F (36.4 C)    General Examination: The patient is a very pleasant 68 y.o. male in no acute distress. He appears well-developed and well-nourished and well groomed.   HEENT: Normocephalic, atraumatic, pupils are equal, round and reactive to light and accommodation. Funduscopic exam is normal with sharp disc margins noted. Extraocular tracking is good without limitation to gaze excursion or nystagmus noted. Normal smooth pursuit is noted. Hearing is grossly intact. He is hard of hearing. He has bilateral hearing aids in place. Face is symmetric with normal facial animation and normal facial sensation. Speech is clear with no dysarthria noted. There is no hypophonia. There is no lip, neck/head, jaw or voice tremor. Neck is  supple with full range of passive and active motion. There are no carotid bruits on auscultation. Oropharynx exam reveals: mild mouth dryness, adequate dental hygiene and moderate airway crowding.   Chest: Clear to auscultation without wheezing, rhonchi or crackles noted.  Heart: S1+S2+0, regular and normal without murmurs, rubs or gallops noted.   Abdomen: Soft, non-tender and non-distended with normal bowel sounds appreciated on auscultation.  Extremities: There is no pitting edema in the distal lower extremities bilaterally. Pedal pulses are intact.  Skin: Warm and dry without trophic changes noted. There are no varicose veins.  Musculoskeletal: exam reveals no obvious joint deformities, tenderness or joint swelling or erythema. He has high arches bilaterally in his feet.   Neurologically:  Mental status: The patient is awake, alert and oriented in all 4 spheres. His immediate and remote memory, attention, language skills and fund of knowledge are appropriate. There is no evidence of aphasia, agnosia, apraxia or anomia. Speech is clear with normal prosody and enunciation. Thought process is linear. Mood is normal and affect is normal.  Cranial nerves II - XII are as described above under HEENT exam. In addition: shoulder shrug is normal with equal shoulder height noted. Motor exam: Normal bulk, strength and tone is noted. There is no drift, tremor or rebound. Romberg is negative. Reflexes are 2+ throughout with the exception of absent ankle jerks bilaterally. Babinski: Toes are flexor bilaterally. Fine motor skills and coordination: intact with normal finger taps, normal hand movements, normal rapid alternating patting, normal foot taps and normal foot agility.  Cerebellar testing: No dysmetria or intention tremor on finger to nose testing. Heel to shin is unremarkable bilaterally. There is no truncal or gait ataxia.  Sensory exam: intact to light touch, pinprick, vibration, temperature sense  in the upper and evidence of decrease in pinprick sensation and vibration sense in the distal lower extremities and bilateral feet primarily extremities.  Gait, station and balance: He stands easily. No veering to one side is noted. No leaning to one side is noted. Posture is age-appropriate and stance is narrow based. Gait shows normal stride length  and normal pace. No problems turning are noted. He turns en bloc.               Assessment and Plan:    In summary, Dakota Thompson is a very pleasant 68 y.o.-year old male with an underlying medical history of neuropathy, obesity and restless leg syndrome, who presents for followup consultation of his sleep apnea after his to sleep studies. While he does not have any severe sleep apnea, his overall mild sleep apnea manifested with significant desaturations and his average oxygen saturation was just 90% in the baseline sleep test. This improved when he was placed on BiPAP. He could not tolerate CPAP. He is still struggling to be fully compliant and convinced that he needs his BiPAP his wife indicates that he has been sleeping better and seems to 2 which less in his sleep. His history and physical exam are consistent with mild neuropathy. He also endorses restless leg syndrome and his sleep studies did show mild PLMS. He did not have a whole lot of arousals with his PLMD. Nevertheless, you can consider treatment of his PLMS and restless leg symptoms. He is currently on Neurontin which he feels has helped. I talked to him and his wife at length about his sleep test results. His oxygen saturation improved quite a bit with BiPAP treatment. He is congratulated on his superb BiPAP compliance we talked about the compliance data at length as well. I encouraged him to continue using his BiPAP regularly as he may find that in the long run his cognitive function does improve some. It takes sometimes 3 months to get the full effect from BiPAP treatment or CPAP treatment for  sleep apnea. He has an appointment with you I believe next month and he will continue to followup with you for his neuropathy, restless leg syndrome and memory issues. I will see him routinely for his sleep apnea in about 3-4 months.  I answered all their questions today and the patient and his wife were in agreement with the above outlined plan. Thank you very much for allowing me to participate in the care of this very nice gentleman.  Star Age, MD, PhD [facility])

## 2013-12-30 NOTE — Patient Instructions (Signed)
Please continue using your BiPAP regularly. While your insurance requires that you use BiPAP at least 4 hours each night on 70% of the nights, I recommend, that you not skip any nights and use it throughout the night if you can. Getting used to BiPAP and staying with the treatment long term does take time and patience and discipline. Untreated obstructive sleep apnea when it is moderate to severe can have an adverse impact on cardiovascular health and raise her risk for heart disease, arrhythmias, hypertension, congestive heart failure, stroke and diabetes. Untreated obstructive sleep apnea causes sleep disruption, nonrestorative sleep, and sleep deprivation. This can have an impact on your day to day functioning and cause daytime sleepiness and impairment of cognitive function, memory loss, mood disturbance, and problems focussing. Using BiPAP regularly can improve these symptoms.   

## 2014-02-10 ENCOUNTER — Ambulatory Visit: Payer: Medicare Other | Admitting: Neurology

## 2014-02-17 ENCOUNTER — Ambulatory Visit: Payer: Medicare Other | Admitting: Neurology

## 2014-02-24 ENCOUNTER — Ambulatory Visit (INDEPENDENT_AMBULATORY_CARE_PROVIDER_SITE_OTHER): Payer: Medicare Other | Admitting: Neurology

## 2014-02-24 ENCOUNTER — Encounter: Payer: Self-pay | Admitting: Neurology

## 2014-02-24 VITALS — BP 112/56 | HR 75 | Ht 70.0 in | Wt 248.0 lb

## 2014-02-24 DIAGNOSIS — R4189 Other symptoms and signs involving cognitive functions and awareness: Secondary | ICD-10-CM | POA: Diagnosis not present

## 2014-02-24 DIAGNOSIS — G3184 Mild cognitive impairment, so stated: Secondary | ICD-10-CM | POA: Diagnosis not present

## 2014-02-24 DIAGNOSIS — F09 Unspecified mental disorder due to known physiological condition: Secondary | ICD-10-CM

## 2014-02-24 DIAGNOSIS — G2581 Restless legs syndrome: Secondary | ICD-10-CM
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
## 2014-02-24 NOTE — Patient Instructions (Signed)
Overall you are doing fairly well but I do want to suggest a few things today:   Remember to drink plenty of fluid, eat healthy meals and do not skip any meals. Try to eat protein with a every meal and eat a healthy snack such as fruit or nuts in between meals. Try to keep a regular sleep-wake schedule and try to exercise daily, particularly in the form of walking, 20-30 minutes a day, if you can.   Please have some blood work completed today after you check out  I would like you to have a MRI of the brain, you will be called to complete this  Please call us with any interim questions, concerns, problems, updates or refill requests.   My clinical assistant and will answer any of your questions and relay your messages to me and also relay most of my messages to you.   Our phone number is [phone]. We also have an after hours call service for urgent matters and there is a physician on-call for urgent questions. For any emergencies you know to call 911 or go to the nearest emergency room

## 2014-02-28 ENCOUNTER — Telehealth: Payer: Self-pay | Admitting: *Deleted

## 2014-02-28 NOTE — Telephone Encounter (Signed)
Unable to reach patient or leave a message.

## 2014-02-28 NOTE — Telephone Encounter (Signed)
Spoke to patient and he is aware of normal results.

## 2014-02-28 NOTE — Telephone Encounter (Signed)
Message copied by Vivi Barrack on Tue Feb 28, 2014  4:13 PM ------      Message from: Drema Dallas      Created: Tue Feb 28, 2014  9:49 AM       Please let him know his labs were normal. Thanks. ------

## 2014-03-01 LAB — TSH: TSH: 1.59 u[IU]/mL (ref 0.450–4.500)

## 2014-03-01 LAB — METHYLMALONIC ACID, SERUM: METHYLMALONIC ACID: 178 nmol/L (ref 0–378)

## 2014-03-01 LAB — VITAMIN B12: Vitamin B-12: >1999 pg/mL — ABNORMAL HIGH (ref 211–946)

## 2014-03-07 ENCOUNTER — Encounter (HOSPITAL_COMMUNITY): Payer: Self-pay

## 2014-03-07 ENCOUNTER — Ambulatory Visit (HOSPITAL_COMMUNITY)
Admission: RE | Admit: 2014-03-07 | Discharge: 2014-03-07 | Disposition: A | Discharge status: Home / Self Care | Payer: Medicare Other | Source: Ambulatory Visit | Attending: Neurology | Admitting: Neurology

## 2014-03-07 DIAGNOSIS — R4182 Altered mental status, unspecified: Secondary | ICD-10-CM | POA: Diagnosis not present

## 2014-03-07 DIAGNOSIS — R4189 Other symptoms and signs involving cognitive functions and awareness: Secondary | ICD-10-CM

## 2014-03-07 DIAGNOSIS — G319 Degenerative disease of nervous system, unspecified: Secondary | ICD-10-CM | POA: Diagnosis not present

## 2014-03-07 DIAGNOSIS — R5381 Other malaise: Secondary | ICD-10-CM | POA: Diagnosis not present

## 2014-04-19 DIAGNOSIS — Z23 Encounter for immunization: Secondary | ICD-10-CM | POA: Diagnosis not present

## 2014-05-10 ENCOUNTER — Telehealth: Payer: Self-pay | Admitting: Neurology

## 2014-05-10 NOTE — Telephone Encounter (Signed)
Confirmed schedule change appt for same day (06/27/14)

## 2014-06-09 DIAGNOSIS — Z6835 Body mass index (BMI) 35.0-35.9, adult: Secondary | ICD-10-CM | POA: Diagnosis not present

## 2014-06-09 DIAGNOSIS — J209 Acute bronchitis, unspecified: Secondary | ICD-10-CM | POA: Diagnosis not present

## 2014-06-09 DIAGNOSIS — K219 Gastro-esophageal reflux disease without esophagitis: Secondary | ICD-10-CM | POA: Diagnosis not present

## 2014-06-09 DIAGNOSIS — I10 Essential (primary) hypertension: Secondary | ICD-10-CM | POA: Diagnosis not present

## 2014-06-09 DIAGNOSIS — J0100 Acute maxillary sinusitis, unspecified: Secondary | ICD-10-CM | POA: Diagnosis not present

## 2014-06-09 DIAGNOSIS — M1991 Primary osteoarthritis, unspecified site: Secondary | ICD-10-CM | POA: Diagnosis not present

## 2014-06-21 ENCOUNTER — Telehealth: Payer: Self-pay | Admitting: Neurology

## 2014-06-21 NOTE — Telephone Encounter (Signed)
Called [facility] and spoke with [place] for recent download, she said that she will give message to therapist to fax report to our office asap

## 2014-06-21 NOTE — Telephone Encounter (Signed)
[place] with [facility] is returning a call. The last download for patient is in August. Do you want this? Please call and advise.

## 2014-06-22 NOTE — Telephone Encounter (Signed)
Spoke with patient and he said that he will take card to [facility] and have them download and they will fax report to our office.

## 2014-06-22 NOTE — Telephone Encounter (Signed)
Spoke with Linus Orn and she will fax August download and will contact patient and ask him to bring machine in for a more recent download

## 2014-06-27 ENCOUNTER — Ambulatory Visit: Payer: Self-pay | Admitting: Neurology

## 2014-06-27 ENCOUNTER — Ambulatory Visit: Payer: Medicare Other | Admitting: Neurology

## 2014-07-21 ENCOUNTER — Encounter: Payer: Self-pay | Admitting: Neurology

## 2014-09-15 ENCOUNTER — Ambulatory Visit (INDEPENDENT_AMBULATORY_CARE_PROVIDER_SITE_OTHER): Payer: Medicare (Managed Care) | Admitting: Neurology

## 2014-09-15 ENCOUNTER — Encounter: Payer: Self-pay | Admitting: Neurology

## 2014-09-15 VITALS — BP 118/67 | HR 65 | Temp 97.2°F | Resp 18 | Ht 71.0 in | Wt 252.0 lb

## 2014-09-15 DIAGNOSIS — G4733 Obstructive sleep apnea (adult) (pediatric): Secondary | ICD-10-CM

## 2014-09-15 DIAGNOSIS — E669 Obesity, unspecified: Secondary | ICD-10-CM | POA: Diagnosis not present

## 2014-09-15 DIAGNOSIS — Z9841 Cataract extraction status, right eye: Secondary | ICD-10-CM

## 2014-09-15 DIAGNOSIS — M25562 Pain in left knee: Secondary | ICD-10-CM | POA: Diagnosis not present

## 2014-09-15 DIAGNOSIS — H9193 Unspecified hearing loss, bilateral: Secondary | ICD-10-CM | POA: Diagnosis not present

## 2014-09-15 DIAGNOSIS — Z9989 Dependence on other enabling machines and devices: Principal | ICD-10-CM

## 2014-09-15 DIAGNOSIS — G3184 Mild cognitive impairment, so stated: Secondary | ICD-10-CM | POA: Diagnosis not present

## 2014-09-15 DIAGNOSIS — G4761 Periodic limb movement disorder: Secondary | ICD-10-CM

## 2014-09-15 DIAGNOSIS — M25561 Pain in right knee: Secondary | ICD-10-CM

## 2014-09-15 DIAGNOSIS — Z9842 Cataract extraction status, left eye: Secondary | ICD-10-CM

## 2014-09-15 NOTE — Progress Notes (Signed)
Subjective:    Patient ID: Dakota Thompson is a 69 y.o. male.  HPI     Interim history:   Dakota Thompson is a 69 year old right-handed gentleman with an underlying medical history of neuropathy, obesity and restless leg syndrome and mild cognitive impairment, who presents for follow-up consultation of his obstructive sleep apnea. The patient is unaccompanied today. I first met him on 12/30/2013 at the request of Dr. Janann Colonel, at which time we talked about his recent sleep test results including his baseline sleep study from March 2015 as well as a CPAP titration study from April 2015 and we talked about his compliance data on how he felt. He reported sleeping better and he was twitching less during his sleep. In the interim, he was seen by Dr. 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 His MOCA score was 27 at the time.  Today, 09/15/2014: I reviewed his BiPAP compliance data from 03/24/14 to 06/21/14, which is a total of 90 days, during which time he used his machine every night, with percent used days greater than 4 hours of 91.1%, indicating excellent compliance, with an average usage of 6 hours and 28 minutes, pressure at 12/8, residual AHI at 1.5 per hour, leak low.  I reviewed his BiPAP compliance data from 08/16/2014 through 09/14/2014 which is a total of 30 days during which time he used his machine every night with percent used days greater than 4 hours at 96.7%, indicating excellent compliance. Pressure setting the same, residual AHI low at 1.7 per hour, leak low. Average usage of 6 hours and 37 minutes for all nights.  Today, 09/15/2014: He reports having had R cataract surgery on 08/03/14 and L cataract repair on 08/31/14 with good results. He is able to provide his own history but his wife adds more details. She is particularly concerned about the lack of his mobility. He does not exercise. He claims he has severe knee pain. He has in fact seen an orthopedic doctor a couple of years ago and was  told he was not ready for surgery but did have significant arthritic changes. He's not had any injection treatment. He's not had reevaluation and probably a year or 2. He does not drink enough water. He likes to drink one cup of coffee in the morning and [facility] for lunch and dinner typically. He is still struggling to be compliant with treatment. Overall he sleeps better with it. His wife is noted less twitching of his legs and less snoring and he seems to sleep more consolidated. He still feels some moisture is too high and the heat of the moisture is too high. He has not met with his DME company to address this problem. As far as his memory, his long-term memory is good, he has had no significant decline in his memory but is also not necessarily better. She feels that he may have issues with OCD. He seems to get something in his head and is not able to let it go. He would like to try different full face mask. He could not tolerate the chin strap.   Previously:  His baseline sleep study from 09/06/2013 showed a sleep efficiency was reduced at 66.6% with a latency to sleep of 13.5 minutes and wake after sleep onset of 131 minutes with moderate sleep fragmentation noted. He had an elevated arousal index. He had an increased percentage of stage II sleep, 5.6% of slow-wave sleep, and 19.1% of REM sleep with a significantly reduced REM  latency of 2 minutes. He had mild periodic leg movements at 17.9 per hour resulting in 4 arousals per hour. He had mild to moderate snoring. He did not achieve much in the way of supine sleep reporting that he's not able to sleep on his back. He had one central apnea and 25 obstructive hypopneas with a total AHI of 5.4 per hour, rising to 9.8 per hour in REM sleep. Baseline oxygen saturation was only 90%, nadir was 84%. Time below 88% saturation was 8 minutes and 18 seconds. He was then requested to come back for a CPAP titration study to help his sleep disordered breathing and  in light of his significant desaturations. He had a CPAP titration study on 10/06/2013 which showed a sleep efficiency of 65.4% with a prolonged sleep latency of 59.5 minutes and wake after sleep onset of 106.5 minutes with moderate sleep fragmentation noted. He had an elevated arousal index at 13.4 per hour primarily because of spontaneous arousals. He had an increased percentage of light stage sleep, 1.6% of slow-wave sleep, and a mildly decreased percentage of REM sleep at 17.5% with a prolonged REM latency. He had mild PLMS a 10.7 per hour with an associated arousal index of 1.7 per hour. Baseline oxygen saturation was 91%, nadir was 88%. He was started on CPAP at 5 cm. However he was not able to tolerate this and felt panicked. He did not tolerate the nasal pillows mask or nasal mask and did somewhat better with a full facemask. He was switched to BiPAP for better tolerance of the pressures were titrated from 8/4 cm to 12/8 cm. His oxygen saturation appeared to be better on the final setting of 12/8 cm. REM sleep was achieved on the final pressure but very little supine sleep was achieved during the study. His AHI was 0 on the final pressure. Based on the test results I prescribed BiPAP for him.    I reviewed the patient's PAP compliance data from 11/02/13 to 12/01/2013, which is a total of 30 days, during which time the patient used BiPAP.  The average usage for all days was 6 hours and 29 minutes. The percent used days greater than 4 hours was 100 %, indicating superb compliance. The residual AHI was 2.1 per hour, indicating an adequate treatment pressure of 12/8 cwp with low leak reported.  His typical bedtime is reported to be around 11 PM. He reports that he does not really like using his BiPAP but he is getting used to it. Provides report he seems to sleep more soundly and is less restless in his sleep. He has not noted any improvement in his cognitive function yet. He is wondering if he will have to  use this machine longer term and whether he has to keep using it.   His Past Medical History Is Significant For: Past Medical History  Diagnosis Date  . Hypertension   . Pancytopenia     mild  . Charcot-Marie-Tooth disease     hx  . Restless leg syndrome   . Viral syndrome     Hx  . Pancytopenia 02/27/2011  . Viral syndrome 02/27/2011  . Thyroid disease     "normal now"    His Past Surgical History Is Significant For: Past Surgical History  Procedure Laterality Date  . Ganglion cyst excision    . Nasal sinus surgery    . Knee cartilage repair  01/31/11    His Family History Is Significant For: No family history on  file.  His Social History Is Significant For: History   Social History  . Marital Status: Married    Spouse Name: Adela Lank   . Number of Children: 2  . Years of Education: N/A   Occupational History  . Retired    Social History Main Topics  . Smoking status: Former Smoker    Types: Cigarettes    Quit date: 05/22/1981  . Smokeless tobacco: Never Used  . Alcohol Use: No  . Drug Use: No  . Sexual Activity: Not on file   Other Topics Concern  . None   Social History Narrative   Patient lives at home with his wife.    Patient has 2 children.    Patient is retired.     His Allergies Are:  No Known Allergies:   His Current Medications Are:  Outpatient Encounter Prescriptions as of 09/15/2014  Medication Sig  . aspirin 81 MG tablet Take 81 mg by mouth daily.    . calcium carbonate (TUMS - DOSED IN MG ELEMENTAL CALCIUM) 500 MG chewable tablet Chew 1 tablet by mouth as needed.    . celecoxib (CELEBREX) 100 MG capsule Take 100 mg by mouth 2 (two) times daily.   Mariane Baumgarten Calcium (STOOL SOFTENER PO) Take by mouth 2 (two) times daily. Takes 2 in the morning.  . gabapentin (NEURONTIN) 300 MG capsule Take 600 mg by mouth at bedtime. Take 900 mg as needed  . Multiple Vitamins-Minerals (CENTRUM SILVER PO) Take by mouth.    . olmesartan (BENICAR) 40 MG tablet  Take 40 mg by mouth daily.    . Omega-3 Fatty Acids (FISH OIL) 1000 MG CAPS Take 1,200 mg by mouth 3 (three) times daily. Take 2 capsules by mouth in the morning and 1 in the evening.  Marland Kitchen omeprazole (PRILOSEC) 20 MG capsule Take 20 mg by mouth 2 (two) times daily.    . pravastatin (PRAVACHOL) 20 MG tablet Take 20 mg by mouth daily.  . [DISCONTINUED] L-Methylfolate-B6-B12 (METANX PO) Take by mouth.  :  Review of Systems:  Out of a complete 14 point review of systems, all are reviewed and negative with the exception of these symptoms as listed below:   Review of Systems  All other systems reviewed and are negative.   Objective:  Neurologic Exam  Physical Exam Physical Examination:   Filed Vitals:   09/15/14 1122  BP: 118/67  Pulse: 65  Temp: 97.2 F (36.2 C)  Resp: 18    General Examination: The patient is a very pleasant 69 y.o. male in no acute distress. He appears well-developed and well-nourished and well groomed.   HEENT: Normocephalic, atraumatic, pupils are equal, round and reactive to light and accommodation. Funduscopic exam is normal with sharp disc margins noted. Extraocular tracking is good without limitation to gaze excursion or nystagmus noted. Normal smooth pursuit is noted. He is hard of hearing. He has bilateral hearing aids in place. Tympanic membranes are clear bilaterally. Face is symmetric with normal facial animation and normal facial sensation. Speech is clear with no dysarthria noted. There is no hypophonia. There is no lip, neck/head, jaw or voice tremor. Neck is supple with full range of passive and active motion. There are no carotid bruits on auscultation. Oropharynx exam reveals: mild mouth dryness, adequate dental hygiene and moderate airway crowding.   Chest: Clear to auscultation without wheezing, rhonchi or crackles noted.  Heart: S1+S2+0, regular and normal without murmurs, rubs or gallops noted.   Abdomen: Soft, non-tender and non-distended  with  normal bowel sounds appreciated on auscultation.  Extremities: There is no pitting edema in the distal lower extremities bilaterally. Pedal pulses are intact.  Skin: Warm and dry without trophic changes noted. There are no varicose veins.  Musculoskeletal: exam reveals no obvious joint deformities, tenderness or joint swelling or erythema. He has high arches bilaterally in his feet. He reports bilateral knee pain.  Neurologically:  Mental status: The patient is awake, alert and oriented in all 4 spheres. His immediate and remote memory, attention, language skills and fund of knowledge are fairly appropriate. There is no evidence of aphasia, agnosia, apraxia or anomia. Speech is clear with normal prosody and enunciation. Thought process is linear. Mood is normal and affect is normal.  Cranial nerves II - XII are as described above under HEENT exam. In addition: shoulder shrug is normal with equal shoulder height noted. Motor exam: Normal bulk, strength and tone is noted. There is no drift, tremor or rebound. Romberg is negative. Reflexes are 2+ throughout with the exception of absent ankle jerks bilaterally. Babinski: Toes are flexor bilaterally. Fine motor skills and coordination: intact with normal finger taps, normal hand movements, normal rapid alternating patting, normal foot taps and normal foot agility.  Cerebellar testing: No dysmetria or intention tremor on finger to nose testing. Heel to shin is unremarkable bilaterally. There is no truncal or gait ataxia.  Sensory exam: intact to light touch, pinprick, vibration, temperature sense in the upper and evidence of decrease in pinprick sensation and vibration sense in the distal lower extremities and bilateral feet primarily extremities.  Gait, station and balance: He stands easily. No veering to one side is noted. No leaning to one side is noted. Posture is age-appropriate and stance is narrow based. Gait shows normal stride length and normal  pace. No problems turning are noted. He turns en bloc.               Assessment and Plan:   In summary, Dakota Thompson is a very pleasant 69 year old male with an underlying medical history of neuropathy, obesity and restless leg syndrome, who presents for followup consultation of his obstructive sleep apnea. He had overall mild obstructive sleep apnea but significant desaturations and low average oxygen saturation was which all improved with treatment with standard BiPAP therapy. He could not tolerate CPAP. He is compliant with treatment and is congratulated on his compliance. Sleep consolidation has improved, snoring is eliminated and his restless leg symptoms are improved and leg twitching is better. Memory-wise, he has most likely mild cognitive impairment which we will continue to monitor. We will recheck memory scores next time. He is encouraged to try to exercise more within his limitations. He is advised to seek consultation with orthopedics again regarding his knee pain and treatment options. He is advised to drink more water and stay better hydrated and reduce his caffeine intake, particularly avoid drinking sodas with supper or at night. He is currently on gabapentin, 200-300 mg each night which he feels has helped his sleep as well.  I would like to see him back in 6 months, sooner if the need arises. I answered all their questions today and the patient and his wife were in agreement with the above outlined plan.  I spent 25 minutes in total face-to-face time with the patient, more than 50% of which was spent in counseling and coordination of care, reviewing test results, reviewing medication and discussing or reviewing the diagnosis of OSA and memory loss, the  prognosis and treatment options.

## 2014-09-15 NOTE — Patient Instructions (Addendum)
Please continue using your biPAP regularly. While your insurance requires that you use biPAP at least 4 hours each night on 70% of the nights, I recommend, that you not skip any nights and use it throughout the night if you can. Getting used to biPAP and staying with the treatment long term does take time and patience and discipline. Untreated obstructive sleep apnea when it is moderate to severe can have an adverse impact on cardiovascular health and raise her risk for heart disease, arrhythmias, hypertension, congestive heart failure, stroke and diabetes. Untreated obstructive sleep apnea causes sleep disruption, nonrestorative sleep, and sleep deprivation. This can have an impact on your day to day functioning and cause daytime sleepiness and impairment of cognitive function, memory loss, mood disturbance, and problems focussing. Using biPAP regularly can improve these symptoms.  Please drink more water, avoid caffeine/soda with supper.   Please try to stay more physically active.

## 2014-11-27 ENCOUNTER — Telehealth: Payer: Self-pay | Admitting: Neurology

## 2014-11-27 DIAGNOSIS — G4733 Obstructive sleep apnea (adult) (pediatric): Secondary | ICD-10-CM

## 2014-11-27 DIAGNOSIS — Z9989 Dependence on other enabling machines and devices: Principal | ICD-10-CM

## 2014-11-27 NOTE — Telephone Encounter (Signed)
Last seen in March

## 2014-11-27 NOTE — Telephone Encounter (Signed)
CPAP supply order placed. Please fax to DME. Pt seen on 09/15/14.

## 2014-11-27 NOTE — Telephone Encounter (Signed)
Chasity from [facility] home [facility] called office stating that pt is currently in need of cpap supplies. She requests that Rx for filter, tubing, water chamber,mask, and head gear be sent to  [phone] via fax. If you have any questions or concerns about the request please contact Arnold at [phone].

## 2014-11-28 NOTE — Telephone Encounter (Signed)
Orders faxed this morning.

## 2014-12-05 ENCOUNTER — Encounter: Payer: Self-pay | Admitting: Internal Medicine

## 2015-01-11 ENCOUNTER — Ambulatory Visit (INDEPENDENT_AMBULATORY_CARE_PROVIDER_SITE_OTHER): Payer: Medicare Other | Admitting: Gastroenterology

## 2015-01-11 ENCOUNTER — Encounter: Payer: Self-pay | Admitting: Gastroenterology

## 2015-01-11 ENCOUNTER — Other Ambulatory Visit: Payer: Self-pay

## 2015-01-11 VITALS — BP 127/71 | HR 63 | Temp 97.6°F | Ht 69.0 in | Wt 247.8 lb

## 2015-01-11 DIAGNOSIS — K219 Gastro-esophageal reflux disease without esophagitis: Secondary | ICD-10-CM | POA: Diagnosis not present

## 2015-01-11 DIAGNOSIS — Z86010 Personal history of colonic polyps: Secondary | ICD-10-CM

## 2015-01-11 DIAGNOSIS — K5900 Constipation, unspecified: Secondary | ICD-10-CM | POA: Diagnosis not present

## 2015-01-11 MED ORDER — PEG-KCL-NACL-NASULF-NA ASC-C 100 G PO SOLR: 1.0000 | ORAL | Status: DC

## 2015-01-11 NOTE — Assessment & Plan Note (Addendum)
History of tubulovillous adenoma. Due for surveillance colonoscopy. He had a marginal prep last time with MiraLAX prep. Will try split dose prepping this time. Patient advised to take MiraLAX twice daily for 3 days prior to procedure or bisacodyl 10 mg daily for 3 days prior to procedure to augment bowel preparation.  I have discussed the risks, alternatives, benefits with regards to but not limited to the risk of reaction to medication, bleeding, infection, perforation and the patient is agreeable to proceed. Written consent to be obtained.  Patient reports growth around the anal opening and would like to have evaluated at time of his procedure.

## 2015-01-11 NOTE — Assessment & Plan Note (Signed)
Chronic GERD well-controlled on omeprazole 20 mg twice a day. He has had chronic GERD for 7-10 years. No prior upper endoscopy. Discussed upper endoscopy for Barrett's screening and he would like to proceed.  I have discussed the risks, alternatives, benefits with regards to but not limited to the risk of reaction to medication, bleeding, infection, perforation and the patient is agreeable to proceed. Written consent to be obtained.

## 2015-01-11 NOTE — Assessment & Plan Note (Signed)
Mild constipation currently on Colace. Not as effective as he would like. Trial of MiraLAX 17 g daily as needed.

## 2015-01-11 NOTE — Progress Notes (Signed)
cc'ed to pcp °

## 2015-01-11 NOTE — Progress Notes (Signed)
Primary Care Physician:  Glo Herring., MD  Primary Gastroenterologist:  Garfield Cornea, MD   Chief Complaint  Patient presents with  . Colonoscopy    HPI:  Dakota Thompson is a 69 y.o. male here to schedule surveillance colonoscopy. Last colonoscopy July 2011. Marginal prep compromised exam. Patient states he had MiraLAX prep that time. Normal colon. He has a history of tubulovillous adenoma removed from his colon previously therefore needs a 5 year follow-up surveillance exam.  Something outside anal opening. Not bothering him but wonders what it is. BM most days with some straining, smaller stools. Takes two stools softeners daily. Feels like needs something more. No blood in stool or melena. Heartburn controlled most of the time depending on what he eats. Sometimes breakthrough symptoms in the evening if he eats something that causes typically causes heartburn for him. Denies dysphagia, vomiting, abdominal pain, melena, rectal bleeding. Chronic GERD for 7-8 years. No prior egd.    Current Outpatient Prescriptions  Medication Sig Dispense Refill  . aspirin 81 MG tablet Take 81 mg by mouth daily.      . calcium carbonate (TUMS - DOSED IN MG ELEMENTAL CALCIUM) 500 MG chewable tablet Chew 1 tablet by mouth as needed.      . celecoxib (CELEBREX) 100 MG capsule Take 100 mg by mouth 2 (two) times daily.     Mariane Baumgarten Calcium (STOOL SOFTENER PO) Take by mouth 2 (two) times daily. Takes 2 in the morning.    . gabapentin (NEURONTIN) 300 MG capsule Take 600 mg by mouth at bedtime. Take 900 mg as needed    . Multiple Vitamins-Minerals (CENTRUM SILVER PO) Take by mouth.      . olmesartan (BENICAR) 40 MG tablet Take 40 mg by mouth daily.      . Omega-3 Fatty Acids (FISH OIL) 1000 MG CAPS Take 1,200 mg by mouth 3 (three) times daily. Take 2 capsules by mouth in the morning and 1 in the evening.    Marland Kitchen omeprazole (PRILOSEC) 20 MG capsule Take 20 mg by mouth 2 (two) times daily.      . pravastatin  (PRAVACHOL) 20 MG tablet Take 20 mg by mouth daily.     No current facility-administered medications for this visit.    Allergies as of 01/11/2015  . (No Known Allergies)    Past Medical History  Diagnosis Date  . Hypertension   . Pancytopenia     mild  . 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 Restless leg syndrome   . Viral syndrome     Hx  . Pancytopenia 02/27/2011  . Viral syndrome 02/27/2011  . Thyroid disease     "normal now"  . Hx of adenomatous colonic polyps     tubulovillous adenoma    Past Surgical History  Procedure Laterality Date  . Ganglion cyst excision    . Nasal sinus surgery    . Knee cartilage repair  01/31/11  . Colonoscopy  01/02/2010    RMR: normal rectum and colon. marginal prep compromised exam.     Family History  Problem Relation Age of Onset  . Colon cancer Neg Hx   . Cancer Brother     unknown type  . Cancer Sister     unknown type    History   Social History  . Marital Status: Married    Spouse Name: Adela Lank   . Number of Children: 2  . Years of Education: N/A  Occupational History  . Retired    Social History Main Topics  . Smoking status: Former Smoker    Types: Cigarettes    Quit date: 05/22/1981  . Smokeless tobacco: Never Used  . Alcohol Use: No  . Drug Use: No  . Sexual Activity: Not on file   Other Topics Concern  . Not on file   Social History Narrative   Patient lives at home with his wife.    Patient has 2 children.    Patient is retired.       ROS:  General: Negative for anorexia, weight loss, fever, chills, fatigue, weakness. Eyes: Negative for vision changes.  ENT: Negative for hoarseness, difficulty swallowing , nasal congestion. CV: Negative for chest pain, angina, palpitations, dyspnea on exertion, peripheral edema.  Respiratory: Negative for dyspnea at rest, dyspnea on exertion, cough, sputum, wheezing.  GI: See history of present illness. GU:  Negative  for dysuria, hematuria, urinary incontinence, urinary frequency, nocturnal urination.  MS: Negative for joint pain, low back pain.  Derm: Negative for rash or itching.  Neuro: Negative for weakness, abnormal sensation, seizure, frequent headaches, memory loss, confusion.  Psych: Negative for anxiety, depression, suicidal ideation, hallucinations.  Endo: Negative for unusual weight change.  Heme: Negative for bruising or bleeding. Allergy: Negative for rash or hives.    Physical Examination:  BP 127/71 mmHg  Pulse 63  Temp(Src) 97.6 F (36.4 C) (Oral)  Ht 5\' 9"  (1.753 m)  Wt 247 lb 12.8 oz (112.401 kg)  BMI 36.58 kg/m2   General: Well-nourished, well-developed in no acute distress.  Head: Normocephalic, atraumatic.   Eyes: Conjunctiva pink, no icterus. Mouth: Oropharyngeal mucosa moist and pink , no lesions erythema or exudate. Neck: Supple without thyromegaly, masses, or lymphadenopathy.  Lungs: Clear to auscultation bilaterally.  Heart: Regular rate and rhythm, no murmurs rubs or gallops.  Abdomen: Bowel sounds are normal, nontender, nondistended, no hepatosplenomegaly or masses, no abdominal bruits or    hernia , no rebound or guarding.   Rectal: Deferred to time of colonoscopy Extremities: No lower extremity edema. No clubbing or deformities.  Neuro: Alert and oriented x 4 , grossly normal neurologically.  Skin: Warm and dry, no rash or jaundice.   Psych: Alert and cooperative, normal mood and affect.  Imaging Studies: No results found.

## 2015-01-11 NOTE — Patient Instructions (Signed)
1. For every day management of constipation: you can take Miralax one capful daily as needed.  2. Prior to your bowel prep: 3-4 days prior to your bowel prep, you should make sure your bowels are moving well. You can take Women's Correctol two daily for 3 days or make sure you are taking miralax twice daily for 3 days before.  3. Colonoscopy and upper endoscopy as scheduled. See separate instructions.

## 2015-02-08 ENCOUNTER — Telehealth: Payer: Self-pay | Admitting: Internal Medicine

## 2015-02-08 ENCOUNTER — Encounter (HOSPITAL_COMMUNITY)
Admission: RE | Disposition: A | Discharge status: Home / Self Care | Payer: Self-pay | Source: Ambulatory Visit | Attending: Internal Medicine

## 2015-02-08 ENCOUNTER — Encounter (HOSPITAL_COMMUNITY): Payer: Self-pay | Admitting: *Deleted

## 2015-02-08 ENCOUNTER — Ambulatory Visit (HOSPITAL_COMMUNITY)
Admission: RE | Admit: 2015-02-08 | Discharge: 2015-02-08 | Disposition: A | Discharge status: Home / Self Care | Payer: Medicare Other | Source: Ambulatory Visit | Attending: Internal Medicine | Admitting: Internal Medicine

## 2015-02-08 DIAGNOSIS — K219 Gastro-esophageal reflux disease without esophagitis: Secondary | ICD-10-CM

## 2015-02-08 DIAGNOSIS — Z86010 Personal history of colonic polyps: Secondary | ICD-10-CM

## 2015-02-08 HISTORY — DX: Sleep apnea, unspecified: G47.30

## 2015-02-08 SURGERY — COLONOSCOPY
Anesthesia: Moderate Sedation

## 2015-02-08 MED ORDER — SODIUM CHLORIDE 0.9 % IV SOLN: INTRAVENOUS | Status: DC

## 2015-02-08 NOTE — Telephone Encounter (Signed)
Nurse from Short Stay called to let us know that patient had eaten a ham and cheese sandwich and was suppose to have a EGD/TCS by RMR today. RMR offered to go ahead and do EGD, but patient refused both procedures and will need to be rescheduled.

## 2015-02-15 NOTE — Telephone Encounter (Signed)
Pt is set for appt with [facility] on 02/20/2015 @ 9:30 am. [facility]

## 2015-02-20 ENCOUNTER — Other Ambulatory Visit: Payer: Self-pay

## 2015-02-20 ENCOUNTER — Ambulatory Visit (INDEPENDENT_AMBULATORY_CARE_PROVIDER_SITE_OTHER): Payer: Medicare Other | Admitting: Gastroenterology

## 2015-02-20 ENCOUNTER — Encounter: Payer: Self-pay | Admitting: Gastroenterology

## 2015-02-20 VITALS — BP 142/75 | HR 71 | Temp 97.6°F | Ht 69.0 in | Wt 249.6 lb

## 2015-02-20 DIAGNOSIS — Z86010 Personal history of colonic polyps: Secondary | ICD-10-CM | POA: Diagnosis not present

## 2015-02-20 DIAGNOSIS — K5900 Constipation, unspecified: Secondary | ICD-10-CM

## 2015-02-20 DIAGNOSIS — K219 Gastro-esophageal reflux disease without esophagitis: Secondary | ICD-10-CM | POA: Diagnosis not present

## 2015-02-20 MED ORDER — PEG-KCL-NACL-NASULF-NA ASC-C 100 G PO SOLR: 1.0000 | ORAL | Status: DC

## 2015-02-20 NOTE — Assessment & Plan Note (Signed)
Chronic GERD well-controlled on omeprazole 20 mg twice a day for the most part. History of GERD for 7 or 8 years. No prior upper endoscopy.  I have discussed the risks, alternatives, benefits with regards to but not limited to the risk of reaction to medication, bleeding, infection, perforation and the patient is agreeable to proceed. Written consent to be obtained.

## 2015-02-20 NOTE — Assessment & Plan Note (Signed)
Continue MiraLAX 17 g daily as needed. Offered change in therapy given his complaints of difficulty to "clean up after bowel movement" but patient is satisfied with current regimen.

## 2015-02-20 NOTE — Progress Notes (Signed)
CC'ED TO PCP 

## 2015-02-20 NOTE — Assessment & Plan Note (Signed)
History tubulovillous adenoma. Due for surveillance colonoscopy. Previous colonoscopy in 2011, marginal prep with MiraLAX prep. Recent colonoscopy had to be rescheduled because the patient ate solid food during his prep. Discussed bowel preparation at length with patient today. Encouraged him to read through the instructions several days worse procedure to make sure he can follow instructions and answer any questions prior to his bowel prep. Patient voiced understanding. We'll plan on leaving prep, split dose. Starting 3 days prior to prep, he will take MiraLAX twice a day and bisacodyl 10 mg daily.  I have discussed the risks, alternatives, benefits with regards to but not limited to the risk of reaction to medication, bleeding, infection, perforation and the patient is agreeable to proceed. Written consent to be obtained.  Patient reports growth around the anal opening and would like to have this evaluated at time of his procedure.

## 2015-02-20 NOTE — Patient Instructions (Signed)
1. Colonoscopy and upper endoscopy with Dr. Gala Romney as scheduled. Please read through your prep instructions thoroughly several days before your procedure to make sure you know when to start clear liquid diet. If you have any questions please don't hesitate to call. 2. To make sure your prep was adequate, please start MiraLAX 1 capful twice daily 3 days before your bowel prep starts. Also take Dulcolax tablets 10 mg (2 tablets) daily for 3 days prior to bowel prep.

## 2015-02-20 NOTE — Progress Notes (Signed)
Primary Care Physician:  Glo Herring., MD  Primary Gastroenterologist:  Garfield Cornea, MD   Chief Complaint  Patient presents with  . set up procedures    HPI:  Dakota Thompson is a 69 y.o. male here to reschedule his EGD and surveillance colonoscopy. He was noncompliant with dietary changes preceding his colonoscopy therefore his procedure had to be canceled last month. States he misread the instructions.   Last colonoscopy was July 2011. Marginal prep compromised exam. Patient reports having the MiraLAX prep at that time. Colon was normal. 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  He has something outside his anal opening which he wonders what it is. Doesn't seem to be bothering him but he would like it to be looked at it time of his colonoscopy.  Typically has bowel movement most days with some straining, smaller stools. Uses 2 stool softeners daily. No blood in the stool or melena. Feels like incomplete evacuation. Using MiraLAX 17 g daily as needed since his last office visit. Heartburn controlled most of the time depending on what he eats. If he eats something that typically causes heartburn he will have breakthrough symptoms in the evening. Denies dysphagia, vomiting, abdominal pain, melena, rectal bleeding. He's never had an EGD. Chronic GERD for over 8 years.  Current Outpatient Prescriptions  Medication Sig Dispense Refill  . aspirin 81 MG tablet Take 81 mg by mouth daily.      . celecoxib (CELEBREX) 100 MG capsule Take 100 mg by mouth 2 (two) times daily.     Mariane Baumgarten Calcium (STOOL SOFTENER PO) Take by mouth 2 (two) times daily. Takes 2 in the morning.    . gabapentin (NEURONTIN) 300 MG capsule Take 600 mg by mouth at bedtime. Take 900 mg as needed    . Multiple Vitamins-Minerals (CENTRUM SILVER PO) Take by mouth.      . olmesartan (BENICAR) 40 MG tablet Take 40 mg by mouth daily.       . Omega-3 Fatty Acids (FISH OIL) 1000 MG CAPS Take 1,200 mg by mouth 3 (three) times daily. Take 2 capsules by mouth in the morning and 1 in the evening.    Marland Kitchen omeprazole (PRILOSEC) 20 MG capsule Take 20 mg by mouth 2 (two) times daily.      . pravastatin (PRAVACHOL) 20 MG tablet Take 20 mg by mouth daily.     No current facility-administered medications for this visit.    Allergies as of 02/20/2015  . (No Known Allergies)    Past Medical History  Diagnosis Date  . Hypertension   . Pancytopenia     mild  . 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 Restless leg syndrome   . Viral syndrome     Hx  . Pancytopenia 02/27/2011  . Viral syndrome 02/27/2011  . Thyroid disease     "normal now"  . Hx of adenomatous colonic polyps     tubulovillous adenoma  . Sleep apnea     wears CPAP/BIPAP every night    Past Surgical History  Procedure Laterality Date  . Ganglion cyst excision    . Nasal sinus surgery    . Knee cartilage repair  01/31/11  . Colonoscopy  01/02/2010    RMR: normal rectum and colon. marginal prep compromised exam.     Family History  Problem Relation Age of Onset  . Colon cancer  Neg Hx   . Cancer Brother     unknown type  . Cancer Sister     unknown type    Social History   Social History  . Marital Status: Married    Spouse Name: Adela Lank   . Number of Children: 2  . Years of Education: N/A   Occupational History  . Retired    Social History Main Topics  . Smoking status: Former Smoker    Types: Cigarettes    Quit date: 05/22/1981  . Smokeless tobacco: Never Used  . Alcohol Use: No  . Drug Use: No  . Sexual Activity: Not on file   Other Topics Concern  . Not on file   Social History Narrative   Patient lives at home with his wife.    Patient has 2 children.    Patient is retired.       ROS:  General: Negative for anorexia, weight loss, fever, chills, fatigue, weakness. Eyes: Negative for vision  changes.  ENT: Negative for hoarseness, difficulty swallowing , nasal congestion. CV: Negative for chest pain, angina, palpitations, dyspnea on exertion, peripheral edema.  Respiratory: Negative for dyspnea at rest, dyspnea on exertion, cough, sputum, wheezing.  GI: See history of present illness. GU:  Negative for dysuria, hematuria, urinary incontinence, urinary frequency, nocturnal urination.  MS: Negative for joint pain, low back pain.  Derm: Negative for rash or itching.  Neuro: Negative for weakness, abnormal sensation, seizure, frequent headaches, memory loss, confusion.  Psych: Negative for anxiety, depression, suicidal ideation, hallucinations.  Endo: Negative for unusual weight change.  Heme: Negative for bruising or bleeding. Allergy: Negative for rash or hives.    Physical Examination:  BP 142/75 mmHg  Pulse 71  Temp(Src) 97.6 F (36.4 C)  Ht 5\' 9"  (1.753 m)  Wt 249 lb 9.6 oz (113.218 kg)  BMI 36.84 kg/m2   General: Well-nourished, well-developed in no acute distress.  Head: Normocephalic, atraumatic.   Eyes: Conjunctiva pink, no icterus. Mouth: Oropharyngeal mucosa moist and pink , no lesions erythema or exudate. Neck: Supple without thyromegaly, masses, or lymphadenopathy.  Lungs: Clear to auscultation bilaterally.  Heart: Regular rate and rhythm, no murmurs rubs or gallops.  Abdomen: Bowel sounds are normal, nontender, nondistended, no hepatosplenomegaly or masses, no abdominal bruits or    hernia , no rebound or guarding.   Rectal: not performed Extremities: No lower extremity edema. No clubbing or deformities.  Neuro: Alert and oriented x 4 , grossly normal neurologically.  Skin: Warm and dry, no rash or jaundice.   Psych: Alert and cooperative, normal mood and affect.   Imaging Studies: No results found.

## 2015-03-07 ENCOUNTER — Telehealth: Payer: Self-pay

## 2015-03-07 NOTE — Telephone Encounter (Signed)
I spoke to patient and asked him to bring in memory card from [facility] to office visit tomorrow.

## 2015-03-08 ENCOUNTER — Encounter: Payer: Self-pay | Admitting: Neurology

## 2015-03-08 ENCOUNTER — Ambulatory Visit: Payer: Medicare (Managed Care) | Admitting: Neurology

## 2015-03-08 ENCOUNTER — Ambulatory Visit (INDEPENDENT_AMBULATORY_CARE_PROVIDER_SITE_OTHER): Payer: Medicare (Managed Care) | Admitting: Neurology

## 2015-03-08 VITALS — BP 128/58 | HR 70 | Resp 18 | Ht 69.0 in | Wt 250.0 lb

## 2015-03-08 DIAGNOSIS — G3184 Mild cognitive impairment, so stated: Secondary | ICD-10-CM | POA: Diagnosis not present

## 2015-03-08 DIAGNOSIS — Z9989 Dependence on other enabling machines and devices: Secondary | ICD-10-CM

## 2015-03-08 DIAGNOSIS — G4733 Obstructive sleep apnea (adult) (pediatric): Secondary | ICD-10-CM

## 2015-03-08 DIAGNOSIS — H9193 Unspecified hearing loss, bilateral: Secondary | ICD-10-CM | POA: Diagnosis not present

## 2015-03-08 DIAGNOSIS — E669 Obesity, unspecified: Secondary | ICD-10-CM

## 2015-03-08 NOTE — Progress Notes (Signed)
Subjective:    Patient ID: Dakota Thompson is a 69 y.o. male.  HPI     Interim history:   Dakota Thompson is a 69 year old right-handed gentleman with an underlying medical history of neuropathy, obesity and restless leg syndrome and mild cognitive impairment, who presents for follow-up consultation of his obstructive sleep apnea. The patient is accompanied by his wife today. I last saw him on 09/15/2014, at which time he reported R cataract surgery on 08/03/14 and L cataract repair on 08/31/14 with good results. His wife was worried about his lack of mobility. He was not exercising. He was reporting knee pain. He was not drinking enough water. He was struggling with BiPAP treatment. His wife had noted less twitching of his legs and less snoring and he seemed to sleep more consolidated. As far as his memory, his long-term memory much improved either. He felt he had issues with OCD. He could not tolerate the chin strap.    Today, 03/08/2015: I reviewed his BiPAP compliance data from 02/06/2015 through 03/07/2015 which is a total of 30 days during which time he used his machine every night with percent used days greater than 4 hours at 100%, indicating superb compliance with an average usage of 7 hours and 36 minutes, pressure of 12/8, residual AHI 1.3, leaked low.   Today, 03/08/2015: He feels that he is able to tolerate BiPAP a little bit better. He does not like his mask very much. Memory-wise, he may have become a little bit worse. He has to leave himself little notes on the kitchen counter to try to remember things. He is a Museum/gallery conservator. He tries to drink enough water. He does not exercise. He has knee pain. He thought about seeking additional medical advice for his knee pain but has not pursued this. Become a little worse. Dakota Thompson does worry about his lack of physical activity. I reviewed his brain MRI from 03/08/2015 through the PACS system today.  Previously:   He had a brain MRI wo contrast on  03/08/15: Mild cerebral atrophy, otherwise unremarkable appearance of the brain.    I first met him on 12/30/2013 at the request of Dr. Janann Colonel, at which time we talked about his recent sleep test results including his baseline sleep study from March 2015 as well as a CPAP titration study from April 2015 and we talked about his compliance data on how he felt. He reported sleeping better and he was twitching less during his sleep. In the interim, he was seen by Dr. 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 His MOCA score was 27 at the time.  I reviewed his BiPAP compliance data from 03/24/14 to 06/21/14, which is a total of 90 days, during which time he used his machine every night, with percent used days greater than 4 hours of 91.1%, indicating excellent compliance, with an average usage of 6 hours and 28 minutes, pressure at 12/8, residual AHI at 1.5 per hour, leak low.  I reviewed his BiPAP compliance data from 08/16/2014 through 09/14/2014 which is a total of 30 days during which time he used his machine every night with percent used days greater than 4 hours at 96.7%, indicating excellent compliance. Pressure setting the same, residual AHI low at 1.7 per hour, leak low. Average usage of 6 hours and 37 minutes for all nights.   His baseline sleep study from 09/06/2013 showed a sleep efficiency was reduced at 66.6% with a latency to sleep of 13.5 minutes and  wake after sleep onset of 131 minutes with moderate sleep fragmentation noted. He had an elevated arousal index. He had an increased percentage of stage II sleep, 5.6% of slow-wave sleep, and 19.1% of REM sleep with a significantly reduced REM latency of 2 minutes. He had mild periodic leg movements at 17.9 per hour resulting in 4 arousals per hour. He had mild to moderate snoring. He did not achieve much in the way of supine sleep reporting that he's not able to sleep on his back. He had one central apnea and 25 obstructive hypopneas with a total  AHI of 5.4 per hour, rising to 9.8 per hour in REM sleep. Baseline oxygen saturation was only 90%, nadir was 84%. Time below 88% saturation was 8 minutes and 18 seconds. He was then requested to come back for a CPAP titration study to help his sleep disordered breathing and in light of his significant desaturations. He had a CPAP titration study on 10/06/2013 which showed a sleep efficiency of 65.4% with a prolonged sleep latency of 59.5 minutes and wake after sleep onset of 106.5 minutes with moderate sleep fragmentation noted. He had an elevated arousal index at 13.4 per hour primarily because of spontaneous arousals. He had an increased percentage of light stage sleep, 1.6% of slow-wave sleep, and a mildly decreased percentage of REM sleep at 17.5% with a prolonged REM latency. He had mild PLMS a 10.7 per hour with an associated arousal index of 1.7 per hour. Baseline oxygen saturation was 91%, nadir was 88%. He was started on CPAP at 5 cm. However he was not able to tolerate this and felt panicked. He did not tolerate the nasal pillows mask or nasal mask and did somewhat better with a full facemask. He was switched to BiPAP for better tolerance of the pressures were titrated from 8/4 cm to 12/8 cm. His oxygen saturation appeared to be better on the final setting of 12/8 cm. REM sleep was achieved on the final pressure but very little supine sleep was achieved during the study. His AHI was 0 on the final pressure. Based on the test results I prescribed BiPAP for him.    I reviewed the patient's PAP compliance data from 11/02/13 to 12/01/2013, which is a total of 30 days, during which time the patient used BiPAP.  The average usage for all days was 6 hours and 29 minutes. The percent used days greater than 4 hours was 100 %, indicating superb compliance. The residual AHI was 2.1 per hour, indicating an adequate treatment pressure of 12/8 cwp with low leak reported.  His typical bedtime is reported to be around  11 PM. He reports that he does not really like using his BiPAP but he is getting used to it. Provides report he seems to sleep more soundly and is less restless in his sleep. He has not noted any improvement in his cognitive function yet. He is wondering if he will have to use this machine longer term and whether he has to keep using it.     His Past Medical History Is Significant For: Past Medical History  Diagnosis Date  . Hypertension   . Pancytopenia     mild  . 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 Restless leg syndrome   . Viral syndrome     Hx  . Pancytopenia 02/27/2011  . Viral syndrome 02/27/2011  . Thyroid disease     "normal now"  .  Hx of adenomatous colonic polyps     tubulovillous adenoma  . Sleep apnea     wears CPAP/BIPAP every night    His Past Surgical History Is Significant For: Past Surgical History  Procedure Laterality Date  . Ganglion cyst excision    . Nasal sinus surgery    . Knee cartilage repair  01/31/11  . Colonoscopy  01/02/2010    RMR: normal rectum and colon. marginal prep compromised exam.     His Family History Is Significant For: Family History  Problem Relation Age of Onset  . Colon cancer Neg Hx   . Cancer Brother     unknown type  . Cancer Sister     unknown type    His Social History Is Significant For: Social History   Social History  . Marital Status: Married    Spouse Name: Dakota Thompson   . Number of Children: 2  . Years of Education: N/A   Occupational History  . Retired    Social History Main Topics  . Smoking status: Former Smoker    Types: Cigarettes    Quit date: 05/22/1981  . Smokeless tobacco: Never Used  . Alcohol Use: No  . Drug Use: No  . Sexual Activity: Not Asked   Other Topics Concern  . None   Social History Narrative   Patient lives at home with his wife.    Patient has 2 children.    Patient is retired.     His Allergies Are:  No Known Allergies:   His  Current Medications Are:  Outpatient Encounter Prescriptions as of 03/08/2015  Medication Sig  . aspirin 81 MG tablet Take 81 mg by mouth daily.    . celecoxib (CELEBREX) 100 MG capsule Take 100 mg by mouth 2 (two) times daily.   Mariane Baumgarten Calcium (STOOL SOFTENER PO) Take by mouth 2 (two) times daily. Takes 2 in the morning.  . gabapentin (NEURONTIN) 300 MG capsule Take 600 mg by mouth at bedtime. Take 900 mg as needed  . Multiple Vitamins-Minerals (CENTRUM SILVER PO) Take by mouth.    . olmesartan (BENICAR) 40 MG tablet Take 40 mg by mouth daily.    . Omega-3 Fatty Acids (FISH OIL) 1000 MG CAPS Take 1,200 mg by mouth 3 (three) times daily. Take 2 capsules by mouth in the morning and 1 in the evening.  Marland Kitchen omeprazole (PRILOSEC) 20 MG capsule Take 20 mg by mouth 2 (two) times daily.    . pravastatin (PRAVACHOL) 20 MG tablet Take 20 mg by mouth daily.  . [DISCONTINUED] peg 3350 powder (MOVIPREP) 100 G SOLR Take 1 kit (200 g total) by mouth as directed.   No facility-administered encounter medications on file as of 03/08/2015.  :  Review of Systems:  Out of a complete 14 point review of systems, all are reviewed and negative with the exception of these symptoms as listed below:   Review of Systems  Neurological:       Memory loss, wife and patient state that there is no improvement. Restless Legs.   Patient states that he has some trouble adjusting to his BiPAP machine. He states that his mask is uncomfortable.     Objective:  Neurologic Exam  Physical Exam Physical Examination:   Filed Vitals:   03/08/15 1608  BP: 128/58  Pulse: 70  Resp: 18    General Examination: The patient is a very pleasant 69 y.o. male in no acute distress. He appears well-developed and well-nourished and well  groomed.    HEENT: Normocephalic, atraumatic, pupils are equal, round and reactive to light and accommodation. Extraocular tracking is good without limitation to gaze excursion or nystagmus noted.  Normal smooth pursuit is noted. He is hard of hearing. He has bilateral hearing aids in place. Face is symmetric with normal facial animation and normal facial sensation. Speech is clear with no dysarthria noted. There is no hypophonia. There is no lip, neck/head, jaw or voice tremor. Neck is supple with full range of passive and active motion. There are no carotid bruits on auscultation. Oropharynx exam reveals: mild mouth dryness, adequate dental hygiene and moderate airway crowding.   Chest: Clear to auscultation without wheezing, rhonchi or crackles noted.  Heart: S1+S2+0, regular and normal without murmurs, rubs or gallops noted.   Abdomen: Soft, non-tender and non-distended with normal bowel sounds appreciated on auscultation.  Extremities: There is no pitting edema in the distal lower extremities bilaterally. Pedal pulses are intact.  Skin: Warm and dry without trophic changes noted. There are no varicose veins.  Musculoskeletal: exam reveals no obvious joint deformities, tenderness or joint swelling or erythema. He reports bilateral knee pain, right more than left.  Neurologically:  Mental status: The patient is awake, alert and oriented in all 4 spheres. His immediate and remote memory, attention, language skills and fund of knowledge are fairly appropriate. There is no evidence of aphasia, agnosia, apraxia or anomia. Speech is clear with normal prosody and enunciation. Thought process is linear. Mood is normal and affect is normal.   On 03/08/2015: [facility] 26/30.  Cranial nerves II - XII are as described above under HEENT exam. In addition: shoulder shrug is normal with equal shoulder height noted. Motor exam: Normal bulk, strength and tone is noted. There is no drift, tremor or rebound. Romberg is negative. Reflexes are 2+ throughout with the exception of absent ankle jerks bilaterally. Babinski: Toes are flexor bilaterally. Fine motor skills and coordination: intact with normal finger  taps, normal hand movements, normal rapid alternating patting, normal foot taps and normal foot agility.  Cerebellar testing: No dysmetria or intention tremor on finger to nose testing. Heel to shin is unremarkable bilaterally. There is no truncal or gait ataxia.  Sensory exam: intact to light touch, pinprick, vibration, temperature sense in the upper and evidence of decrease in pinprick sensation and vibration sense in the distal lower extremities, unchanged.  Gait, station and balance: He stands easily. No veering to one side is noted. No leaning to one side is noted. Posture is age-appropriate and stance is narrow based. Gait shows normal stride length and normal pace. No problems turning are noted. He turns en bloc.  Heel to shin is somewhat challenging for him.              Assessment and Plan:   In summary, OSTEN JANEK is a very pleasant 69 year old male with an underlying medical history of neuropathy, obesity and restless leg syndrome, who presents for followup consultation of his obstructive sleep apnea. He had overall mild obstructive sleep apnea but significant desaturations and low average oxygen saturation was which all improved with treatment with standard BiPAP therapy. He could not tolerate CPAP. He is compliant with treatment and is congratulated on his treatment adherence. Overall, since his treatment was initiated for sleep apnea, his sleep consolidation has improved, snoring is eliminated and his restless leg symptoms and leg twitching improved. Memory loss is for the most part stable, his MOCA score seems to be stable. Today,  I suggested that we  Pursue  Formal neuropsychological evaluation. He was in agreement. I made a referral in that regard. I asked him to continue to try to lose weight and exercise regularly. He is advised to continue to be compliant with BiPAP treatment. He is encouraged to drink more water. I did not suggest any new medications. I will see him back routinely in  about 6 months, sooner if needed. I answered all their questions today and the patient and his wife were in agreement. I spent 25 minutes in total face-to-face time with the patient, more than 50% of which was spent in counseling and coordination of care, reviewing test results, reviewing medication and discussing or reviewing the diagnosis of OSA and memory loss, the prognosis and treatment options.

## 2015-03-08 NOTE — Patient Instructions (Signed)
You are doing well with BiPAP.  Drink more water!  Try to lose weight.  We will request formal cognitive testing with a neuropsychologist, Dr. Valentina Shaggy.  Follow up in 6 months.

## 2015-03-09 ENCOUNTER — Ambulatory Visit: Payer: Medicare (Managed Care) | Admitting: Neurology

## 2015-03-15 ENCOUNTER — Encounter (HOSPITAL_COMMUNITY)
Admission: RE | Disposition: A | Discharge status: Home / Self Care | Payer: Self-pay | Source: Ambulatory Visit | Attending: Internal Medicine

## 2015-03-15 ENCOUNTER — Encounter (HOSPITAL_COMMUNITY): Payer: Self-pay | Admitting: *Deleted

## 2015-03-15 ENCOUNTER — Ambulatory Visit (HOSPITAL_COMMUNITY)
Admission: RE | Admit: 2015-03-15 | Discharge: 2015-03-15 | Disposition: A | Discharge status: Home / Self Care | Payer: Medicare Other | Source: Ambulatory Visit | Attending: Internal Medicine | Admitting: Internal Medicine

## 2015-03-15 DIAGNOSIS — K449 Diaphragmatic hernia without obstruction or gangrene: Secondary | ICD-10-CM | POA: Insufficient documentation

## 2015-03-15 DIAGNOSIS — Z79899 Other long term (current) drug therapy: Secondary | ICD-10-CM | POA: Insufficient documentation

## 2015-03-15 DIAGNOSIS — Z7982 Long term (current) use of aspirin: Secondary | ICD-10-CM | POA: Diagnosis not present

## 2015-03-15 DIAGNOSIS — Z87891 Personal history of nicotine dependence: Secondary | ICD-10-CM | POA: Insufficient documentation

## 2015-03-15 DIAGNOSIS — Z86010 Personal history of colonic polyps: Secondary | ICD-10-CM

## 2015-03-15 DIAGNOSIS — I10 Essential (primary) hypertension: Secondary | ICD-10-CM | POA: Insufficient documentation

## 2015-03-15 DIAGNOSIS — Z1211 Encounter for screening for malignant neoplasm of colon: Secondary | ICD-10-CM | POA: Diagnosis not present

## 2015-03-15 DIAGNOSIS — Q438 Other specified congenital malformations of intestine: Secondary | ICD-10-CM | POA: Insufficient documentation

## 2015-03-15 DIAGNOSIS — G4730 Sleep apnea, unspecified: Secondary | ICD-10-CM | POA: Insufficient documentation

## 2015-03-15 DIAGNOSIS — D123 Benign neoplasm of transverse colon: Secondary | ICD-10-CM | POA: Diagnosis not present

## 2015-03-15 DIAGNOSIS — K5730 Diverticulosis of large intestine without perforation or abscess without bleeding: Secondary | ICD-10-CM | POA: Insufficient documentation

## 2015-03-15 DIAGNOSIS — K219 Gastro-esophageal reflux disease without esophagitis: Secondary | ICD-10-CM | POA: Diagnosis not present

## 2015-03-15 HISTORY — PX: COLONOSCOPY: SHX5424

## 2015-03-15 HISTORY — PX: ESOPHAGOGASTRODUODENOSCOPY: SHX5428

## 2015-03-15 SURGERY — COLONOSCOPY
Anesthesia: Moderate Sedation

## 2015-03-15 MED ORDER — MEPERIDINE HCL 100 MG/ML IJ SOLN
INTRAMUSCULAR | Status: AC
  Filled 2015-03-15: qty 2

## 2015-03-15 MED ORDER — ONDANSETRON HCL 4 MG/2ML IJ SOLN
INTRAMUSCULAR | Status: DC
Start: 2015-03-15 — End: 2015-03-15
  Filled 2015-03-15: qty 2

## 2015-03-15 MED ORDER — MIDAZOLAM HCL 5 MG/5ML IJ SOLN
INTRAMUSCULAR | Status: DC | PRN
  Administered 2015-03-15: 2 mg via INTRAVENOUS
  Administered 2015-03-15 (×2): 1 mg via INTRAVENOUS

## 2015-03-15 MED ORDER — STERILE WATER FOR IRRIGATION IR SOLN
Status: DC | PRN
  Administered 2015-03-15: 13:00:00

## 2015-03-15 MED ORDER — SODIUM CHLORIDE 0.9 % IV SOLN
INTRAVENOUS | Status: DC
  Administered 2015-03-15: 12:00:00 via INTRAVENOUS

## 2015-03-15 MED ORDER — LIDOCAINE VISCOUS 2 % MT SOLN
OROMUCOSAL | Status: DC | PRN
  Administered 2015-03-15: 1 via OROMUCOSAL

## 2015-03-15 MED ORDER — MEPERIDINE HCL 100 MG/ML IJ SOLN
INTRAMUSCULAR | Status: DC | PRN
  Administered 2015-03-15: 50 mg
  Administered 2015-03-15: 25 mg via INTRAVENOUS

## 2015-03-15 MED ORDER — LIDOCAINE VISCOUS 2 % MT SOLN
OROMUCOSAL | Status: AC
  Filled 2015-03-15: qty 15

## 2015-03-15 MED ORDER — MIDAZOLAM HCL 5 MG/5ML IJ SOLN
INTRAMUSCULAR | Status: AC
  Filled 2015-03-15: qty 10

## 2015-03-15 MED ORDER — ONDANSETRON HCL 4 MG/2ML IJ SOLN
INTRAMUSCULAR | Status: DC | PRN
  Administered 2015-03-15: 4 mg via INTRAVENOUS

## 2015-03-15 NOTE — Op Note (Signed)
[name], [address]   ENDOSCOPY PROCEDURE REPORT  PATIENT: Dakota Thompson, Dakota Thompson  MR#: [id number] BIRTHDATE: 1945/09/09 , 68  yrs. old GENDER: male ENDOSCOPIST: R.  Garfield Cornea, MD FACP FACG REFERRED BY:  Kerin Perna, M.D. PROCEDURE DATE:  03/28/15 PROCEDURE:  EGD, diagnostic INDICATIONS:  Long-standing GERD. MEDICATIONS: Versed 4 mg IV and Demerol 75 mg IV in divided doses. Xylocaine gel orally.  Zofran 4 mg IV. ASA CLASS:      Class II  CONSENT: The risks, benefits, limitations, alternatives and imponderables have been discussed.  The potential for biopsy, esophogeal dilation, etc. have also been reviewed.  Questions have been answered.  All parties agreeable.  Please see the history and physical in the medical record for more information.  DESCRIPTION OF PROCEDURE: After the risks benefits and alternatives of the procedure were thoroughly explained, informed consent was obtained.  The EG-[id number] ([id number]) endoscope was introduced through the mouth and advanced to the second portion of the duodenum , limited by Without limitations. The instrument was slowly withdrawn as the mucosa was fully examined. Estimated blood loss is zero unless otherwise noted in this procedure report.    Normal-appearing tubular esophagus.  No esophagitis.  No evidence of Barrett's epithelium.  Stomach empty.  2 cm hiatal hernia. Normal-appearing gastric mucosa.  Patent pylorus.  Normal-appearing first and second portion of the duodenum.  Retroflexed views revealed a hiatal hernia.     The scope was then withdrawn from the patient and the procedure completed.  COMPLICATIONS: There were no immediate complications.  ENDOSCOPIC IMPRESSION: 2 cm hiatal hernia; otherwise normal EGD  RECOMMENDATIONS: Continue Prilosec 20 mg twice a day. See colonoscopy report.   REPEAT EXAM:  eSigned:  R. Garfield Cornea, MD Rosalita [facility]ss[facility] 2015-03-28 1:05 PM    CC:  CPT  CODES: ICD CODES:  The ICD and CPT codes recommended by this software are interpretations from the data that the clinical staff has captured with the software.  The verification of the translation of this report to the ICD and CPT codes and modifiers is the sole responsibility of the health care institution and practicing physician where this report was generated.  [facility]. will not be held responsible for the validity of the ICD and CPT codes included on this report.  AMA assumes no liability for data contained or not contained herein. CPT is a Designer, television/film set of the [employer].  PATIENT NAME:  Tiran, Sauseda MR#: [id number]

## 2015-03-15 NOTE — Interval H&P Note (Signed)
History and Physical Interval Note:  03/15/2015 12:45 PM  Dakota Thompson  has presented today for surgery, with the diagnosis of GERD/HISTORY OF POLYPS  The various methods of treatment have been discussed with the patient and family. After consideration of risks, benefits and other options for treatment, the patient has consented to  Procedure(s) with comments: COLONOSCOPY (N/A) - 1215 - moved to 12:45 - office to notify ESOPHAGOGASTRODUODENOSCOPY (EGD) (N/A) as a surgical intervention .  The patient's history has been reviewed, patient examined, no change in status, stable for surgery.  I have reviewed the patient's chart and labs.  Questions were answered to the patient's satisfaction.     Robert Rourk  No change. Patient denies dysphagia. Plan for screening EGD and surveillance colonoscopy.  The risks, benefits, limitations, imponderables and alternatives regarding both EGD and colonoscopy have been reviewed with the patient. Questions have been answered. All parties agreeable.

## 2015-03-15 NOTE — H&P (View-Only) (Signed)
Primary Care Physician:  Glo Herring., MD  Primary Gastroenterologist:  Garfield Cornea, MD   Chief Complaint  Patient presents with  . set up procedures    HPI:  Dakota Thompson is a 69 y.o. male here to reschedule his EGD and surveillance colonoscopy. He was noncompliant with dietary changes preceding his colonoscopy therefore his procedure had to be canceled last month. States he misread the instructions.   Last colonoscopy was July 2011. Marginal prep compromised exam. Patient reports having the MiraLAX prep at that time. Colon was normal. 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  He has something outside his anal opening which he wonders what it is. Doesn't seem to be bothering him but he would like it to be looked at it time of his colonoscopy.  Typically has bowel movement most days with some straining, smaller stools. Uses 2 stool softeners daily. No blood in the stool or melena. Feels like incomplete evacuation. Using MiraLAX 17 g daily as needed since his last office visit. Heartburn controlled most of the time depending on what he eats. If he eats something that typically causes heartburn he will have breakthrough symptoms in the evening. Denies dysphagia, vomiting, abdominal pain, melena, rectal bleeding. He's never had an EGD. Chronic GERD for over 8 years.  Current Outpatient Prescriptions  Medication Sig Dispense Refill  . aspirin 81 MG tablet Take 81 mg by mouth daily.      . celecoxib (CELEBREX) 100 MG capsule Take 100 mg by mouth 2 (two) times daily.     Mariane Baumgarten Calcium (STOOL SOFTENER PO) Take by mouth 2 (two) times daily. Takes 2 in the morning.    . gabapentin (NEURONTIN) 300 MG capsule Take 600 mg by mouth at bedtime. Take 900 mg as needed    . Multiple Vitamins-Minerals (CENTRUM SILVER PO) Take by mouth.      . olmesartan (BENICAR) 40 MG tablet Take 40 mg by mouth daily.       . Omega-3 Fatty Acids (FISH OIL) 1000 MG CAPS Take 1,200 mg by mouth 3 (three) times daily. Take 2 capsules by mouth in the morning and 1 in the evening.    Marland Kitchen omeprazole (PRILOSEC) 20 MG capsule Take 20 mg by mouth 2 (two) times daily.      . pravastatin (PRAVACHOL) 20 MG tablet Take 20 mg by mouth daily.     No current facility-administered medications for this visit.    Allergies as of 02/20/2015  . (No Known Allergies)    Past Medical History  Diagnosis Date  . Hypertension   . Pancytopenia     mild  . 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 Restless leg syndrome   . Viral syndrome     Hx  . Pancytopenia 02/27/2011  . Viral syndrome 02/27/2011  . Thyroid disease     "normal now"  . Hx of adenomatous colonic polyps     tubulovillous adenoma  . Sleep apnea     wears CPAP/BIPAP every night    Past Surgical History  Procedure Laterality Date  . Ganglion cyst excision    . Nasal sinus surgery    . Knee cartilage repair  01/31/11  . Colonoscopy  01/02/2010    RMR: normal rectum and colon. marginal prep compromised exam.     Family History  Problem Relation Age of Onset  . Colon cancer  Neg Hx   . Cancer Brother     unknown type  . Cancer Sister     unknown type    Social History   Social History  . Marital Status: Married    Spouse Name: Adela Lank   . Number of Children: 2  . Years of Education: N/A   Occupational History  . Retired    Social History Main Topics  . Smoking status: Former Smoker    Types: Cigarettes    Quit date: 05/22/1981  . Smokeless tobacco: Never Used  . Alcohol Use: No  . Drug Use: No  . Sexual Activity: Not on file   Other Topics Concern  . Not on file   Social History Narrative   Patient lives at home with his wife.    Patient has 2 children.    Patient is retired.       ROS:  General: Negative for anorexia, weight loss, fever, chills, fatigue, weakness. Eyes: Negative for vision  changes.  ENT: Negative for hoarseness, difficulty swallowing , nasal congestion. CV: Negative for chest pain, angina, palpitations, dyspnea on exertion, peripheral edema.  Respiratory: Negative for dyspnea at rest, dyspnea on exertion, cough, sputum, wheezing.  GI: See history of present illness. GU:  Negative for dysuria, hematuria, urinary incontinence, urinary frequency, nocturnal urination.  MS: Negative for joint pain, low back pain.  Derm: Negative for rash or itching.  Neuro: Negative for weakness, abnormal sensation, seizure, frequent headaches, memory loss, confusion.  Psych: Negative for anxiety, depression, suicidal ideation, hallucinations.  Endo: Negative for unusual weight change.  Heme: Negative for bruising or bleeding. Allergy: Negative for rash or hives.    Physical Examination:  BP 142/75 mmHg  Pulse 71  Temp(Src) 97.6 F (36.4 C)  Ht 5\' 9"  (1.753 m)  Wt 249 lb 9.6 oz (113.218 kg)  BMI 36.84 kg/m2   General: Well-nourished, well-developed in no acute distress.  Head: Normocephalic, atraumatic.   Eyes: Conjunctiva pink, no icterus. Mouth: Oropharyngeal mucosa moist and pink , no lesions erythema or exudate. Neck: Supple without thyromegaly, masses, or lymphadenopathy.  Lungs: Clear to auscultation bilaterally.  Heart: Regular rate and rhythm, no murmurs rubs or gallops.  Abdomen: Bowel sounds are normal, nontender, nondistended, no hepatosplenomegaly or masses, no abdominal bruits or    hernia , no rebound or guarding.   Rectal: not performed Extremities: No lower extremity edema. No clubbing or deformities.  Neuro: Alert and oriented x 4 , grossly normal neurologically.  Skin: Warm and dry, no rash or jaundice.   Psych: Alert and cooperative, normal mood and affect.   Imaging Studies: No results found.

## 2015-03-15 NOTE — Discharge Instructions (Signed)
Colonoscopy Discharge Instructions  Read the instructions outlined below and refer to this sheet in the next few weeks. These discharge instructions provide you with general information on caring for yourself after you leave the hospital. Your doctor may also give you specific instructions. While your treatment has been planned according to the most current medical practices available, unavoidable complications occasionally occur. If you have any problems or questions after discharge, call Dr. Gala Romney at [phone]. ACTIVITY  You may resume your regular activity, but move at a slower pace for the next 24 hours.   Take frequent rest periods for the next 24 hours.   Walking will help get rid of the air and reduce the bloated feeling in your belly (abdomen).   No driving for 24 hours (because of the medicine (anesthesia) used during the test).    Do not sign any important legal documents or operate any machinery for 24 hours (because of the anesthesia used during the test).  NUTRITION  Drink plenty of fluids.   You may resume your normal diet as instructed by your doctor.   Begin with a light meal and progress to your normal diet. Heavy or fried foods are harder to digest and may make you feel sick to your stomach (nauseated).   Avoid alcoholic beverages for 24 hours or as instructed.  MEDICATIONS  You may resume your normal medications unless your doctor tells you otherwise.  WHAT YOU CAN EXPECT TODAY  Some feelings of bloating in the abdomen.   Passage of more gas than usual.   Spotting of blood in your stool or on the toilet paper.  IF YOU HAD POLYPS REMOVED DURING THE COLONOSCOPY:  No aspirin products for 7 days or as instructed.   No alcohol for 7 days or as instructed.   Eat a soft diet for the next 24 hours.  FINDING OUT THE RESULTS OF YOUR TEST Not all test results are available during your visit. If your test results are not back during the visit, make an appointment  with your caregiver to find out the results. Do not assume everything is normal if you have not heard from your caregiver or the medical facility. It is important for you to follow up on all of your test results.  SEEK IMMEDIATE MEDICAL ATTENTION IF:  You have more than a spotting of blood in your stool.   Your belly is swollen (abdominal distention).   You are nauseated or vomiting.   You have a temperature over 101.  You have abdominal pain or discomfort that is severe or gets worse throughout the day. EGD Discharge instructions Please read the instructions outlined below and refer to this sheet in the next few weeks. These discharge instructions provide you with general information on caring for yourself after you leave the hospital. Your doctor may also give you specific instructions. While your treatment has been planned according to the most current medical practices available, unavoidable complications occasionally occur. If you have any problems or questions after discharge, please call your doctor. ACTIVITY You may resume your regular activity but move at a slower pace for the next 24 hours.  Take frequent rest periods for the next 24 hours.  Walking will help expel (get rid of) the air and reduce the bloated feeling in your abdomen.  No driving for 24 hours (because of the anesthesia (medicine) used during the test).  You may shower.  Do not sign any important legal documents or operate any machinery for 24  hours (because of the anesthesia used during the test).  NUTRITION Drink plenty of fluids.  You may resume your normal diet.  Begin with a light meal and progress to your normal diet.  Avoid alcoholic beverages for 24 hours or as instructed by your caregiver.  MEDICATIONS You may resume your normal medications unless your caregiver tells you otherwise.  WHAT YOU CAN EXPECT TODAY You may experience abdominal discomfort such as a feeling of fullness or gas pains.   FOLLOW-UP Your doctor will discuss the results of your test with you.  SEEK IMMEDIATE MEDICAL ATTENTION IF ANY OF THE FOLLOWING OCCUR: Excessive nausea (feeling sick to your stomach) and/or vomiting.  Severe abdominal pain and distention (swelling).  Trouble swallowing.  Temperature over 101 F (37.8 C).  Rectal bleeding or vomiting of blood.    GERD and hiatal hernia information provided  Colon polyp and diverticulosis information provided  Continue Prilosec 20 mg twice daily  Further recommendations to follow pending review of pathology report  Hiatal Hernia A hiatal hernia occurs when part of your stomach slides above the muscle that separates your abdomen from your chest (diaphragm). You can be born with a hiatal hernia (congenital), or it may develop over time. In almost all cases of hiatal hernia, only the top part of the stomach pushes through.  Many people have a hiatal hernia with no symptoms. The larger the hernia, the more likely that you will have symptoms. In some cases, a hiatal hernia allows stomach acid to flow back into the tube that carries food from your mouth to your stomach (esophagus). This may cause heartburn symptoms. Severe heartburn symptoms may mean you have developed a condition called gastroesophageal reflux disease (GERD).  CAUSES  Hiatal hernias are caused by a weakness in the opening (hiatus) where your esophagus passes through your diaphragm to attach to the upper part of your stomach. You may be born with a weakness in your hiatus, or a weakness can develop. RISK FACTORS Older age is a major risk factor for a hiatal hernia. Anything that increases pressure on your diaphragm can also increase your risk of a hiatal hernia. This includes:  Pregnancy.  Excess weight.  Frequent constipation. SIGNS AND SYMPTOMS  People with a hiatal hernia often have no symptoms. If symptoms develop, they are almost always caused by GERD. They may  include:  Heartburn.  Belching.  Indigestion.  Trouble swallowing.  Coughing or wheezing.  Sore throat.  Hoarseness.  Chest pain. DIAGNOSIS  A hiatal hernia is sometimes found during an exam for another problem. Your health care provider may suspect a hiatal hernia if you have symptoms of GERD. Tests may be done to diagnose GERD. These may include:  X-rays of your stomach or chest.  An upper gastrointestinal (GI) series. This is an X-ray exam of your GI tract involving the use of a chalky liquid that you swallow. The liquid shows up clearly on the X-ray.  Endoscopy. This is a procedure to look into your stomach using a thin, flexible tube that has a tiny camera and light on the end of it. TREATMENT  If you have no symptoms, you may not need treatment. If you have symptoms, treatment may include:  Dietary and lifestyle changes to help reduce GERD symptoms.  Medicines. These may include:  Over-the-counter antacids.  Medicines that make your stomach empty more quickly.  Medicines that block the production of stomach acid (H2 blockers).  Stronger medicines to reduce stomach acid (proton pump inhibitors).  You may need surgery to repair the hernia if other treatments are not helping. HOME CARE INSTRUCTIONS   Take all medicines as directed by your health care provider.  Quit smoking, if you smoke.  Try to achieve and maintain a healthy body weight.  Eat frequent small meals instead of three large meals a day. This keeps your stomach from getting too full.  Eat slowly.  Do not lie down right after eating.  Do noteat 1-2 hours before bed.   Do not drink beverages with caffeine. These include cola, coffee, cocoa, and tea.  Do not drink alcohol.  Avoid foods that can make symptoms of GERD worse. These may include:  Fatty foods.  Citrus fruits.  Other foods and drinks that contain acid.  Avoid putting pressure on your belly. Anything that puts pressure on  your belly increases the amount of acid that may be pushed up into your esophagus.   Avoid bending over, especially after eating.  Raise the head of your bed by putting blocks under the legs. This keeps your head and esophagus higher than your stomach.  Do not wear tight clothing around your chest or stomach.  Try not to strain when having a bowel movement, when urinating, or when lifting heavy objects. SEEK MEDICAL CARE IF:  Your symptoms are not controlled with medicines or lifestyle changes.  You are having trouble swallowing.  You have coughing or wheezing that will not go away. SEEK IMMEDIATE MEDICAL CARE IF:  Your pain is getting worse.  Your pain spreads to your arms, neck, jaw, teeth, or back.  You have shortness of breath.  You sweat for no reason.  You feel sick to your stomach (nauseous) or vomit.  You vomit blood.  You have bright red blood in your stools.  You have black, tarry stools.  Document Released: 08/30/2003 Document Revised: 10/24/2013 Document Reviewed: 05/27/2013 [facility] Patient Information 2015 [place], [place]. This information is not intended to replace advice given to you by your health care provider. Make sure you discuss any questions you have with your health care provider.    Gastroesophageal Reflux Disease, Adult Gastroesophageal reflux disease (GERD) happens when acid from your stomach flows up into the esophagus. When acid comes in contact with the esophagus, the acid causes soreness (inflammation) in the esophagus. Over time, GERD may create small holes (ulcers) in the lining of the esophagus. CAUSES   Increased body weight. This puts pressure on the stomach, making acid rise from the stomach into the esophagus.  Smoking. This increases acid production in the stomach.  Drinking alcohol. This causes decreased pressure in the lower esophageal sphincter (valve or ring of muscle between the esophagus and stomach), allowing acid from the  stomach into the esophagus.  Late evening meals and a full stomach. This increases pressure and acid production in the stomach.  A malformed lower esophageal sphincter. Sometimes, no cause is found. SYMPTOMS   Burning pain in the lower part of the mid-chest behind the breastbone and in the mid-stomach area. This may occur twice a week or more often.  Trouble swallowing.  Sore throat.  Dry cough.  Asthma-like symptoms including chest tightness, shortness of breath, or wheezing. DIAGNOSIS  Your caregiver may be able to diagnose GERD based on your symptoms. In some cases, X-rays and other tests may be done to check for complications or to check the condition of your stomach and esophagus. TREATMENT  Your caregiver may recommend over-the-counter or prescription medicines to help decrease acid  production. Ask your caregiver before starting or adding any new medicines.  HOME CARE INSTRUCTIONS   Change the factors that you can control. Ask your caregiver for guidance concerning weight loss, quitting smoking, and alcohol consumption.  Avoid foods and drinks that make your symptoms worse, such as:  Caffeine or alcoholic drinks.  Chocolate.  Peppermint or mint flavorings.  Garlic and onions.  Spicy foods.  Citrus fruits, such as oranges, lemons, or limes.  Tomato-based foods such as sauce, chili, salsa, and pizza.  Fried and fatty foods.  Avoid lying down for the 3 hours prior to your bedtime or prior to taking a nap.  Eat small, frequent meals instead of large meals.  Wear loose-fitting clothing. Do not wear anything tight around your waist that causes pressure on your stomach.  Raise the head of your bed 6 to 8 inches with wood blocks to help you sleep. Extra pillows will not help.  Only take over-the-counter or prescription medicines for pain, discomfort, or fever as directed by your caregiver.  Do not take aspirin, ibuprofen, or other nonsteroidal anti-inflammatory  drugs (NSAIDs). SEEK IMMEDIATE MEDICAL CARE IF:   You have pain in your arms, neck, jaw, teeth, or back.  Your pain increases or changes in intensity or duration.  You develop nausea, vomiting, or sweating (diaphoresis).  You develop shortness of breath, or you faint.  Your vomit is green, yellow, black, or looks like coffee grounds or blood.  Your stool is red, bloody, or black. These symptoms could be signs of other problems, such as heart disease, gastric bleeding, or esophageal bleeding. MAKE SURE YOU:   Understand these instructions.  Will watch your condition.  Will get help right away if you are not doing well or get worse. Document Released: 03/19/2005 Document Revised: 09/01/2011 Document Reviewed: 12/27/2010 [facility] Patient Information 2015 [place], [place]. This information is not intended to replace advice given to you by your health care provider. Make sure you discuss any questions you have with your health care provider.    Diverticulosis Diverticulosis is the condition that develops when small pouches (diverticula) form in the wall of your colon. Your colon, or large intestine, is where water is absorbed and stool is formed. The pouches form when the inside layer of your colon pushes through weak spots in the outer layers of your colon. CAUSES  No one knows exactly what causes diverticulosis. RISK FACTORS  Being older than 14. Your risk for this condition increases with age. Diverticulosis is rare in people younger than 40 years. By age 21, almost everyone has it.  Eating a low-fiber diet.  Being frequently constipated.  Being overweight.  Not getting enough exercise.  Smoking.  Taking over-the-counter pain medicines, like aspirin and ibuprofen. SYMPTOMS  Most people with diverticulosis do not have symptoms. DIAGNOSIS  Because diverticulosis often has no symptoms, health care providers often discover the condition during an exam for other colon  problems. In many cases, a health care provider will diagnose diverticulosis while using a flexible scope to examine the colon (colonoscopy). TREATMENT  If you have never developed an infection related to diverticulosis, you may not need treatment. If you have had an infection before, treatment may include:  Eating more fruits, vegetables, and grains.  Taking a fiber supplement.  Taking a live bacteria supplement (probiotic).  Taking medicine to relax your colon. HOME CARE INSTRUCTIONS   Drink at least 6-8 glasses of water each day to prevent constipation.  Try not to strain when you  have a bowel movement.  Keep all follow-up appointments. If you have had an infection before:  Increase the fiber in your diet as directed by your health care provider or dietitian.  Take a dietary fiber supplement if your health care provider approves.  Only take medicines as directed by your health care provider. SEEK MEDICAL CARE IF:   You have abdominal pain.  You have bloating.  You have cramps.  You have not gone to the bathroom in 3 days. SEEK IMMEDIATE MEDICAL CARE IF:   Your pain gets worse.  Yourbloating becomes very bad.  You have a fever or chills, and your symptoms suddenly get worse.  You begin vomiting.  You have bowel movements that are bloody or black. MAKE SURE YOU:  Understand these instructions.  Will watch your condition.  Will get help right away if you are not doing well or get worse. Document Released: 03/06/2004 Document Revised: 06/14/2013 Document Reviewed: 05/04/2013 [facility] Patient Information 2015 [place], [place]. This information is not intended to replace advice given to you by your health care provider. Make sure you discuss any questions you have with your health care provider.   Colon Polyps Polyps are lumps of extra tissue growing inside the body. Polyps can grow in the large intestine (colon). Most colon polyps are noncancerous (benign).  However, some colon polyps can become cancerous over time. Polyps that are larger than a pea may be harmful. To be safe, caregivers remove and test all polyps. CAUSES  Polyps form when mutations in the genes cause your cells to grow and divide even though no more tissue is needed. RISK FACTORS There are a number of risk factors that can increase your chances of getting colon polyps. They include:  Being older than 50 years.  Family history of colon polyps or colon cancer.  Long-term colon diseases, such as colitis or Crohn disease.  Being overweight.  Smoking.  Being inactive.  Drinking too much alcohol. SYMPTOMS  Most small polyps do not cause symptoms. If symptoms are present, they may include:  Blood in the stool. The stool may look dark red or black.  Constipation or diarrhea that lasts longer than 1 week. DIAGNOSIS People often do not know they have polyps until their caregiver finds them during a regular checkup. Your caregiver can use 4 tests to check for polyps:  Digital rectal exam. The caregiver wears gloves and feels inside the rectum. This test would find polyps only in the rectum.  Barium enema. The caregiver puts a liquid called barium into your rectum before taking X-rays of your colon. Barium makes your colon look white. Polyps are dark, so they are easy to see in the X-ray pictures.  Sigmoidoscopy. A thin, flexible tube (sigmoidoscope) is placed into your rectum. The sigmoidoscope has a light and tiny camera in it. The caregiver uses the sigmoidoscope to look at the last third of your colon.  Colonoscopy. This test is like sigmoidoscopy, but the caregiver looks at the entire colon. This is the most common method for finding and removing polyps. TREATMENT  Any polyps will be removed during a sigmoidoscopy or colonoscopy. The polyps are then tested for cancer. PREVENTION  To help lower your risk of getting more colon polyps:  Eat plenty of fruits and vegetables.  Avoid eating fatty foods.  Do not smoke.  Avoid drinking alcohol.  Exercise every day.  Lose weight if recommended by your caregiver.  Eat plenty of calcium and folate. Foods that are rich in  calcium include milk, cheese, and broccoli. Foods that are rich in folate include chickpeas, kidney beans, and spinach. HOME CARE INSTRUCTIONS Keep all follow-up appointments as directed by your caregiver. You may need periodic exams to check for polyps. SEEK MEDICAL CARE IF: You notice bleeding during a bowel movement. Document Released: 03/05/2004 Document Revised: 09/01/2011 Document Reviewed: 08/19/2011 [facility] Patient Information 2015 [place], [place]. This information is not intended to replace advice given to you by your health care provider. Make sure you discuss any questions you have with your health care provider.

## 2015-03-15 NOTE — Op Note (Signed)
[facility] [address] [place], [address]   COLONOSCOPY PROCEDURE REPORT  PATIENT: Dakota Thompson, Dakota Thompson  MR#: [id number] BIRTHDATE: 1946/03/05 , 28  yrs. old GENDER: male ENDOSCOPIST: R.  Garfield Cornea, MD FACP [facility] REFERRED XI:PJASN Gerarda Fraction, M.D. PROCEDURE DATE:  03/17/15 PROCEDURE:   Colonoscopy with snare polypectomy INDICATIONS:History of tubulovillous adenoma; surveillance examination. MEDICATIONS: Versed 5 mg IV and Demerol 100 mg IV in divided doses. Xylocaine gel orally.  Zofran 4 mg IV. ASA CLASS:       Class II  CONSENT: The risks, benefits, alternatives and imponderables including but not limited to bleeding, perforation as well as the possibility of a missed lesion have been reviewed.  The potential for biopsy, lesion removal, etc. have also been discussed. Questions have been answered.  All parties agreeable.  Please see the history and physical in the medical record for more information.  DESCRIPTION OF PROCEDURE:   After the risks benefits and alternatives of the procedure were thoroughly explained, informed consent was obtained.  The digital rectal exam a couple of anal papilla and external hemorrhoidal tag.        The EG-[id number] ([id number])  endoscope was introduced through the anus and advanced to the cecum, which was identified by both the appendix and ileocecal valve. No adverse events experienced.   The quality of the prep was adequate  The instrument was then slowly withdrawn as the colon was fully examined. Estimated blood loss is zero unless otherwise noted in this procedure report.      COLON FINDINGS: Anal papilla and external hemorrhoid tag; otherwise, normal-appearing rectal mucosa.  Redundant colon.  Scattered sigmoid diverticula; (1) 5 mm polyp at the hepatic flexure; otherwise, the remainder of the colonic mucosa appeared normal.  The above-mentioned polyp was cold snare removed and recovered for the pathologist.  Retroflexion  was performed. .  Withdrawal time=14 minutes 0 seconds.  The scope was withdrawn and the procedure completed. COMPLICATIONS: There were no immediate complications. EBL 3 mL ENDOSCOPIC IMPRESSION: Colonic polyp?"removed as described above. Colonic diverticulosis. Redundant colon.  RECOMMENDATIONS: Follow-up on pathology. See EGD report.  eSigned:  R. Garfield Cornea, MD [facility] 03/17/15 1:32 PM   cc:  CPT CODES: ICD CODES:  The ICD and CPT codes recommended by this software are interpretations from the data that the clinical staff has captured with the software.  The verification of the translation of this report to the ICD and CPT codes and modifiers is the sole responsibility of the health care institution and practicing physician where this report was generated.  [employer] will not be held responsible for the validity of the ICD and CPT codes included on this report.  AMA assumes no liability for data contained or not contained herein. CPT is a Designer, television/film set of the [employer].

## 2015-03-19 ENCOUNTER — Encounter: Payer: Self-pay | Admitting: Internal Medicine

## 2015-03-21 ENCOUNTER — Encounter (HOSPITAL_COMMUNITY): Payer: Self-pay | Admitting: Internal Medicine

## 2015-06-11 ENCOUNTER — Ambulatory Visit: Payer: Medicare Other | Attending: Psychology | Admitting: Psychology

## 2015-06-11 ENCOUNTER — Encounter: Payer: Self-pay | Admitting: Psychology

## 2015-06-11 DIAGNOSIS — R413 Other amnesia: Secondary | ICD-10-CM | POA: Diagnosis not present

## 2015-06-11 NOTE — Progress Notes (Signed)
[facility]  [address]   Telephone [phone] [address] Fax [phone] [place], [place] [address]  Initial Contact Note  Name:  Dakota Thompson Date of Birth; 1945-12-27 MRN:  [id number] Date:  06/11/2015  MORRY BAZIL is an 69 y.o. male who was referred for neuropsychological evaluation by Star Age, MD of [facility] due to his problems with memory.   A total of 5 hours was spent today reviewing medical records, interviewing (CPT [phone]) TRAYVEON HAGEMEIER and his wife, administering and scoring neurocognitive tests, and preparing a written report (CPT [phone] & [phone]).  Preliminary Diagnostic Impression: Memory difficulty [R41.3]  There were no concerns expressed or behaviors displayed by Jeanene Erb that would require immediate attention.   A full report will follow. His next appointment is scheduled for 06/21/15 to review the results and recomendations.   Jamey Ripa, Ph.D Licensed Psychologist 06/11/2015

## 2015-06-21 ENCOUNTER — Ambulatory Visit (INDEPENDENT_AMBULATORY_CARE_PROVIDER_SITE_OTHER): Payer: Medicare Other | Admitting: Psychology

## 2015-06-21 DIAGNOSIS — R413 Other amnesia: Secondary | ICD-10-CM

## 2015-06-21 NOTE — Progress Notes (Addendum)
[facility]  [address]   Telephone [phone] [address] Fax [phone] [place], [place] [address]   Bowman* This report should not be released without the consent of the client  Name:   Dakota Thompson  Date of Birth:  December 08, 2045 [facility] MR#:  [id number] Date of Evaluation: 06/11/15 & 06/21/15  Reason for Referral Dakota Thompson is a 69 year old right-handed man with an approximate two year history of short-term memory difficulties. A brain MRI on 03/08/15 showed mild cerebral atrophy but was otherwise unremarkable. He was referred for neuropsychological evaluation by Star Age MD of [facility].  Sources of Information Electronic medical records from the [facility] were reviewed. Dakota Thompson and his wife, Ms. Eloy End, were interviewed.   Chief Complaints Dakota Thompson reported being aware of gradually declining memory over the past two years. He gave examples of forgetting where he put his keys, not being able to hold more than a one-step command in short-term memory and struggling to multi-task. He has begun to leave himself notes on the counter to help remember things. He has been relying on his wife to keep track of his schedule. He did not report any problems with attention, communication, driving navigation or using household appliances.   His wife reported that he has shown gradually worsening memory problems over the past two years. She stated that he will forget an appointment despite having written himself a reminder note only a couple of hours before. She described him as easily irritated with her these days. He has been prone to   yell at her in anger but he has not been physically aggressive. She has not observed him to have exhibited confusion, mood instability, paranoia, problems with impulse control, loss of social comportment, unsafe behavior or problems with everyday  judgment.  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 He privately volunteered that he is "sorta depressed", which he attributed solely to feeling unhappy in his marriage. He described his wife of four years as "dominating" and "money hungry". He denied experiencing persisting low mood, apathy, undue anxiety, mood instability, suicidal or homicidal thoughts, hallucinations or delusional ideas.   Neither Dakota Thompson nor his wife were aware of any changes in his health status, medication usage or social situation coincident with the onset of his memory difficulties. He reported no subjective improvement in his memory since he began using a BiPap in Oct 07, 2013 to treat obstructive sleep apnea.   Background Dakota Thompson lives with his wife of four years. This is his second marriage. His first wife died in October 07, 2008 after a long illness. He has two adult children.  He retired in Oct 07, 2005. 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  He reported that he was graduated from high school as a below average student. He recalled being retained in grade at the age of [age] due to behavioral problems. He recollected having had problems maintaining attention in school. He stated that he was assigned to a remedial reading class in the eighth grade. He reported that he did not attend school most years between January and June so he could help his father on their farm.  His past medical  history was notable for obstructive sleep apnea (diagnosed via sleep studies in 2012 and 2015; wears BiPap though with difficulty tolerating), pancytopenia, peripheral neuropathy and restless leg syndrome. He reported an over twenty year history of hearing loss in both ears  that he attributed to occupational noise exposure.  He reported no history of head injury, loss of consciousness, stroke-like symptoms, neurological infections or exposure to neurotoxic chemicals. He reported a history of seizures at the age of [age] that ceased without medical intervention.   He reported no history of alcohol abuse or use of illicit drugs. He is a former cigarette smoker who quit in 1985.   He reported no history of psychiatric disorder or contacts with mental health professionals (other than recent marriage counseling).   His current medications include aspirin, celecoxib, gabapentin, olmesartan, omeprazole and pravastatin.   Observations He appeared as an appropriately groomed and dressed overweight man in no apparent physical distress. He interacted in a congenial and cooperative manner. He spoke in a normal tone of voice with a subtle lisp or perhaps a regional accent. He maintained good eye contact and responded to all questions. His affect appeared within a wide range and without signs of lability. He did not display signs of emotional distress. His thought processes were coherent and organized without signs of loose associations, verbal perseverations or flight of ideas. His thought content was devoid of unusual or bizarre ideas.  Evaluation Procedures In addition to review of medical records and interviews, the following tests or questionnaires were administered:  Health and safety inspector [facility] Naming Test Controlled Oral Word Association Test Modified [place]ta Card Sorting Test  Patient Health Questionnaire Rey Complex Figure: copy Trail Making A & B Wechsler Adult Intelligence Scale-IV:  Music therapist, Coding, Digit Span, Matrix Reasoning, Similarities & Vocabulary   Wechsler Memory Scale-IV Older Adult Battery  Assessment Results  Validity: It was concluded that the test results represented a valid measure of his cognitive functioning. He did not exhibit signs of  either physical or emotional discomfort. He described himself as feeling awake and mentally alert throughout testing. He did not report or display problems with vision (he wore his eyeglasses), hearing (he wore his hearing aids) or motor skills. He appeared to maintain attention and persist to task. He was judged to have expended maximal effort.   His test scores were corrected to reflect norms for his age and, whenever possible, his gender and educational level (i.e., 12 years). A listing of his test scores can be found at the end of this report.  Baseline Cognitive Level:  His baseline intellectual ability was estimated to fall within the Average range based on his performances on measures of word knowledge (Wechsler Adult Intelligence Scale-IV (WAIS-IV) Vocabulary) and nonverbal reasoning (WAIS-IV Matrix Reasoning) that are considered to be highly correlated with general intelligence.   Test Results & Interpretation:  His performances on the neuropsychological test battery were predominantly within normal expectations. He did demonstrate a relative weakness for abstract reasoning as evident by his Borderline to Low Average performances on tests of nonverbal concept formation (Modified [employer]) and verbal reasoning (WAIS-IV Similarities). He also performed within the Low Average range on a test of visual working memory span Producer, television/film/video Scale-IV (WMS-IV) Symbol Span). Otherwise, he performed within the Average range on measures of brief focused visual attention (WAIS-IV Coding; Trail Making A); complex attention that required mental tracking and set shifting (Trail Making B); auditory attentional capacity/working memory (WAIS-IV Digit Span); immediate and delayed  memory (WMS-IV); confrontation naming [employer]); word knowledge (WAIS-IV Vocabulary); phonemic fluency (Controlled Oral Word Association Test); categorical fluency (Animal Naming Test); and visuospatial  organization/construction Product manager;  Rey Complex Figure copy).   With regards to his emotional status, his score of 6 on the Patient Health Questionnaire (PHQ-9) fell within the mild range of depression. However, given that he endorsed sleep disturbance as his most frequent symptom over the past two weeks and did not endorse feeling sad or loss of interests/pleasure, it is likely that he is not clinically depressed.   Summary & Conclusions Dakota Thompson is a 69 year old man who reported an approximate two year history of gradually worsening short-term memory difficulties.   His performances on neuropsychological testing were predominantly within normal expectations. The majority of his scores on neurocognitive tests fell within the Average range, including measures of immediate and delayed memory. He did exhibit a relative weakness for abstract or logical reasoning. With regards to his emotional status, he did not report symptoms of depression though acknowledged feeling dissatisfied with his marital relationship. There was no report of behavioral dyscontrol.  In conclusion, neuropsychological evaluation would suggest a low likelihood of brain dysfunction. It is difficult to reconcile the degree of his subjective report of cognitive difficulty with the results of neurocognitive testing. It is likely that hearing loss has been disrupting his ability to absorb new information as he admitted that he often does not fully hear what is said to him. In addition, sleep apnea might be reducing his cognitive efficiency. Finally, it is possible that to some degree his forgetfulness around his wife is related to not paying attention to her associated with his dissatisfaction with their relationship.   Diagnostic Impression Memory loss [R41.3]  Recommendations 1. In light of his hearing loss, he was encouraged to ask for information to be repeated to him despite his report of feeling embarrassed to do  so.    2. He was advised to work with his physician to improve his ability to tolerate his BiPap mask.  3. A repeat neuropsychological evaluation could be considered in one to two years to track changes, if any, in his cognitive functioning, using the current results as a baseline.    I have appreciated the opportunity to evaluate Dakota Thompson. The conclusions and recommendations from this evaluation were discussed with him and his wife on 06/21/15. Please feel free to contact me with any comments or questions.     ______________________ Jamey Ripa, Ph.D Licensed Psychologist      . ADDENDUM-NEUROPSYCHOLOGICAL TEST RESULTS  Animal Naming Test Score= 15 32nd (adjusted for age, gender and educational level)    [place] Naming Test Score= [phone] 47th (adjusted for age, gender and educational level)   Controlled Oral Word Association Test Score= 41 words/1 repetitions 53rd (adjusted for age, gender and educational level)   Modified [place] Card Sorting Test  Categories correct    3   5th (adjusted for age, gender and education)  Perseverative errors   5 31st        Total errors  21 18th      Percent of perseverative errors 24 54th       Executive function composite 83 13th      Rey Complex Figure: copy       score= 33/36  Within normal limits   Trails A Score=  31s  0e 63rd (adjusted for age, gender and educational level)  Trails B Score= 100s 0e 45th (  adjusted for age, gender and educational level)   Wechsler Adult Intelligence Scale-IV  Subtest Scaled Score Percentile  Block Design   9 37th     Similarities   6  9th    Digit Span  Forward                Backward                Sequencing 10 10 10  10  50th            50th  50th    50th     Matrix Reasoning 11 63rd   Vocabulary 10 50th   Coding     8 25th      Wechsler Memory Scale-IV Older Adult battery Index Index Score Percentile  Immediate Memory   95 37th       Auditory Memory   98 45th          Visual Memory   89 23rd       Delayed Memory     52 32nd        Symbol Span subtest Scaled score= 7 16th

## 2015-06-28 ENCOUNTER — Encounter: Payer: Self-pay | Admitting: Psychology

## 2015-09-12 ENCOUNTER — Telehealth: Payer: Self-pay

## 2015-09-12 NOTE — Telephone Encounter (Signed)
I spoke to patient and asked him to bring in [place] card for tomorrow's visit.

## 2015-09-13 ENCOUNTER — Encounter: Payer: Self-pay | Admitting: Neurology

## 2015-09-13 ENCOUNTER — Ambulatory Visit (INDEPENDENT_AMBULATORY_CARE_PROVIDER_SITE_OTHER): Payer: Medicare Other | Admitting: Neurology

## 2015-09-13 VITALS — BP 128/75 | HR 71 | Resp 22 | Ht 69.0 in | Wt 257.0 lb

## 2015-09-13 DIAGNOSIS — E669 Obesity, unspecified: Secondary | ICD-10-CM

## 2015-09-13 DIAGNOSIS — R419 Unspecified symptoms and signs involving cognitive functions and awareness: Secondary | ICD-10-CM

## 2015-09-13 DIAGNOSIS — H9193 Unspecified hearing loss, bilateral: Secondary | ICD-10-CM | POA: Diagnosis not present

## 2015-09-13 DIAGNOSIS — G4733 Obstructive sleep apnea (adult) (pediatric): Secondary | ICD-10-CM | POA: Diagnosis not present

## 2015-09-13 NOTE — Progress Notes (Signed)
Subjective:    Patient ID: Dakota Thompson is a 70 y.o. male.  HPI     Interim history:   Dakota Thompson is a 70 year old right-handed gentleman with an underlying medical history of neuropathy, obesity and restless leg syndrome and mild cognitive impairment, who presents for follow-up consultation of his obstructive sleep apnea. The patient is unaccompanied today. I last saw him on 03/08/2015, at which time he reported being able to tolerate BiPAP a little bit better. Memory-wise, he felt a little worse. He had knee pain. I reviewed his brain MRI from 03/07/2014 through the PACS system today. I suggested we pursue formal neuropsychological evaluation. I referred him to Dr. Valentina Shaggy.   Today, 09/13/2015: I reviewed his BiPAP compliance data from 08/14/2015 through 09/12/2015 which is a total of 30 days during which time he used his machine every night with percent used days greater than 4 hours at 100%, indicating superb compliance with an average usage of 8 hours and 13 minutes, residual AHI 1.2 per hour, leak low, pressure at 12/8 cm.   Today, 09/13/2015: He reports doing okay with memory, no new issues, but still struggling with the FFM, as it is not very comfortable. He just received a new mask, but the frame is not as good. He has gained about 7 lb. He saw Dr. Valentina Shaggy on 06/11/2015 and in follow-up on 06/21/2015. I reviewed the office note and neurocognitive test results: Diagnostic Impression Memory loss [R41.3] Recommendations 1. In light of his hearing loss, he was encouraged to ask for information to be repeated to him despite his report of feeling embarrassed to do so.     2. He was advised to work with his physician to improve his ability to tolerate his BiPap mask.    3. A repeat neuropsychological evaluation could be considered in one to two years to track changes, if any, in his cognitive functioning, using the current results as a baseline.  Previously:   I saw him on 09/15/2014, at  which time he reported R cataract surgery on 08/03/14 and L cataract repair on 08/31/14 with good results. His wife was worried about his lack of mobility. He was not exercising. He was reporting knee pain. He was not drinking enough water. He was struggling with BiPAP treatment. His wife had noted less twitching of his legs and less snoring and he seemed to sleep more consolidated. As far as his memory, his long-term memory much improved either. He felt he had issues with OCD. He could not tolerate the chin strap.    I reviewed his BiPAP compliance data from 02/06/2015 through 03/07/2015 which is a total of 30 days during which time he used his machine every night with percent used days greater than 4 hours at 100%, indicating superb compliance with an average usage of 7 hours and 36 minutes, pressure of 12/8, residual AHI 1.3, leaked low.   He had a brain MRI wo contrast on 03/08/15: Mild cerebral atrophy, otherwise unremarkable appearance of the brain.    I first met him on 12/30/2013 at the request of Dr. Janann Colonel, at which time we talked about his recent sleep test results including his baseline sleep study from March 2015 as well as a CPAP titration study from April 2015 and we talked about his compliance data on how he felt. He reported sleeping better and he was twitching less during his sleep. In the interim, he was seen by Dr. 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 His  MOCA score was 27 at the time.  I reviewed his BiPAP compliance data from 03/24/14 to 06/21/14, which is a total of 90 days, during which time he used his machine every night, with percent used days greater than 4 hours of 91.1%, indicating excellent compliance, with an average usage of 6 hours and 28 minutes, pressure at 12/8, residual AHI at 1.5 per hour, leak low.  I reviewed his BiPAP compliance data from 08/16/2014 through 09/14/2014 which is a total of 30 days during which time he used his machine every night with percent  used days greater than 4 hours at 96.7%, indicating excellent compliance. Pressure setting the same, residual AHI low at 1.7 per hour, leak low. Average usage of 6 hours and 37 minutes for all nights.   His baseline sleep study from 09/06/2013 showed a sleep efficiency was reduced at 66.6% with a latency to sleep of 13.5 minutes and wake after sleep onset of 131 minutes with moderate sleep fragmentation noted. He had an elevated arousal index. He had an increased percentage of stage II sleep, 5.6% of slow-wave sleep, and 19.1% of REM sleep with a significantly reduced REM latency of 2 minutes. He had mild periodic leg movements at 17.9 per hour resulting in 4 arousals per hour. He had mild to moderate snoring. He did not achieve much in the way of supine sleep reporting that he's not able to sleep on his back. He had one central apnea and 25 obstructive hypopneas with a total AHI of 5.4 per hour, rising to 9.8 per hour in REM sleep. Baseline oxygen saturation was only 90%, nadir was 84%. Time below 88% saturation was 8 minutes and 18 seconds. He was then requested to come back for a CPAP titration study to help his sleep disordered breathing and in light of his significant desaturations. He had a CPAP titration study on 10/06/2013 which showed a sleep efficiency of 65.4% with a prolonged sleep latency of 59.5 minutes and wake after sleep onset of 106.5 minutes with moderate sleep fragmentation noted. He had an elevated arousal index at 13.4 per hour primarily because of spontaneous arousals. He had an increased percentage of light stage sleep, 1.6% of slow-wave sleep, and a mildly decreased percentage of REM sleep at 17.5% with a prolonged REM latency. He had mild PLMS a 10.7 per hour with an associated arousal index of 1.7 per hour. Baseline oxygen saturation was 91%, nadir was 88%. He was started on CPAP at 5 cm. However he was not able to tolerate this and felt panicked. He did not tolerate the nasal pillows  mask or nasal mask and did somewhat better with a full facemask. He was switched to BiPAP for better tolerance of the pressures were titrated from 8/4 cm to 12/8 cm. His oxygen saturation appeared to be better on the final setting of 12/8 cm. REM sleep was achieved on the final pressure but very little supine sleep was achieved during the study. His AHI was 0 on the final pressure. Based on the test results I prescribed BiPAP for him.    I reviewed the patient's PAP compliance data from 11/02/13 to 12/01/2013, which is a total of 30 days, during which time the patient used BiPAP.  The average usage for all days was 6 hours and 29 minutes. The percent used days greater than 4 hours was 100 %, indicating superb compliance. The residual AHI was 2.1 per hour, indicating an adequate treatment pressure of 12/8 cwp with low leak  reported.  His typical bedtime is reported to be around 11 PM. He reports that he does not really like using his BiPAP but he is getting used to it. Provides report he seems to sleep more soundly and is less restless in his sleep. He has not noted any improvement in his cognitive function yet. He is wondering if he will have to use this machine longer term and whether he has to keep using it.    His Past Medical History Is Significant For: Past Medical History  Diagnosis Date  . Hypertension   . Pancytopenia     mild  . 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 Restless leg syndrome   . Viral syndrome     Hx  . Pancytopenia 02/27/2011  . Viral syndrome 02/27/2011  . Thyroid disease     "normal now"  . Hx of adenomatous colonic polyps     tubulovillous adenoma  . Sleep apnea     wears CPAP/BIPAP every night    Her Past Surgical History Is Significant For: Past Surgical History  Procedure Laterality Date  . Ganglion cyst excision    . Nasal sinus surgery    . Knee cartilage repair  01/31/11  . Colonoscopy  01/02/2010    RMR: normal  rectum and colon. marginal prep compromised exam.   . Colonoscopy N/A 03/15/2015    Procedure: COLONOSCOPY;  Surgeon: Daneil Dolin, MD;  Location: [facility];  Service: Endoscopy;  Laterality: N/A;  1215 - moved to 12:45 - office to notify  . Esophagogastroduodenoscopy N/A 03/15/2015    Procedure: ESOPHAGOGASTRODUODENOSCOPY (EGD);  Surgeon: Daneil Dolin, MD;  Location: [facility];  Service: Endoscopy;  Laterality: N/A;    His Family History Is Significant For: Family History  Problem Relation Age of Onset  . Colon cancer Neg Hx   . Cancer Brother     unknown type  . Cancer Sister     unknown type    His Social History Is Significant For: Social History   Social History  . Marital Status: Married    Spouse Name: Adela Lank   . Number of Children: 2  . Years of Education: N/A   Occupational History  . Retired    Social History Main Topics  . Smoking status: Former Smoker    Types: Cigarettes    Quit date: 05/22/1981  . Smokeless tobacco: Never Used  . Alcohol Use: No  . Drug Use: No  . Sexual Activity: Not Asked   Other Topics Concern  . None   Social History Narrative   Patient lives at home with his wife.    Patient has 2 children.    Patient is retired.     His Allergies Are:  No Known Allergies:   His Current Medications Are:  Outpatient Encounter Prescriptions as of 09/13/2015  Medication Sig  . aspirin 81 MG tablet Take 81 mg by mouth daily.    . celecoxib (CELEBREX) 100 MG capsule Take 100 mg by mouth 2 (two) times daily.   Mariane Baumgarten Calcium (STOOL SOFTENER PO) Take by mouth 2 (two) times daily. Takes 2 in the morning.  . gabapentin (NEURONTIN) 300 MG capsule Take 600 mg by mouth at bedtime. Take 900 mg as needed  . Multiple Vitamins-Minerals (CENTRUM SILVER PO) Take by mouth.    . olmesartan (BENICAR) 40 MG tablet Take 40 mg by mouth daily.  . Omega-3 Fatty Acids (FISH OIL)  1000 MG CAPS Take 1,200 mg by mouth 3 (three) times daily. Take 2 capsules  by mouth in the morning and 1 in the evening.  Marland Kitchen omeprazole (PRILOSEC) 20 MG capsule Take 20 mg by mouth 2 (two) times daily.    . pravastatin (PRAVACHOL) 20 MG tablet Take 20 mg by mouth daily.  . [DISCONTINUED] olmesartan (BENICAR) 40 MG tablet Take 40 mg by mouth daily.    . [DISCONTINUED] pravastatin (PRAVACHOL) 10 MG tablet Take 10 mg by mouth at bedtime.   No facility-administered encounter medications on file as of 09/13/2015.  :  Review of Systems:  Out of a complete 14 point review of systems, all are reviewed and negative with the exception of these symptoms as listed below:   Review of Systems  Neurological:       Patient is here for f/u. No new concerns.     Objective:  Neurologic Exam  Physical Exam Physical Examination:   Filed Vitals:   09/13/15 1607  BP: 128/75  Pulse: 71  Resp: 22    General Examination: The patient is a very pleasant 70 y.o. male in no acute distress. He appears well-developed and well-nourished and well groomed. He is in good spirits today.  HEENT: Normocephalic, atraumatic, pupils are equal, round and reactive to light and accommodation. Extraocular tracking is good without limitation to gaze excursion or nystagmus noted. Normal smooth pursuit is noted. He is hard of hearing. He has bilateral hearing aids in place. Face is symmetric with normal facial animation and normal facial sensation. Speech is clear with no dysarthria noted. There is no hypophonia. There is no lip, neck/head, jaw or voice tremor. Neck is supple with full range of passive and active motion. There are no carotid bruits on auscultation. Oropharynx exam reveals: mild mouth dryness, adequate dental hygiene and moderate airway crowding.   Chest: Clear to auscultation without wheezing, rhonchi or crackles noted.  Heart: S1+S2+0, regular and normal without murmurs, rubs or gallops noted.   Abdomen: Soft, non-tender and non-distended with normal bowel sounds appreciated on  auscultation.  Extremities: There is no pitting edema in the distal lower extremities bilaterally. Pedal pulses are intact.  Skin: Warm and dry without trophic changes noted. There are no varicose veins.  Musculoskeletal: exam reveals no obvious joint deformities, tenderness or joint swelling or erythema. He reports bilateral knee pain, right more than left.  Neurologically:  Mental status: The patient is awake, alert and oriented in all 4 spheres. His immediate and remote memory, attention, language skills and fund of knowledge are fairly appropriate. There is no evidence of aphasia, agnosia, apraxia or anomia. Speech is clear with normal prosody and enunciation. Thought process is linear. Mood is normal and affect is normal.   On 03/08/2015: [place] 26/30.  Cranial nerves II - XII are as described above under HEENT exam. In addition: shoulder shrug is normal with equal shoulder height noted. Motor exam: Normal bulk, strength and tone is noted. There is no drift, tremor or rebound. Romberg is negative. Reflexes are 2+ throughout with the exception of absent ankle jerks bilaterally. Fine motor skills and coordination: intact with normal finger taps, normal hand movements, normal rapid alternating patting, normal foot taps and normal foot agility.  Cerebellar testing: No dysmetria or intention tremor on finger to nose testing. Heel to shin is unremarkable bilaterally. There is no truncal or gait ataxia.  Sensory exam: intact to light touch, pinprick, vibration, temperature sense in the upper and evidence of decrease in  pinprick sensation and vibration sense in the distal lower extremities, unchanged.  Gait, station and balance: He stands easily. No veering to one side is noted. No leaning to one side is noted. Posture is age-appropriate and stance is narrow based. Gait shows normal stride length and normal pace. No problems turning are noted. He turns en bloc.  Heel to shin is somewhat challenging for  him, so is tandem walk.              Assessment and Plan:   In summary, MCCAULEY DIEHL is a very pleasant 70 year old male with an underlying medical history of neuropathy, hearing loss, cogniti;ve complaints, obesity and restless leg syndrome, who presents for followup consultation of his obstructive sleep apnea, treated with BiPAP therapy. He has been very good with his compliance, could not tolerate CPAP but has done well with BiPAP at 12/8 cm with full compliance. Memory complaints have been stable. He had neuropsychological testing with Dr. Valentina Shaggy in December 2016 and I explained his test results to him. Findings were reassuring, his physical exam is stable with the exception of mild weight gain. We will try to get him perhaps a different type of full face mask as he is a mouth breather and may need to use a fullface mask but a different mask brand may be more tolerable for him. He just had a new mask and for his next mask renewal may be able to try a different brand. I placed an order for this. He is encouraged to work on weight loss, and encouraged to be fully compliant with BiPAP therapy. He is commended for his treatment adherence which is superb at this time. I would like to see him back in about a year, we we will talk about his memory at the time and repeat memory scores and we will also consider repeat neuropsychological testing down the road in about a year or 2.  I answered all his questions today and the patient was in agreement. I spent 20 minutes in total face-to-face time with the patient, more than 50% of which was spent in counseling and coordination of care, reviewing test results, reviewing medication and discussing or reviewing the diagnosis of OSA and memory loss, the prognosis and treatment options.

## 2015-09-13 NOTE — Patient Instructions (Addendum)
Please continue using your BiPAP regularly. While your insurance requires that you use BiPAP at least 4 hours each night on 70% of the nights, I recommend, that you not skip any nights and use it throughout the night if you can. Getting used to BiPAP and staying with the treatment long term does take time and patience and discipline. Untreated obstructive sleep apnea when it is moderate to severe can have an adverse impact on cardiovascular health and raise her risk for heart disease, arrhythmias, hypertension, congestive heart failure, stroke and diabetes. Untreated obstructive sleep apnea causes sleep disruption, nonrestorative sleep, and sleep deprivation. This can have an impact on your day to day functioning and cause daytime sleepiness and impairment of cognitive function, memory loss, mood disturbance, and problems focussing. Using BiPAP regularly can improve these symptoms.  We will try to get you a different full face mask.   Keep up the good work! I will see you back in 12 months.

## 2016-07-14 ENCOUNTER — Telehealth: Payer: Self-pay

## 2016-07-14 NOTE — Telephone Encounter (Signed)
Agree. Next TCS due 02/2020.

## 2016-07-14 NOTE — Telephone Encounter (Signed)
Pt called and said he thought Dr. Gerarda Fraction sent his referral for the colonoscopy too soon.  He said he thought he had colonoscopy recently.  According to our records pt's last colonoscopy was 03/15/2015 and he is due his next one in 2021.  Ptr said he is not having any problems now and he is aware he is on recall and will call if he has problems before then.   I am forwarding a note to Dr. Gerarda Fraction for his records.

## 2016-08-14 IMAGING — MR MR HEAD W/O CM
6 of 10 series · 22 of 48 positions shown · non-contrast
Comparison: Head CT 07/06/2007

CLINICAL DATA: Cognitive decline.

EXAM:
MRI HEAD WITHOUT CONTRAST
TECHNIQUE: Multiplanar, multiecho pulse sequences of the brain and surrounding
structures were obtained without intravenous contrast.

[Series 3: T1 · sagittal · 5.0mm · 0.43mm/px · 3 of 21 slices shown (1 of 2)]
[im 1/21]
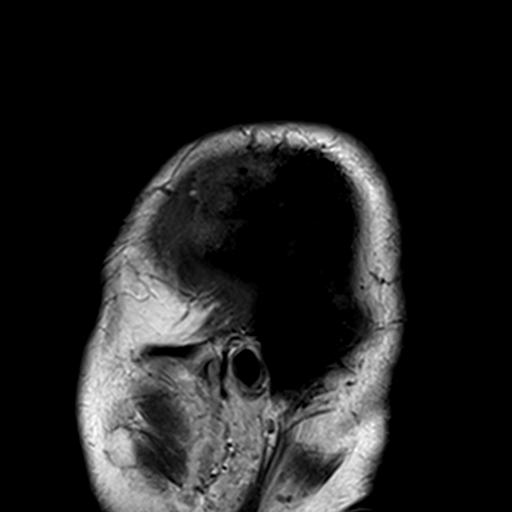
[im 11/21]
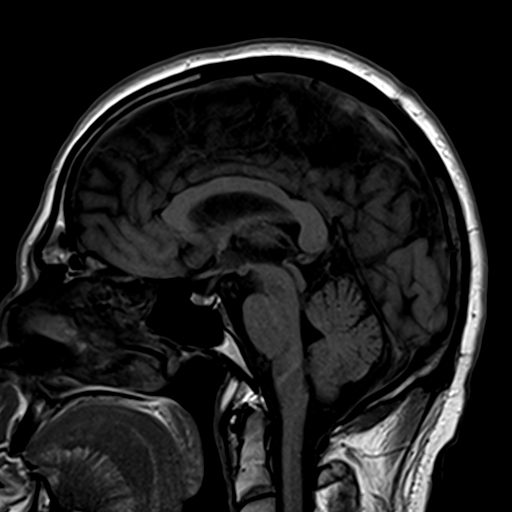
[im 21/21]
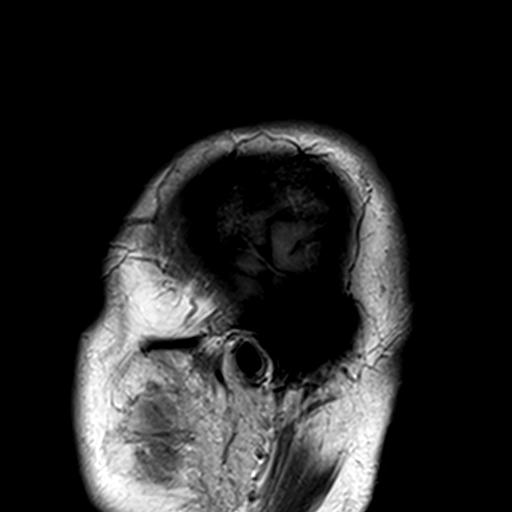

[Series 6: T2 · axial · 5.0mm · 0.51mm/px · z∈[-101,+54]mm · 3 of 25 slices shown (1 of 2)]
[im 1/25]
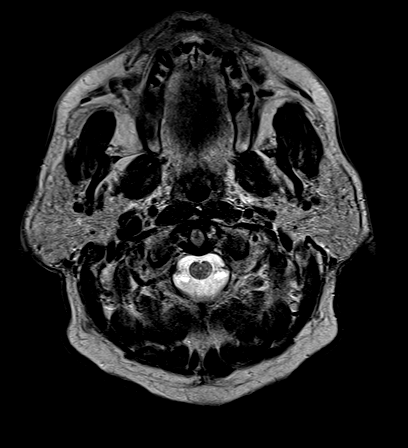
[im 13/25]
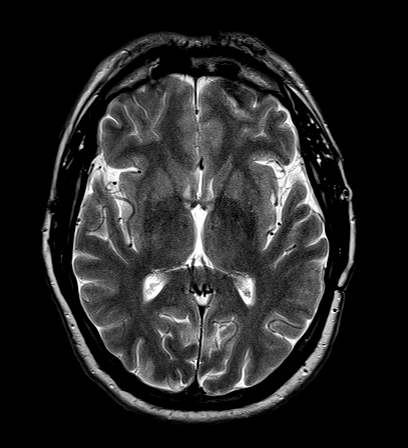
[im 25/25]
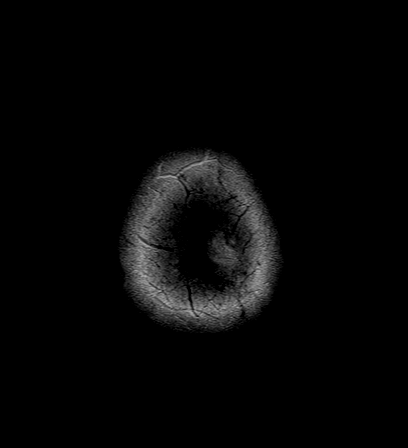

[Series 7: FLAIR · axial · 5.0mm · 0.35mm/px · z∈[-102,+52]mm · 3 of 25 slices shown]
[im 1/25]
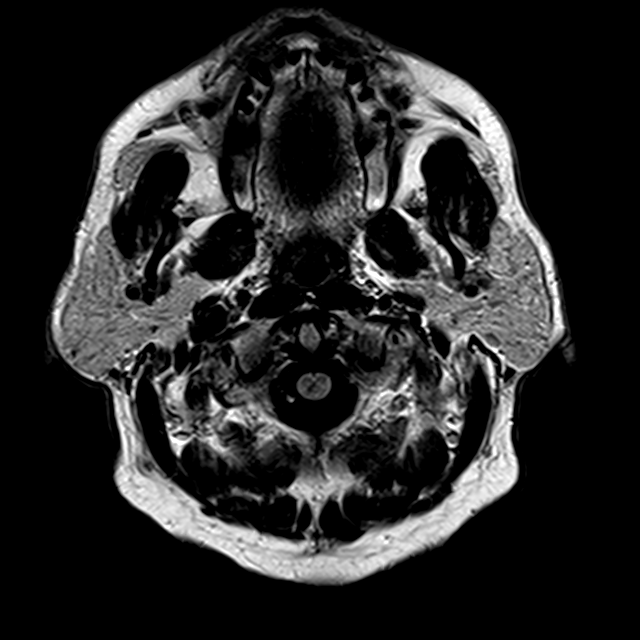
[im 13/25]
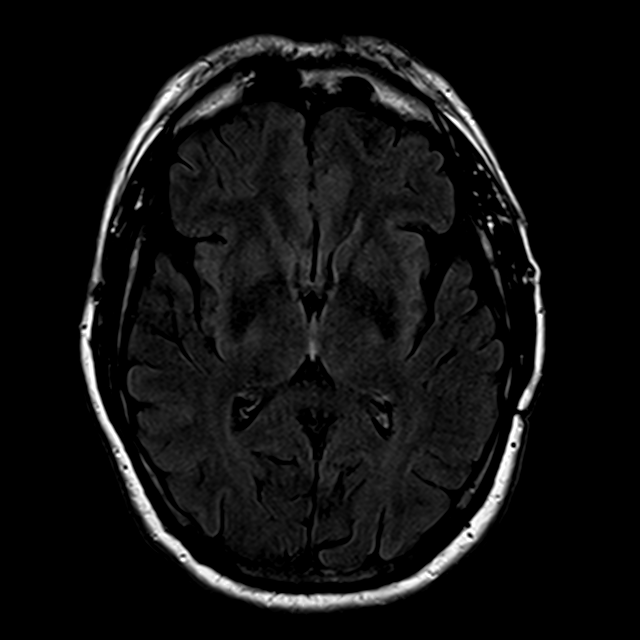
[im 25/25]
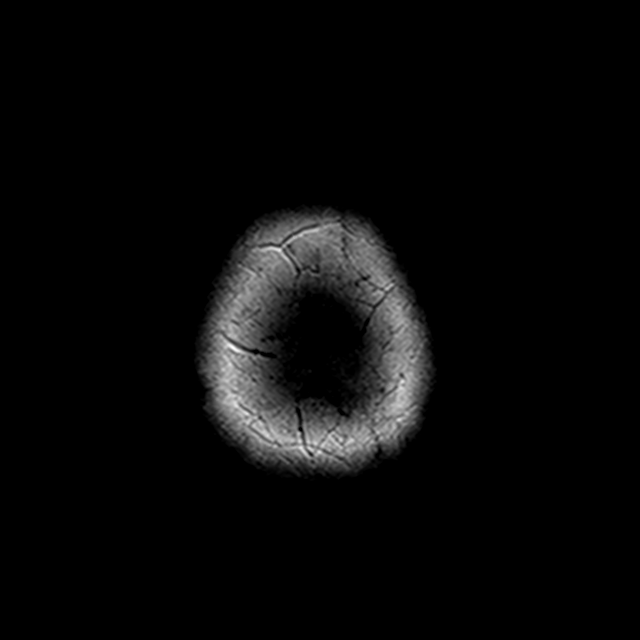

[Series 8: T1 · axial · 2.0mm · 0.42mm/px · z∈[-109,+58]mm · 8 of 85 slices shown (2 of 2)]
[im 1/85]
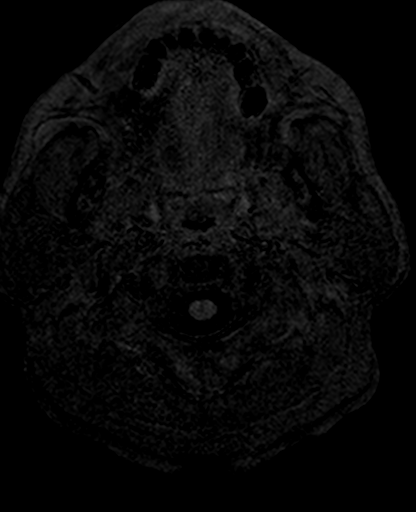
[im 16/85]
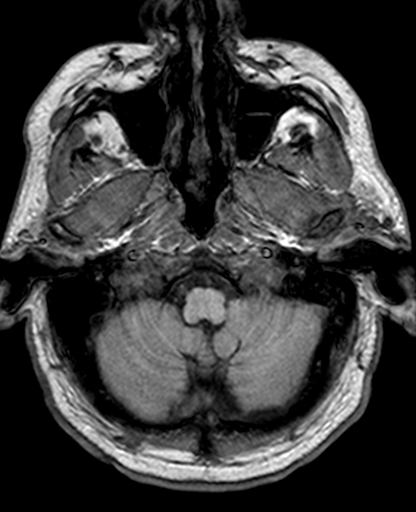
[im 23/85]
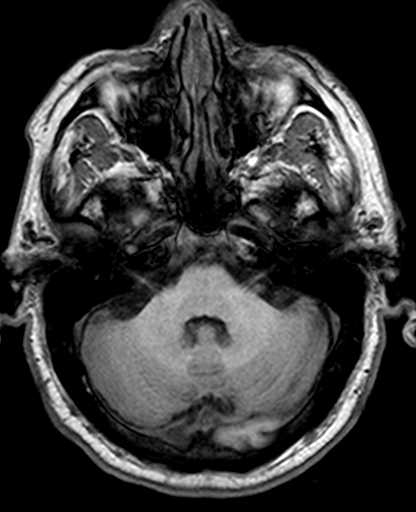
[im 39/85]
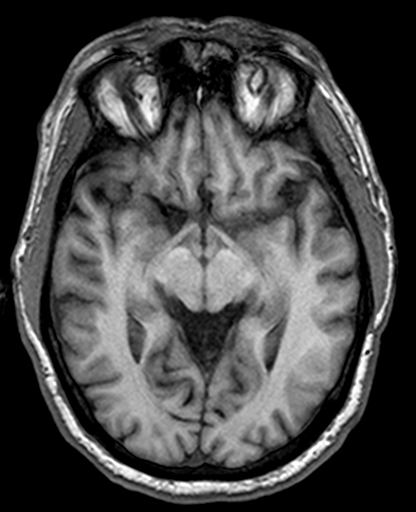
[im 46/85]
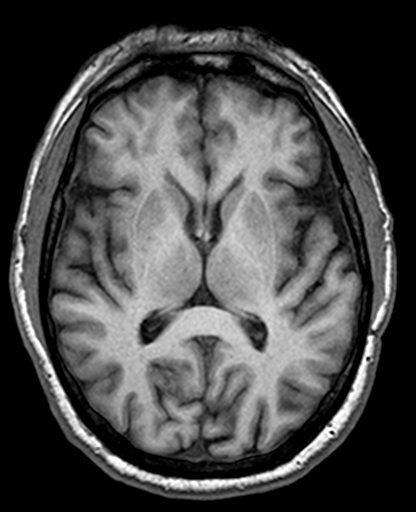
[im 62/85]
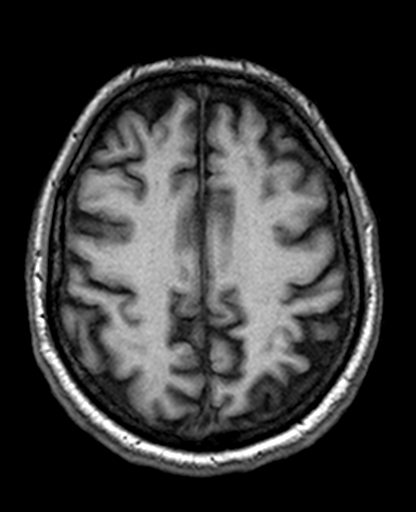
[im 69/85]
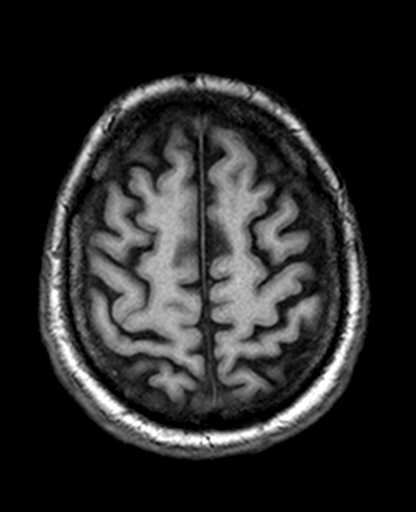
[im 85/85]
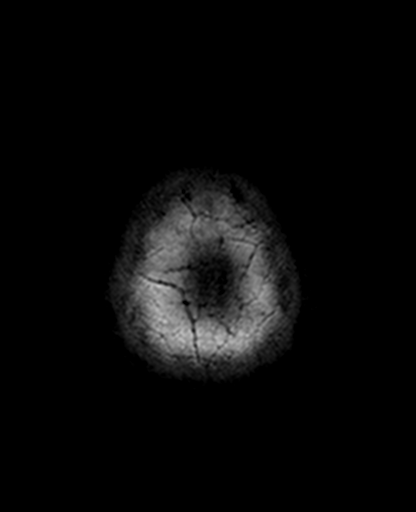

[Series 9: trauma axial · axial · 5.0mm · 0.41mm/px · 1 of 25 slices shown]
[im 1/25]
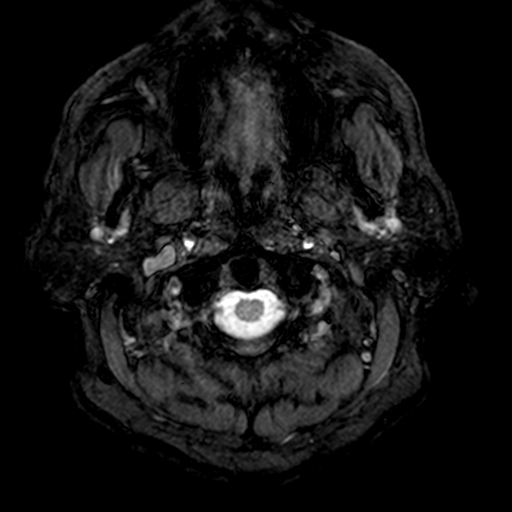

[Series 10: T2 · coronal · 5.0mm · 0.46mm/px · 4 of 27 slices shown (2 of 2)]
[im 1/27]
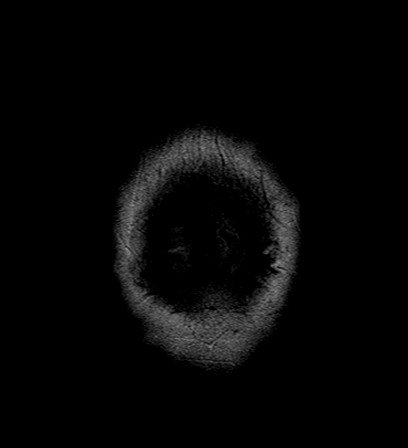
[im 9/27]
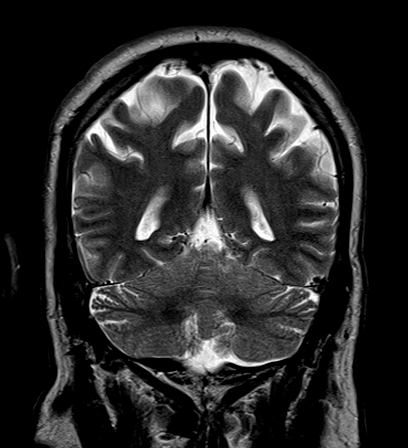
[im 18/27]
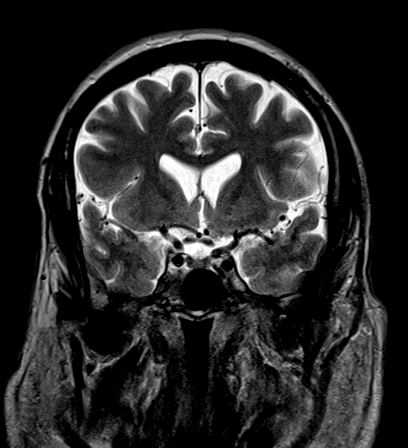
[im 27/27]
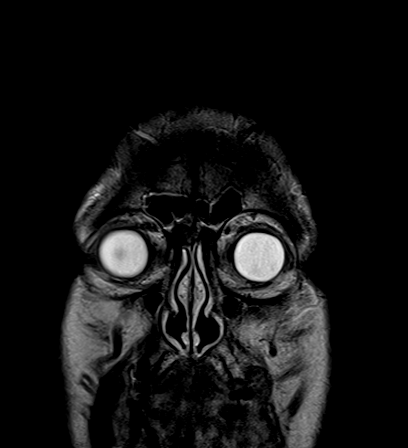

[22 of 48 positions shown; findings below may reference images not displayed]

FINDINGS: There is no evidence of acute infarct, intracranial hemorrhage,
mass, midline shift, or extra-axial fluid collection. There is mild
generalized cerebral atrophy. No significant white matter disease is
seen.

Orbits are unremarkable. Minimal bilateral ethmoid air cell mucosal
thickening and a small left mastoid effusion are present. Major
intracranial vascular flow voids are preserved.
IMPRESSION: Mild cerebral atrophy, otherwise unremarkable appearance of the
brain.

## 2016-08-25 ENCOUNTER — Ambulatory Visit: Payer: Self-pay | Admitting: Orthopedic Surgery

## 2016-09-02 ENCOUNTER — Encounter (HOSPITAL_COMMUNITY): Payer: Self-pay | Admitting: *Deleted

## 2016-09-16 NOTE — Patient Instructions (Addendum)
Dakota Thompson  09/16/2016   Your procedure is scheduled on: 10/03/16  Report to [facility] Main  Entrance take [facility]  elevators to 3rd floor to  [place] at     12:30PM.  Call this number if you have problems the morning of surgery [phone]   Remember: ONLY 1 PERSON MAY GO WITH YOU TO SHORT STAY TO GET  READY MORNING OF [place].   Do not eat food:After Midnight. YOU MAY HAVE CLEAR LIQUIDS UNTIL 0830 am then NOTHING BY MOUTH     Take these medicines the morning of surgery with A SIP OF WATER: Gemfibrozil, prilosec                                 You may not have any metal on your body including hair pins and              piercings  Do not wear jewelry,  lotions, powders or perfumes, deodorant                       Men may shave face and neck.   Do not bring valuables to the hospital. [place].  Contacts, dentures or bridgework may not be worn into surgery.  Leave suitcase in the car. After surgery it may be brought to your room.                  Please read over the following fact sheets you were given: _____________________________________________________________________             [facility] - Preparing for Surgery Before surgery, you can play an important role.  Because skin is not sterile, your skin needs to be as free of germs as possible.  You can reduce the number of germs on your skin by washing with CHG (chlorahexidine gluconate) soap before surgery.  CHG is an antiseptic cleaner which kills germs and bonds with the skin to continue killing germs even after washing. Please DO NOT use if you have an allergy to CHG or antibacterial soaps.  If your skin becomes reddened/irritated stop using the CHG and inform your nurse when you arrive at Short Stay. Do not shave (including legs and underarms) for at least 48 hours prior to the first CHG shower.  You may shave your face/neck. Please  follow these instructions carefully:  1.  Shower with CHG Soap the night before surgery and the  morning of Surgery.  2.  If you choose to wash your hair, wash your hair first as usual with your  normal  shampoo.  3.  After you shampoo, rinse your hair and body thoroughly to remove the  shampoo.                           4.  Use CHG as you would any other liquid soap.  You can apply chg directly  to the skin and wash                       Gently with a scrungie or clean washcloth.  5.  Apply the CHG Soap to your body ONLY  FROM THE NECK DOWN.   Do not use on face/ open                           Wound or open sores. Avoid contact with eyes, ears mouth and genitals (private parts).                       Wash face,  Genitals (private parts) with your normal soap.             6.  Wash thoroughly, paying special attention to the area where your surgery  will be performed.  7.  Thoroughly rinse your body with warm water from the neck down.  8.  DO NOT shower/wash with your normal soap after using and rinsing off  the CHG Soap.                9.  Pat yourself dry with a clean towel.            10.  Wear clean pajamas.            11.  Place clean sheets on your bed the night of your first shower and do not  sleep with pets. Day of Surgery : Do not apply any lotions/deodorants the morning of surgery.  Please wear clean clothes to the hospital/surgery center.  FAILURE TO FOLLOW THESE INSTRUCTIONS MAY RESULT IN THE CANCELLATION OF YOUR SURGERY PATIENT SIGNATURE_________________________________  NURSE SIGNATURE__________________________________  ________________________________________________________________________  WHAT IS A BLOOD TRANSFUSION? Blood Transfusion Information  A transfusion is the replacement of blood or some of its parts. Blood is made up of multiple cells which provide different functions.  Red blood cells carry oxygen and are used for blood loss replacement.  White blood cells  fight against infection.  Platelets control bleeding.  Plasma helps clot blood.  Other blood products are available for specialized needs, such as hemophilia or other clotting disorders. BEFORE THE TRANSFUSION  Who gives blood for transfusions?   Healthy volunteers who are fully evaluated to make sure their blood is safe. This is blood bank blood. Transfusion therapy is the safest it has ever been in the practice of medicine. Before blood is taken from a donor, a complete history is taken to make sure that person has no history of diseases nor engages in risky social behavior (examples are intravenous drug use or sexual activity with multiple partners). The donor's travel history is screened to minimize risk of transmitting infections, such as malaria. The donated blood is tested for signs of infectious diseases, such as HIV and hepatitis. The blood is then tested to be sure it is compatible with you in order to minimize the chance of a transfusion reaction. If you or a relative donates blood, this is often done in anticipation of surgery and is not appropriate for emergency situations. It takes many days to process the donated blood. RISKS AND COMPLICATIONS Although transfusion therapy is very safe and saves many lives, the main dangers of transfusion include:   Getting an infectious disease.  Developing a transfusion reaction. This is an allergic reaction to something in the blood you were given. Every precaution is taken to prevent this. The decision to have a blood transfusion has been considered carefully by your caregiver before blood is given. Blood is not given unless the benefits outweigh the risks. AFTER THE TRANSFUSION  Right after receiving a blood transfusion, you will usually feel much better  and more energetic. This is especially true if your red blood cells have gotten low (anemic). The transfusion raises the level of the red blood cells which carry oxygen, and this usually  causes an energy increase.  The nurse administering the transfusion will monitor you carefully for complications. HOME CARE INSTRUCTIONS  No special instructions are needed after a transfusion. You may find your energy is better. Speak with your caregiver about any limitations on activity for underlying diseases you may have. SEEK MEDICAL CARE IF:   Your condition is not improving after your transfusion.  You develop redness or irritation at the intravenous (IV) site. SEEK IMMEDIATE MEDICAL CARE IF:  Any of the following symptoms occur over the next 12 hours:  Shaking chills.  You have a temperature by mouth above 102 F (38.9 C), not controlled by medicine.  Chest, back, or muscle pain.  People around you feel you are not acting correctly or are confused.  Shortness of breath or difficulty breathing.  Dizziness and fainting.  You get a rash or develop hives.  You have a decrease in urine output.  Your urine turns a dark color or changes to pink, red, or brown. Any of the following symptoms occur over the next 10 days:  You have a temperature by mouth above 102 F (38.9 C), not controlled by medicine.  Shortness of breath.  Weakness after normal activity.  The white part of the eye turns yellow (jaundice).  You have a decrease in the amount of urine or are urinating less often.  Your urine turns a dark color or changes to pink, red, or brown. Document Released: 06/06/2000 Document Revised: 09/01/2011 Document Reviewed: 01/24/2008 ExitCare Patient Information 2014 [place].  _______________________________________________________________________  Incentive Spirometer  An incentive spirometer is a tool that can help keep your lungs clear and active. This tool measures how well you are filling your lungs with each breath. Taking long deep breaths may help reverse or decrease the chance of developing breathing (pulmonary) problems (especially infection)  following:  A long period of time when you are unable to move or be active. BEFORE THE PROCEDURE   If the spirometer includes an indicator to show your best effort, your nurse or respiratory therapist will set it to a desired goal.  If possible, sit up straight or lean slightly forward. Try not to slouch.  Hold the incentive spirometer in an upright position. INSTRUCTIONS FOR USE  1. Sit on the edge of your bed if possible, or sit up as far as you can in bed or on a chair. 2. Hold the incentive spirometer in an upright position. 3. Breathe out normally. 4. Place the mouthpiece in your mouth and seal your lips tightly around it. 5. Breathe in slowly and as deeply as possible, raising the piston or the ball toward the top of the column. 6. Hold your breath for 3-5 seconds or for as long as possible. Allow the piston or ball to fall to the bottom of the column. 7. Remove the mouthpiece from your mouth and breathe out normally. 8. Rest for a few seconds and repeat Steps 1 through 7 at least 10 times every 1-2 hours when you are awake. Take your time and take a few normal breaths between deep breaths. 9. The spirometer may include an indicator to show your best effort. Use the indicator as a goal to work toward during each repetition. 10. After each set of 10 deep breaths, practice coughing to be sure your  lungs are clear. If you have an incision (the cut made at the time of surgery), support your incision when coughing by placing a pillow or rolled up towels firmly against it. Once you are able to get out of bed, walk around indoors and cough well. You may stop using the incentive spirometer when instructed by your caregiver.  RISKS AND COMPLICATIONS  Take your time so you do not get dizzy or light-headed.  If you are in pain, you may need to take or ask for pain medication before doing incentive spirometry. It is harder to take a deep breath if you are having pain. AFTER USE  Rest and  breathe slowly and easily.  It can be helpful to keep track of a log of your progress. Your caregiver can provide you with a simple table to help with this. If you are using the spirometer at home, follow these instructions: [place] IF:   You are having difficultly using the spirometer.  You have trouble using the spirometer as often as instructed.  Your pain medication is not giving enough relief while using the spirometer.  You develop fever of 100.5 F (38.1 C) or higher. SEEK IMMEDIATE MEDICAL CARE IF:   You cough up bloody sputum that had not been present before.  You develop fever of 102 F (38.9 C) or greater.  You develop worsening pain at or near the incision site. MAKE SURE YOU:   Understand these instructions.  Will watch your condition.  Will get help right away if you are not doing well or get worse. Document Released: 10/20/2006 Document Revised: 09/01/2011 Document Reviewed: 12/21/2006 ExitCare Patient Information 2014 [place].   ________________________________________________________________________    CLEAR LIQUID DIET   Foods Allowed                                                                     Foods Excluded  Coffee and tea, regular and decaf                             liquids that you cannot  Plain Jell-O in any flavor                                             see through such as: Fruit ices (not with fruit pulp)                                     milk, soups, orange juice  Iced Popsicles                                    All solid food Carbonated beverages, regular and diet                                    Cranberry, grape and apple juices Sports drinks like Gatorade Lightly  seasoned clear broth or consume(fat free) Sugar, honey syrup  Sample Menu Breakfast                                Lunch                                     Supper Cranberry juice                    Beef broth                             Chicken broth Jell-O                                     Grape juice                           Apple juice Coffee or tea                        Jell-O                                      Popsicle                                                Coffee or tea                        Coffee or tea  _____________________________________________________________________

## 2016-09-16 NOTE — Progress Notes (Signed)
Clearance from Dr. Gerarda Fraction 07/31/16 on chart and 08/29/16 on chart

## 2016-09-17 ENCOUNTER — Encounter: Payer: Self-pay | Admitting: Neurology

## 2016-09-17 ENCOUNTER — Encounter (HOSPITAL_COMMUNITY): Payer: Self-pay

## 2016-09-17 ENCOUNTER — Encounter (HOSPITAL_COMMUNITY)
Admission: RE | Admit: 2016-09-17 | Discharge: 2016-09-17 | Disposition: A | Discharge status: Home / Self Care | Payer: Medicare Other | Source: Ambulatory Visit | Attending: Specialist | Admitting: Specialist

## 2016-09-17 ENCOUNTER — Encounter (INDEPENDENT_AMBULATORY_CARE_PROVIDER_SITE_OTHER): Payer: Self-pay

## 2016-09-17 DIAGNOSIS — M1711 Unilateral primary osteoarthritis, right knee: Secondary | ICD-10-CM | POA: Diagnosis not present

## 2016-09-17 DIAGNOSIS — Z01810 Encounter for preprocedural cardiovascular examination: Secondary | ICD-10-CM | POA: Diagnosis not present

## 2016-09-17 DIAGNOSIS — Z01818 Encounter for other preprocedural examination: Secondary | ICD-10-CM | POA: Diagnosis present

## 2016-09-17 HISTORY — DX: Gastro-esophageal reflux disease without esophagitis: K21.9

## 2016-09-17 HISTORY — DX: Anxiety disorder, unspecified: F41.9

## 2016-09-17 LAB — CBC
HCT: 41.1 % (ref 39.0–52.0)
HEMOGLOBIN: 14.3 g/dL (ref 13.0–17.0)
MCH: 29.4 pg (ref 26.0–34.0)
MCHC: 34.8 g/dL (ref 30.0–36.0)
MCV: 84.4 fL (ref 78.0–100.0)
Platelets: 202 10*3/uL (ref 150–400)
RBC: 4.87 MIL/uL (ref 4.22–5.81)
RDW: 14.6 % (ref 11.5–15.5)
WBC: 4.3 10*3/uL (ref 4.0–10.5)

## 2016-09-17 LAB — URINALYSIS, ROUTINE W REFLEX MICROSCOPIC
Bilirubin Urine: NEGATIVE
GLUCOSE, UA: NEGATIVE mg/dL
Hgb urine dipstick: NEGATIVE
Ketones, ur: NEGATIVE mg/dL
LEUKOCYTES UA: NEGATIVE
NITRITE: NEGATIVE
PROTEIN: NEGATIVE mg/dL
Specific Gravity, Urine: 1.019 (ref 1.005–1.030)
pH: 5 (ref 5.0–8.0)

## 2016-09-17 LAB — BASIC METABOLIC PANEL
ANION GAP: 8 (ref 5–15)
BUN: 25 mg/dL — ABNORMAL HIGH (ref 6–20)
CO2: 27 mmol/L (ref 22–32)
Calcium: 9.6 mg/dL (ref 8.9–10.3)
Chloride: 105 mmol/L (ref 101–111)
Creatinine, Ser: 1.3 mg/dL — ABNORMAL HIGH (ref 0.61–1.24)
GFR calc Af Amer: >60 mL/min (ref >60)
GFR, EST NON AFRICAN AMERICAN: 55 mL/min — AB (ref >60)
GLUCOSE: 93 mg/dL (ref 65–99)
POTASSIUM: 4.5 mmol/L (ref 3.5–5.1)
Sodium: 140 mmol/L (ref 135–145)

## 2016-09-17 LAB — PROTIME-INR
INR: 1.04
Prothrombin Time: 13.7 seconds (ref 11.4–15.2)

## 2016-09-17 LAB — APTT: aPTT: 36 seconds (ref 24–36)

## 2016-09-17 LAB — SURGICAL PCR SCREEN
MRSA, PCR: NEGATIVE
STAPHYLOCOCCUS AUREUS: NEGATIVE

## 2016-09-17 NOTE — Progress Notes (Signed)
BMP done 09/17/16 routed to Dr. Theda Sers via epic

## 2016-09-17 NOTE — H&P (Signed)
TOTAL KNEE ADMISSION H&P  Patient is being admitted for right total knee arthroplasty.  Subjective:  Chief Complaint:right knee pain.  HPI: Dakota Thompson, 71 y.o. male, has a history of pain and functional disability in the right knee due to arthritis and has failed non-surgical conservative treatments for greater than 12 weeks to includecorticosteriod injections, viscosupplementation injections, flexibility and strengthening excercises, weight reduction as appropriate and activity modification.  Onset of symptoms was gradual, starting 2 years ago with gradually worsening course since that time. The patient noted prior procedures on the knee to include  cyst removal on the right knee(s).  Patient currently rates pain in the right knee(s) at 8 out of 10 with activity. Patient has worsening of pain with activity and weight bearing, pain that interferes with activities of daily living and pain with passive range of motion.  Patient has evidence of subchondral sclerosis and joint space narrowing by imaging studies. There is no active infection.   Patient Active Problem List   Diagnosis Date Noted  . History of colonic polyps   . Diverticulosis of colon without hemorrhage   . Hiatal hernia   . Hx of adenomatous colonic polyps 01/11/2015  . GERD (gastroesophageal reflux disease) 01/11/2015  . Constipation 01/11/2015  . Restless leg syndrome 01/07/2013  . Unspecified hereditary and idiopathic peripheral neuropathy 01/07/2013  . Mild cognitive impairment 01/07/2013  . Pancytopenia 02/27/2011  . Viral syndrome 02/27/2011   Past Medical History:  Diagnosis Date  . 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 Hx of adenomatous colonic polyps    tubulovillous adenoma  . Hypertension   . Pancytopenia    mild  . Pancytopenia 02/27/2011  . Restless leg syndrome   . Sleep apnea    wears CPAP/BIPAP every night  . Thyroid disease    "normal now"  . Viral syndrome     Hx  . Viral syndrome 02/27/2011    Past Surgical History:  Procedure Laterality Date  . COLONOSCOPY  01/02/2010   RMR: normal rectum and colon. marginal prep compromised exam.   . COLONOSCOPY N/A 03/15/2015   Procedure: COLONOSCOPY;  Surgeon: Daneil Dolin, MD;  Location: [facility];  Service: Endoscopy;  Laterality: N/A;  1215 - moved to 12:45 - office to notify  . ESOPHAGOGASTRODUODENOSCOPY N/A 03/15/2015   Procedure: ESOPHAGOGASTRODUODENOSCOPY (EGD);  Surgeon: Daneil Dolin, MD;  Location: [facility];  Service: Endoscopy;  Laterality: N/A;  . GANGLION CYST EXCISION    . knee cartilage repair  01/31/11  . NASAL SINUS SURGERY      No prescriptions prior to admission.   No Known Allergies  Social History  Substance Use Topics  . Smoking status: Former Smoker    Types: Cigarettes    Quit date: 05/22/1981  . Smokeless tobacco: Never Used  . Alcohol use No    Family History  Problem Relation Age of Onset  . Cancer Brother     unknown type  . Cancer Sister     unknown type  . Colon cancer Neg Hx      Review of Systems  Constitutional: Negative.   HENT: Positive for hearing loss.   Eyes: Negative.   Respiratory: Positive for shortness of breath.   Cardiovascular: Negative.  Negative for chest pain.  Gastrointestinal: Negative.   Genitourinary: Negative.   Musculoskeletal: Positive for joint pain. Negative for falls.  Skin: Negative.   Neurological: Negative.   Endo/Heme/Allergies: Negative.   Psychiatric/Behavioral:  Negative.     Objective:  Physical Exam  Vital signs in last 24 hours: BP: ()/()  Arterial Line BP: ()/()   Labs:   Estimated body mass index is 37.95 kg/m as calculated from the following:   Height as of 09/13/15: 5\' 9"  (1.753 m).   Weight as of 09/13/15: 116.6 kg (257 lb).   Imaging Review Plain radiographs demonstrate moderate degenerative joint disease of the right knee(s). The overall alignment ismild varus. The bone quality appears  to be good for age and reported activity level.  Assessment/Plan:  End stage arthritis, right knee   The patient history, physical examination, clinical judgment of the provider and imaging studies are consistent with end stage degenerative joint disease of the right knee(s) and total knee arthroplasty is deemed medically necessary. The treatment options including medical management, injection therapy arthroscopy and arthroplasty were discussed at length. The risks and benefits of total knee arthroplasty were presented and reviewed. The risks due to aseptic loosening, infection, stiffness, patella tracking problems, thromboembolic complications and other imponderables were discussed. The patient acknowledged the explanation, agreed to proceed with the plan and consent was signed. Patient is being admitted for inpatient treatment for surgery, pain control, PT, OT, prophylactic antibiotics, VTE prophylaxis, progressive ambulation and ADL's and discharge planning. The patient is planning to be discharged home with home health services   Will use IV tranexamic acid. Contraindications and adverse affects of Tranexamic acid discussed in detail. Patient denies any of these at this time and understands the risks and benefits.

## 2016-09-18 ENCOUNTER — Encounter: Payer: Self-pay | Admitting: Neurology

## 2016-09-18 ENCOUNTER — Ambulatory Visit (INDEPENDENT_AMBULATORY_CARE_PROVIDER_SITE_OTHER): Payer: Medicare Other | Admitting: Neurology

## 2016-09-18 VITALS — BP 128/62 | HR 78 | Ht 69.0 in | Wt 255.0 lb

## 2016-09-18 DIAGNOSIS — G4733 Obstructive sleep apnea (adult) (pediatric): Secondary | ICD-10-CM | POA: Diagnosis not present

## 2016-09-18 DIAGNOSIS — Z9989 Dependence on other enabling machines and devices: Secondary | ICD-10-CM | POA: Diagnosis not present

## 2016-09-18 DIAGNOSIS — R419 Unspecified symptoms and signs involving cognitive functions and awareness: Secondary | ICD-10-CM

## 2016-09-18 NOTE — Progress Notes (Signed)
Subjective:    Dakota Thompson ID: Dakota Thompson is a 71 y.o. male.  HPI    Interim history:   Dakota Thompson is a 71 year old right-handed gentleman with an underlying medical history of neuropathy, obesity and restless leg syndrome and mild memory loss, who presents for follow-up consultation of Dakota Thompson obstructive sleep apnea, treated with BiPAP. The Dakota Thompson is accompanied by Dakota Thompson wife today. I last saw Dakota Thompson on 09/13/2015, at which time he was fully compliant with BiPAP. He had gained a little bit of weight. We talked about Dakota Thompson cognitive test results at the time. He had seen Dr. Valentina Shaggy on 06/11/2015 and then for discussion on 06/21/2015, diagnosis was memory loss. Repeat cognitive testing was recommended after one or 2 years for comparison.   Today, 09/18/2016 (all dictated new, as well as above notes, some dictation done in note pad or Word, outside of chart, may appear as copied):   I reviewed Dakota Thompson BiPAP compliance from 08/19/2016 through 09/17/2016 which is a total of 30 days, during which time he used Dakota Thompson machine every night with percent used days greater than 4 hours at 100%, indicating superb compliance with an average usage of 8 hours and 24 minutes, residual AHI low at 1.7 per hour, pressure of 12/8, leak very low. He reports doing okay with Dakota Thompson BiPAP. Memory is stable. Has bilateral knee pain, is scheduled for right knee replacement surgery in a couple weeks from now. He was advised to find out the settings on Dakota Thompson machine. He has been using [employer] fullface mask.  Previously:   I saw Dakota Thompson on 03/08/2015, at which time he reported being able to tolerate BiPAP a little bit better. Memory-wise, he felt a little worse. He had knee pain. I reviewed Dakota Thompson brain MRI from 03/07/2014 through the PACS system today. I suggested we pursue formal neuropsychological evaluation. I referred Dakota Thompson to Dr. Valentina Shaggy.    I reviewed Dakota Thompson BiPAP compliance data from 08/14/2015 through 09/12/2015 which is a total of 30 days during which time  he used Dakota Thompson machine every night with percent used days greater than 4 hours at 100%, indicating superb compliance with an average usage of 8 hours and 13 minutes, residual AHI 1.2 per hour, leak low, pressure at 12/8 cm.    I saw Dakota Thompson on 09/15/2014, at which time he reported R cataract surgery on 08/03/14 and L cataract repair on 08/31/14 with good results. Dakota Thompson wife was worried about Dakota Thompson lack of mobility. He was not exercising. He was reporting knee pain. He was not drinking enough water. He was struggling with BiPAP treatment. Dakota Thompson wife had noted less twitching of Dakota Thompson legs and less snoring and he seemed to sleep more consolidated. As far as Dakota Thompson memory, Dakota Thompson long-term memory much improved either. He felt he had issues with OCD. He could not tolerate the chin strap.     I reviewed Dakota Thompson BiPAP compliance data from 02/06/2015 through 03/07/2015 which is a total of 30 days during which time he used Dakota Thompson machine every night with percent used days greater than 4 hours at 100%, indicating superb compliance with an average usage of 7 hours and 36 minutes, pressure of 12/8, residual AHI 1.3, leaked low.    He had a brain MRI wo contrast on 03/08/15: Mild cerebral atrophy, otherwise unremarkable appearance of the brain.   I first met Dakota Thompson on 12/30/2013 at the request of Dr. Janann Colonel, at which time we talked about Dakota Thompson recent sleep test results including Dakota Thompson baseline sleep study from  March 2015 as well as a CPAP titration study from April 2015 and we talked about Dakota Thompson compliance data on how he felt. He reported sleeping better and he was twitching less during Dakota Thompson sleep. In the interim, he was seen by Dr. Janann Colonel on 02/24/2014 for follow-up of Dakota Thompson MCI. Dakota Thompson MOCA score was 27 at the time.   I reviewed Dakota Thompson BiPAP compliance data from 03/24/14 to 06/21/14, which is a total of 90 days, during which time he used Dakota Thompson machine every night, with percent used days greater than 4 hours of 91.1%, indicating excellent compliance, with an average  usage of 6 hours and 28 minutes, pressure at 12/8, residual AHI at 1.5 per hour, leak low.   I reviewed Dakota Thompson BiPAP compliance data from 08/16/2014 through 09/14/2014 which is a total of 30 days during which time he used Dakota Thompson machine every night with percent used days greater than 4 hours at 96.7%, indicating excellent compliance. Pressure setting the same, residual AHI low at 1.7 per hour, leak low. Average usage of 6 hours and 37 minutes for all nights.    Dakota Thompson baseline sleep study from 09/06/2013 showed a sleep efficiency was reduced at 66.6% with a latency to sleep of 13.5 minutes and wake after sleep onset of 131 minutes with moderate sleep fragmentation noted. He had an elevated arousal index. He had an increased percentage of stage II sleep, 5.6% of slow-wave sleep, and 19.1% of REM sleep with a significantly reduced REM latency of 2 minutes. He had mild periodic leg movements at 17.9 per hour resulting in 4 arousals per hour. He had mild to moderate snoring. He did not achieve much in the way of supine sleep reporting that he's not able to sleep on Dakota Thompson back. He had one central apnea and 25 obstructive hypopneas with a total AHI of 5.4 per hour, rising to 9.8 per hour in REM sleep. Baseline oxygen saturation was only 90%, nadir was 84%. Time below 88% saturation was 8 minutes and 18 seconds. He was then requested to come back for a CPAP titration study to help Dakota Thompson sleep disordered breathing and in light of Dakota Thompson significant desaturations. He had a CPAP titration study on 10/06/2013 which showed a sleep efficiency of 65.4% with a prolonged sleep latency of 59.5 minutes and wake after sleep onset of 106.5 minutes with moderate sleep fragmentation noted. He had an elevated arousal index at 13.4 per hour primarily because of spontaneous arousals. He had an increased percentage of light stage sleep, 1.6% of slow-wave sleep, and a mildly decreased percentage of REM sleep at 17.5% with a prolonged REM latency. He had  mild PLMS a 10.7 per hour with an associated arousal index of 1.7 per hour. Baseline oxygen saturation was 91%, nadir was 88%. He was started on CPAP at 5 cm. However he was not able to tolerate this and felt panicked. He did not tolerate the nasal pillows mask or nasal mask and did somewhat better with a full facemask. He was switched to BiPAP for better tolerance of the pressures were titrated from 8/4 cm to 12/8 cm. Dakota Thompson oxygen saturation appeared to be better on the final setting of 12/8 cm. REM sleep was achieved on the final pressure but very little supine sleep was achieved during the study. Dakota Thompson AHI was 0 on the final pressure. Based on the test results I prescribed BiPAP for Dakota Thompson.     I reviewed the Dakota Thompson's PAP compliance data from 11/02/13 to 12/01/2013, which is a  total of 30 days, during which time the Dakota Thompson used BiPAP.  The average usage for all days was 6 hours and 29 minutes. The percent used days greater than 4 hours was 100 %, indicating superb compliance. The residual AHI was 2.1 per hour, indicating an adequate treatment pressure of 12/8 cwp with low leak reported.   Dakota Thompson typical bedtime is reported to be around 11 PM. He reports that he does not really like using Dakota Thompson BiPAP but he is getting used to it. Provides report he seems to sleep more soundly and is less restless in Dakota Thompson sleep. He has not noted any improvement in Dakota Thompson cognitive function yet. He is wondering if he will have to use this machine longer term and whether he has to keep using it.    Dakota Thompson Past Medical History Is Significant For: Past Medical History:  Diagnosis Date  . Anxiety   . 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 GERD (gastroesophageal reflux disease)   . Hx of adenomatous colonic polyps    tubulovillous adenoma  . Hypertension   . Pancytopenia    mild  . Pancytopenia 02/27/2011  . Restless leg syndrome   . Sleep apnea    wears CPAP/BIPAP every night  .  Thyroid disease   . Viral syndrome    Hx  . Viral syndrome 02/27/2011    Dakota Thompson Past Surgical History Is Significant For: Past Surgical History:  Procedure Laterality Date  . COLONOSCOPY  01/02/2010   RMR: normal rectum and colon. marginal prep compromised exam.   . COLONOSCOPY N/A 03/15/2015   Procedure: COLONOSCOPY;  Surgeon: Daneil Dolin, MD;  Location: [facility];  Service: Endoscopy;  Laterality: N/A;  1215 - moved to 12:45 - office to notify  . ESOPHAGOGASTRODUODENOSCOPY N/A 03/15/2015   Procedure: ESOPHAGOGASTRODUODENOSCOPY (EGD);  Surgeon: Daneil Dolin, MD;  Location: [facility];  Service: Endoscopy;  Laterality: N/A;  . EYE SURGERY     bilateral cataracts  . GANGLION CYST EXCISION    . knee cartilage repair  01/31/11  . NASAL SINUS SURGERY      Dakota Thompson Family History Is Significant For: Family History  Problem Relation Age of Onset  . Cancer Brother     unknown type  . Cancer Sister     unknown type  . Colon cancer Neg Hx     Dakota Thompson Social History Is Significant For: Social History   Social History  . Marital status: Married    Spouse name: Adela Lank   . Number of children: 2  . Years of education: N/A   Occupational History  . Retired Retired   Social History Main Topics  . Smoking status: Former Smoker    Types: Cigarettes    Quit date: 05/22/1981  . Smokeless tobacco: Never Used  . Alcohol use No  . Drug use: No  . Sexual activity: Yes   Other Topics Concern  . None   Social History Narrative   Dakota Thompson lives at home with Dakota Thompson wife.    Dakota Thompson has 2 children.    Dakota Thompson is retired.     Dakota Thompson Allergies Are:  No Known Allergies:   Dakota Thompson Current Medications Are:  Outpatient Encounter Prescriptions as of 09/18/2016  Medication Sig  . aspirin EC 81 MG tablet Take 81 mg by mouth daily.  . celecoxib (CELEBREX) 100 MG capsule Take 100 mg by mouth 2 (two) times daily.   . Coenzyme Q10 200 MG  capsule Take 200 mg by mouth every evening.  . docusate sodium  (COLACE) 100 MG capsule Take 200 mg by mouth daily.  Marland Kitchen gabapentin (NEURONTIN) 300 MG capsule Take 600 mg by mouth at bedtime.   Marland Kitchen gemfibrozil (LOPID) 600 MG tablet Take 600 mg by mouth 2 (two) times daily.  . Multiple Vitamin (MULTIVITAMIN WITH MINERALS) TABS tablet Take 1 tablet by mouth daily. Centrum Silver  . olmesartan (BENICAR) 40 MG tablet Take 40 mg by mouth daily.  . Omega-3 Fatty Acids (FISH OIL) 1200 MG CAPS Take 1,200-2,400 mg by mouth 2 (two) times daily. 2400 mg in the morning & 1200 mg in the evening.  Marland Kitchen omeprazole (PRILOSEC) 20 MG capsule Take 20 mg by mouth 2 (two) times daily.    . pravastatin (PRAVACHOL) 20 MG tablet Take 20 mg by mouth every evening.    No facility-administered encounter medications on file as of 09/18/2016.   : Physical Examination:   Review of Systems  Neurological:       Dakota Thompson states that he does not enjoy using CPAP but overall is doing ok.  He had an event on 2/20, reports that he felt that he had "swallowed Dakota Thompson tongue" or "swallowed a big glob of bubble gum" he asks if that was an apneic episode.  Dakota Thompson is due to have surgery on 4/13, staff will need to know Dakota Thompson CPAP settings.     Objective:  Neurologic Exam  Physical Exam  Vitals:   09/18/16 1612  BP: 128/62  Pulse: 78   General Examination: The Dakota Thompson is a very pleasant 71 y.o. male in no acute distress. He appears well-developed and well-nourished and well groomed.   HEENT: Normocephalic, atraumatic, pupils are equal, round and reactive to light and accommodation. Extraocular tracking is good without limitation to gaze excursion or nystagmus noted. Normal smooth pursuit is noted. Hearing is Mildly impaired. He has bilateral hearing aids. Face is symmetric with normal facial animation and normal facial sensation. Speech is clear with no dysarthria noted. There is no hypophonia. There is no lip, neck/head, jaw or voice tremor. Neck is supple with full range of passive and active motion.  There are no carotid bruits on auscultation. Oropharynx exam reveals: mild mouth dryness, adequate dental hygiene and moderate airway crowding.   Chest: Clear to auscultation without wheezing, rhonchi or crackles noted.  Heart: S1+S2+0, regular and normal without murmurs, rubs or gallops noted.   Abdomen: Soft, non-tender and non-distended with normal bowel sounds appreciated on auscultation.  Extremities: There is no pitting edema in the distal lower extremities bilaterally. Pedal pulses are intact.  Skin: Warm and dry without trophic changes noted.  Musculoskeletal: exam reveals bilateral knee pain, he reports more pain on the left than the right.   Neurologically:  Mental status: The Dakota Thompson is awake, alert and oriented in all 4 spheres. Dakota Thompson immediate and remote memory, attention, language skills and fund of knowledge are fairly good.  There is no evidence of aphasia, agnosia, apraxia or anomia. Speech is clear with normal prosody and enunciation. Thought process is linear. Mood is normal and affect is normal.   On 03/08/2015: [place] 26/30.  On 09/18/2016: MMSE: 29/30, CDT: 4/4, AFT: 18/min.  Cranial nerves II - XII are as described above under HEENT exam. In addition: shoulder shrug is normal with equal shoulder height noted. Motor exam: Normal bulk, strength and tone is noted. There is no drift, tremor or rebound. Romberg is negative. Reflexes are 2+ throughout, trace in the  ankles. Fine motor skills and coordination: intact.  Cerebellar testing: No dysmetria or intention tremor. There is no truncal or gait ataxia.  Sensory exam: intact to light touch in the upper and lower extremities.  Gait, station and balance: He stands with mild difficulty, he walks with a slight limp, tandem walk is not possible, otherwise posture is age-appropriate.  Assessment and Plan:  In summary, Dakota Thompson is a very pleasant 71 y.o.-year old male with An underlying medical history of hearing loss,  cognitive complaints, restless leg syndrome, history of neuropathy, arthritis of both knees, and obesity, who presents for follow-up consultation of Dakota Thompson obstructive sleep apnea. He has been stable on BiPAP therapy at a pressure of 12/8 cm with full compliance good results. Memory scores have been stable. He had neuropsychological testing in December 2016 with reassuring findings and we can certainly repeat in the future, maybe next year. Physical exam is stable, he will need knee replacement surgery and is scheduled for right total knee replacement next month. He is commended for Dakota Thompson BiPAP adherence. I suggested a one-year checkup. I wrote down Dakota Thompson BiPAP settings for Dakota Thompson for reference. I answered all their questions today and the Dakota Thompson and Dakota Thompson wife were in agreement.  I spent 20 minutes in total face-to-face time with the Dakota Thompson, more than 50% of which was spent in counseling and coordination of care, reviewing test results, reviewing medication and discussing or reviewing the diagnosis of OSA, cognitive complaints, the prognosis and treatment options. Pertinent laboratory and imaging test results that were available during this visit with the Dakota Thompson were reviewed by me and considered in my medical decision making (see chart for details).

## 2016-09-18 NOTE — Progress Notes (Deleted)
Subjective:    Patient ID: Dakota Thompson is a 71 y.o. male.  HPI {Common ambulatory SmartLinks:[id number]}  Review of Systems  Objective:  Neurologic Exam  Physical Exam  Assessment:   ***  Plan:   ***

## 2016-09-18 NOTE — Patient Instructions (Addendum)
You are fully compliant with your BiPAP. Your sleep apnea in under control on the setting of 12/8 cm. You use a full face mask from [employer]. Good luck with your knee surgery! Keep up the good work with your BiPAP! I will see you back in 1 year.

## 2016-09-29 NOTE — Progress Notes (Signed)
Pt. Dakota Thompson called regarding medications . He asked what he should stop taking prior to surgery. I instructed him to call his Dr. Regarding what he is to stop prior to surgery . I can only instruct him on what to take the morning of surgery. Pt. Stated he had his preop instructions and would follow those on what to take morning of surgery.

## 2016-09-30 ENCOUNTER — Telehealth: Payer: Self-pay | Admitting: Neurology

## 2016-09-30 DIAGNOSIS — G4733 Obstructive sleep apnea (adult) (pediatric): Secondary | ICD-10-CM

## 2016-09-30 NOTE — Telephone Encounter (Signed)
Order for bipap supplies faxed to [facility]. Received a receipt of confirmation.

## 2016-09-30 NOTE — Telephone Encounter (Signed)
[place] with [facility] home health care called office in reference to needing a verbal order for bipap supplies.  Please call

## 2016-09-30 NOTE — Addendum Note (Signed)
Addended by: Lester Lesslie A on: 09/30/2016 05:20 PM   Modules accepted: Orders

## 2016-09-30 NOTE — Telephone Encounter (Signed)
Pt calling because the order for the refill of supplies for the CPAP machine has not been filled yet. Also  The pressure/settings for the CPAP machine are needed so that when he has his knee replacement on Friday the hospital can have that for the CPAP machine they will let him use while he is there.

## 2016-09-30 NOTE — Telephone Encounter (Signed)
I spoke to Dr. Rexene Alberts, received a verbal order for pt's bipap supplies to be renewed. I called pt and advised him that his bipap pressure is still 12/8 cm H2O, as Dr. Rexene Alberts put in her last OV note with this pt. It is on his AVS as well. I advised the pt that I will send the bipap supplies order to [facility] today. Pt verbalized understanding and appreciation.

## 2016-10-03 ENCOUNTER — Inpatient Hospital Stay (HOSPITAL_COMMUNITY): Payer: Medicare Other | Admitting: Anesthesiology

## 2016-10-03 ENCOUNTER — Encounter (HOSPITAL_COMMUNITY)
Admission: RE | Disposition: A | Discharge status: Home Health | Payer: Self-pay | Source: Ambulatory Visit | Attending: Specialist

## 2016-10-03 ENCOUNTER — Encounter (HOSPITAL_COMMUNITY): Payer: Self-pay | Admitting: *Deleted

## 2016-10-03 ENCOUNTER — Inpatient Hospital Stay (HOSPITAL_COMMUNITY)
Admission: RE | Admit: 2016-10-03 | Discharge: 2016-10-06 | DRG: 470 | Disposition: A | Discharge status: Home Health | Payer: Medicare Other | Source: Ambulatory Visit | Attending: Specialist | Admitting: Specialist

## 2016-10-03 ENCOUNTER — Other Ambulatory Visit: Payer: Self-pay | Admitting: Specialist

## 2016-10-03 DIAGNOSIS — Z87891 Personal history of nicotine dependence: Secondary | ICD-10-CM | POA: Diagnosis not present

## 2016-10-03 DIAGNOSIS — I10 Essential (primary) hypertension: Secondary | ICD-10-CM | POA: Diagnosis present

## 2016-10-03 DIAGNOSIS — Z96659 Presence of unspecified artificial knee joint: Secondary | ICD-10-CM

## 2016-10-03 DIAGNOSIS — J189 Pneumonia, unspecified organism: Secondary | ICD-10-CM

## 2016-10-03 DIAGNOSIS — K219 Gastro-esophageal reflux disease without esophagitis: Secondary | ICD-10-CM | POA: Diagnosis present

## 2016-10-03 DIAGNOSIS — M1711 Unilateral primary osteoarthritis, right knee: Principal | ICD-10-CM | POA: Diagnosis present

## 2016-10-03 DIAGNOSIS — Z09 Encounter for follow-up examination after completed treatment for conditions other than malignant neoplasm: Secondary | ICD-10-CM

## 2016-10-03 DIAGNOSIS — J9811 Atelectasis: Secondary | ICD-10-CM

## 2016-10-03 DIAGNOSIS — G4730 Sleep apnea, unspecified: Secondary | ICD-10-CM | POA: Diagnosis present

## 2016-10-03 DIAGNOSIS — K449 Diaphragmatic hernia without obstruction or gangrene: Secondary | ICD-10-CM | POA: Diagnosis present

## 2016-10-03 HISTORY — PX: TOTAL KNEE ARTHROPLASTY: SHX125

## 2016-10-03 LAB — TYPE AND SCREEN
ABO/RH(D): O POS
ANTIBODY SCREEN: NEGATIVE

## 2016-10-03 LAB — ABO/RH: ABO/RH(D): O POS

## 2016-10-03 SURGERY — ARTHROPLASTY, KNEE, TOTAL
Anesthesia: Spinal | Site: Knee | Laterality: Right

## 2016-10-03 MED ORDER — GABAPENTIN 300 MG PO CAPS
600.0000 mg | ORAL_CAPSULE | ORAL | Status: DC
  Administered 2016-10-04 – 2016-10-05 (×2): 600 mg via ORAL
  Filled 2016-10-03 (×2): qty 2

## 2016-10-03 MED ORDER — PROPOFOL 10 MG/ML IV BOLUS
INTRAVENOUS | Status: AC
  Filled 2016-10-03: qty 20

## 2016-10-03 MED ORDER — HYDROMORPHONE HCL 2 MG/ML IJ SOLN
1.0000 mg | INTRAMUSCULAR | Status: DC | PRN
Start: 2016-10-03 — End: 2016-10-06
  Administered 2016-10-03 – 2016-10-04 (×4): 1 mg via INTRAVENOUS
  Filled 2016-10-03 (×4): qty 1

## 2016-10-03 MED ORDER — METHOCARBAMOL 500 MG PO TABS: 500.0000 mg | ORAL_TABLET | Freq: Three times a day (TID) | ORAL | 2 refills | Status: DC | PRN

## 2016-10-03 MED ORDER — METOCLOPRAMIDE HCL 5 MG/ML IJ SOLN: 5.0000 mg | Freq: Once | INTRAMUSCULAR | Status: AC

## 2016-10-03 MED ORDER — PHENOL 1.4 % MT LIQD
1.0000 | OROMUCOSAL | Status: DC | PRN
  Filled 2016-10-03: qty 177

## 2016-10-03 MED ORDER — METHOCARBAMOL 1000 MG/10ML IJ SOLN
500.0000 mg | Freq: Four times a day (QID) | INTRAVENOUS | Status: DC | PRN
  Administered 2016-10-03: 500 mg via INTRAVENOUS
  Filled 2016-10-03: qty 5
  Filled 2016-10-03: qty 550

## 2016-10-03 MED ORDER — ACETAMINOPHEN 650 MG RE SUPP: 650.0000 mg | Freq: Four times a day (QID) | RECTAL | Status: DC | PRN

## 2016-10-03 MED ORDER — ASPIRIN EC 325 MG PO TBEC: 325.0000 mg | DELAYED_RELEASE_TABLET | Freq: Two times a day (BID) | ORAL | 0 refills | Status: DC

## 2016-10-03 MED ORDER — ALUM & MAG HYDROXIDE-SIMETH 200-200-20 MG/5ML PO SUSP
30.0000 mL | ORAL | Status: DC | PRN
  Administered 2016-10-05: 30 mL via ORAL
  Filled 2016-10-03: qty 30

## 2016-10-03 MED ORDER — MENTHOL 3 MG MT LOZG: 1.0000 | LOZENGE | OROMUCOSAL | Status: DC | PRN

## 2016-10-03 MED ORDER — ACETAMINOPHEN 325 MG PO TABS
650.0000 mg | ORAL_TABLET | Freq: Four times a day (QID) | ORAL | Status: DC | PRN
  Administered 2016-10-04 – 2016-10-05 (×3): 650 mg via ORAL
  Filled 2016-10-03 (×3): qty 2

## 2016-10-03 MED ORDER — ENOXAPARIN SODIUM 30 MG/0.3ML ~~LOC~~ SOLN
30.0000 mg | Freq: Two times a day (BID) | SUBCUTANEOUS | Status: DC
  Administered 2016-10-04 – 2016-10-06 (×5): 30 mg via SUBCUTANEOUS
  Filled 2016-10-03 (×5): qty 0.3

## 2016-10-03 MED ORDER — FLEET ENEMA 7-19 GM/118ML RE ENEM: 1.0000 | ENEMA | Freq: Once | RECTAL | Status: DC | PRN

## 2016-10-03 MED ORDER — KETOROLAC TROMETHAMINE 30 MG/ML IJ SOLN
INTRAMUSCULAR | Status: AC
  Filled 2016-10-03: qty 1

## 2016-10-03 MED ORDER — PHENYLEPHRINE HCL 10 MG/ML IJ SOLN
INTRAVENOUS | Status: DC | PRN
  Administered 2016-10-03: 40 ug/min via INTRAVENOUS

## 2016-10-03 MED ORDER — SODIUM CHLORIDE 0.9 % IJ SOLN
INTRAMUSCULAR | Status: AC
  Filled 2016-10-03: qty 50

## 2016-10-03 MED ORDER — DOCUSATE SODIUM 100 MG PO CAPS
100.0000 mg | ORAL_CAPSULE | Freq: Two times a day (BID) | ORAL | Status: DC
  Administered 2016-10-04 – 2016-10-06 (×5): 100 mg via ORAL
  Filled 2016-10-03 (×5): qty 1

## 2016-10-03 MED ORDER — METOCLOPRAMIDE HCL 5 MG/ML IJ SOLN
INTRAMUSCULAR | Status: AC
  Administered 2016-10-03: 5 mg
  Filled 2016-10-03: qty 2

## 2016-10-03 MED ORDER — FENTANYL CITRATE (PF) 100 MCG/2ML IJ SOLN: 25.0000 ug | INTRAMUSCULAR | Status: DC | PRN

## 2016-10-03 MED ORDER — ONDANSETRON HCL 4 MG/2ML IJ SOLN
INTRAMUSCULAR | Status: DC | PRN
  Administered 2016-10-03: 4 mg via INTRAVENOUS

## 2016-10-03 MED ORDER — PROPOFOL 10 MG/ML IV BOLUS
INTRAVENOUS | Status: AC
  Filled 2016-10-03: qty 40

## 2016-10-03 MED ORDER — FENTANYL CITRATE (PF) 100 MCG/2ML IJ SOLN
50.0000 ug | INTRAMUSCULAR | Status: DC | PRN
  Administered 2016-10-03: 75 ug via INTRAVENOUS

## 2016-10-03 MED ORDER — POVIDONE-IODINE 7.5 % EX SOLN: Freq: Once | CUTANEOUS | Status: DC

## 2016-10-03 MED ORDER — TRANEXAMIC ACID 1000 MG/10ML IV SOLN
1000.0000 mg | INTRAVENOUS | Status: AC
  Administered 2016-10-03: 1000 mg via INTRAVENOUS
  Filled 2016-10-03: qty 1100

## 2016-10-03 MED ORDER — PANTOPRAZOLE SODIUM 40 MG PO TBEC
40.0000 mg | DELAYED_RELEASE_TABLET | Freq: Every day | ORAL | Status: DC
  Administered 2016-10-04 – 2016-10-06 (×3): 40 mg via ORAL
  Filled 2016-10-03 (×3): qty 1

## 2016-10-03 MED ORDER — SODIUM CHLORIDE 0.9 % IR SOLN
Status: DC | PRN
  Administered 2016-10-03: 1000 mL

## 2016-10-03 MED ORDER — KETOROLAC TROMETHAMINE 30 MG/ML IJ SOLN
INTRAMUSCULAR | Status: DC | PRN
  Administered 2016-10-03: 30 mg

## 2016-10-03 MED ORDER — ALBUMIN HUMAN 5 % IV SOLN
INTRAVENOUS | Status: DC | PRN
  Administered 2016-10-03: 16:00:00 via INTRAVENOUS

## 2016-10-03 MED ORDER — GABAPENTIN 300 MG PO CAPS
600.0000 mg | ORAL_CAPSULE | Freq: Once | ORAL | Status: AC
  Administered 2016-10-03: 600 mg via ORAL

## 2016-10-03 MED ORDER — OXYCODONE HCL 5 MG PO TABS
5.0000 mg | ORAL_TABLET | ORAL | Status: DC | PRN
  Administered 2016-10-04 (×2): 10 mg via ORAL
  Filled 2016-10-03 (×2): qty 2

## 2016-10-03 MED ORDER — DEXAMETHASONE SODIUM PHOSPHATE 10 MG/ML IJ SOLN
10.0000 mg | Freq: Once | INTRAMUSCULAR | Status: AC
  Administered 2016-10-03: 10 mg via INTRAVENOUS

## 2016-10-03 MED ORDER — DEXAMETHASONE SODIUM PHOSPHATE 10 MG/ML IJ SOLN
INTRAMUSCULAR | Status: AC
  Filled 2016-10-03: qty 1

## 2016-10-03 MED ORDER — OXYCODONE HCL 5 MG PO TABS: 5.0000 mg | ORAL_TABLET | ORAL | 0 refills | Status: DC | PRN

## 2016-10-03 MED ORDER — GABAPENTIN 300 MG PO CAPS
ORAL_CAPSULE | ORAL | Status: AC
  Filled 2016-10-03: qty 2

## 2016-10-03 MED ORDER — 0.9 % SODIUM CHLORIDE (POUR BTL) OPTIME
TOPICAL | Status: DC | PRN
  Administered 2016-10-03: 1000 mL

## 2016-10-03 MED ORDER — SODIUM CHLORIDE 0.9 % IJ SOLN
INTRAMUSCULAR | Status: DC | PRN
  Administered 2016-10-03: 30 mL

## 2016-10-03 MED ORDER — BUPIVACAINE HCL (PF) 0.5 % IJ SOLN
INTRAMUSCULAR | Status: DC | PRN
  Administered 2016-10-03: 3 mL via INTRATHECAL

## 2016-10-03 MED ORDER — OXYCODONE HCL 5 MG PO TABS
ORAL_TABLET | ORAL | Status: AC
  Filled 2016-10-03: qty 1

## 2016-10-03 MED ORDER — LACTATED RINGERS IV SOLN
INTRAVENOUS | Status: DC
  Administered 2016-10-03 (×2): via INTRAVENOUS

## 2016-10-03 MED ORDER — PROPOFOL 10 MG/ML IV BOLUS
INTRAVENOUS | Status: AC
  Filled 2016-10-03: qty 60

## 2016-10-03 MED ORDER — BUPIVACAINE HCL (PF) 0.5 % IJ SOLN
INTRAMUSCULAR | Status: AC
  Filled 2016-10-03: qty 30

## 2016-10-03 MED ORDER — PRAVASTATIN SODIUM 20 MG PO TABS
20.0000 mg | ORAL_TABLET | Freq: Every evening | ORAL | Status: DC
  Administered 2016-10-04 – 2016-10-05 (×2): 20 mg via ORAL
  Filled 2016-10-03 (×2): qty 1

## 2016-10-03 MED ORDER — BUPIVACAINE-EPINEPHRINE 0.25% -1:200000 IJ SOLN
INTRAMUSCULAR | Status: DC | PRN
  Administered 2016-10-03: 30 mL

## 2016-10-03 MED ORDER — ONDANSETRON HCL 4 MG/2ML IJ SOLN: 4.0000 mg | Freq: Four times a day (QID) | INTRAMUSCULAR | Status: DC | PRN

## 2016-10-03 MED ORDER — ONDANSETRON HCL 4 MG PO TABS
4.0000 mg | ORAL_TABLET | Freq: Four times a day (QID) | ORAL | Status: DC | PRN
  Administered 2016-10-05: 4 mg via ORAL
  Filled 2016-10-03: qty 1

## 2016-10-03 MED ORDER — CEFAZOLIN SODIUM-DEXTROSE 2-4 GM/100ML-% IV SOLN
2.0000 g | INTRAVENOUS | Status: AC
  Administered 2016-10-03: 2 g via INTRAVENOUS
  Filled 2016-10-03: qty 100

## 2016-10-03 MED ORDER — ONDANSETRON HCL 4 MG/2ML IJ SOLN
INTRAMUSCULAR | Status: AC
  Filled 2016-10-03: qty 2

## 2016-10-03 MED ORDER — DEXAMETHASONE SODIUM PHOSPHATE 10 MG/ML IJ SOLN
10.0000 mg | Freq: Once | INTRAMUSCULAR | Status: AC
  Administered 2016-10-04: 10 mg via INTRAVENOUS
  Filled 2016-10-03: qty 1

## 2016-10-03 MED ORDER — PHENYLEPHRINE HCL 10 MG/ML IJ SOLN
INTRAMUSCULAR | Status: AC
  Filled 2016-10-03: qty 1

## 2016-10-03 MED ORDER — METOCLOPRAMIDE HCL 5 MG/ML IJ SOLN: 5.0000 mg | Freq: Three times a day (TID) | INTRAMUSCULAR | Status: DC | PRN

## 2016-10-03 MED ORDER — OXYCODONE HCL 5 MG PO TABS
ORAL_TABLET | ORAL | Status: AC
  Administered 2016-10-03: 5 mg
  Filled 2016-10-03: qty 1

## 2016-10-03 MED ORDER — METOCLOPRAMIDE HCL 5 MG PO TABS: 5.0000 mg | ORAL_TABLET | Freq: Three times a day (TID) | ORAL | Status: DC | PRN

## 2016-10-03 MED ORDER — BUPIVACAINE HCL (PF) 0.25 % IJ SOLN
INTRAMUSCULAR | Status: AC
  Filled 2016-10-03: qty 30

## 2016-10-03 MED ORDER — PROPOFOL 10 MG/ML IV BOLUS
INTRAVENOUS | Status: DC | PRN
  Administered 2016-10-03: 50 mg via INTRAVENOUS

## 2016-10-03 MED ORDER — DIPHENHYDRAMINE HCL 12.5 MG/5ML PO ELIX: 12.5000 mg | ORAL_SOLUTION | ORAL | Status: DC | PRN

## 2016-10-03 MED ORDER — FERROUS SULFATE 325 (65 FE) MG PO TABS
325.0000 mg | ORAL_TABLET | Freq: Three times a day (TID) | ORAL | Status: DC
  Administered 2016-10-04 – 2016-10-05 (×5): 325 mg via ORAL
  Filled 2016-10-03 (×6): qty 1

## 2016-10-03 MED ORDER — SODIUM CHLORIDE 0.9 % IV SOLN
INTRAVENOUS | Status: DC
  Administered 2016-10-04: 07:00:00 via INTRAVENOUS

## 2016-10-03 MED ORDER — METHOCARBAMOL 500 MG PO TABS
500.0000 mg | ORAL_TABLET | Freq: Four times a day (QID) | ORAL | Status: DC | PRN
  Administered 2016-10-04 – 2016-10-06 (×3): 500 mg via ORAL
  Filled 2016-10-03 (×3): qty 1

## 2016-10-03 MED ORDER — CEFAZOLIN SODIUM-DEXTROSE 2-4 GM/100ML-% IV SOLN
2.0000 g | Freq: Four times a day (QID) | INTRAVENOUS | Status: AC
  Administered 2016-10-03 – 2016-10-04 (×2): 2 g via INTRAVENOUS
  Filled 2016-10-03 (×2): qty 100

## 2016-10-03 MED ORDER — BISACODYL 5 MG PO TBEC: 5.0000 mg | DELAYED_RELEASE_TABLET | Freq: Every day | ORAL | Status: DC | PRN

## 2016-10-03 MED ORDER — STERILE WATER FOR IRRIGATION IR SOLN
Status: DC | PRN
  Administered 2016-10-03: 2000 mL

## 2016-10-03 MED ORDER — FENTANYL CITRATE (PF) 100 MCG/2ML IJ SOLN
INTRAMUSCULAR | Status: AC
  Administered 2016-10-03: 75 ug via INTRAVENOUS
  Filled 2016-10-03: qty 2

## 2016-10-03 MED ORDER — ALBUMIN HUMAN 5 % IV SOLN
INTRAVENOUS | Status: AC
  Filled 2016-10-03: qty 250

## 2016-10-03 MED ORDER — GEMFIBROZIL 600 MG PO TABS
600.0000 mg | ORAL_TABLET | Freq: Two times a day (BID) | ORAL | Status: DC
Start: 2016-10-03 — End: 2016-10-06
  Administered 2016-10-04 – 2016-10-06 (×5): 600 mg via ORAL
  Filled 2016-10-03 (×7): qty 1

## 2016-10-03 MED ORDER — MIDAZOLAM HCL 5 MG/ML IJ SOLN
1.0000 mg | INTRAMUSCULAR | Status: DC | PRN
  Administered 2016-10-03: 1.5 mg via INTRAVENOUS

## 2016-10-03 MED ORDER — PROPOFOL 500 MG/50ML IV EMUL
INTRAVENOUS | Status: DC | PRN
  Administered 2016-10-03: 75 ug/kg/min via INTRAVENOUS

## 2016-10-03 MED ORDER — POLYETHYLENE GLYCOL 3350 17 G PO PACK
17.0000 g | PACK | Freq: Every day | ORAL | Status: DC | PRN
  Filled 2016-10-03: qty 1

## 2016-10-03 MED ORDER — MIDAZOLAM HCL 2 MG/2ML IJ SOLN
INTRAMUSCULAR | Status: AC
  Filled 2016-10-03: qty 2

## 2016-10-03 SURGICAL SUPPLY — 57 items
BAG DECANTER FOR FLEXI CONT (MISCELLANEOUS) IMPLANT
BAG ZIPLOCK 12X15 (MISCELLANEOUS) ×6 IMPLANT
BANDAGE ACE 4X5 VEL STRL LF (GAUZE/BANDAGES/DRESSINGS) ×3 IMPLANT
BANDAGE ACE 6X5 VEL STRL LF (GAUZE/BANDAGES/DRESSINGS) ×3 IMPLANT
BLADE SAG 18X100X1.27 (BLADE) ×3 IMPLANT
BLADE SAW SGTL 13.0X1.19X90.0M (BLADE) ×3 IMPLANT
BOWL SMART MIX CTS (DISPOSABLE) ×3 IMPLANT
CAP KNEE TOTAL 3 SIGMA ×3 IMPLANT
CEMENT HV SMART SET (Cement) ×6 IMPLANT
COVER SURGICAL LIGHT HANDLE (MISCELLANEOUS) ×3 IMPLANT
CUFF TOURN SGL QUICK 34 (TOURNIQUET CUFF) ×2
CUFF TRNQT CYL 34X4X40X1 (TOURNIQUET CUFF) ×1 IMPLANT
DECANTER SPIKE VIAL GLASS SM (MISCELLANEOUS) ×3 IMPLANT
DERMABOND ADVANCED (GAUZE/BANDAGES/DRESSINGS) ×2
DERMABOND ADVANCED .7 DNX12 (GAUZE/BANDAGES/DRESSINGS) ×1 IMPLANT
DRAPE U-SHAPE 47X51 STRL (DRAPES) ×3 IMPLANT
DRSG AQUACEL AG ADV 3.5X10 (GAUZE/BANDAGES/DRESSINGS) ×3 IMPLANT
DRSG TEGADERM 4X4.75 (GAUZE/BANDAGES/DRESSINGS) ×3 IMPLANT
DRSG TEGADERM 8X12 (GAUZE/BANDAGES/DRESSINGS) ×3 IMPLANT
DURAPREP 26ML APPLICATOR (WOUND CARE) ×6 IMPLANT
ELECT REM PT RETURN 15FT ADLT (MISCELLANEOUS) ×3 IMPLANT
EVACUATOR 1/8 PVC DRAIN (DRAIN) ×3 IMPLANT
GAUZE SPONGE 2X2 8PLY STRL LF (GAUZE/BANDAGES/DRESSINGS) ×1 IMPLANT
GLOVE BIOGEL PI IND STRL 8 (GLOVE) ×2 IMPLANT
GLOVE BIOGEL PI INDICATOR 8 (GLOVE) ×4
GLOVE ECLIPSE 8.0 STRL XLNG CF (GLOVE) ×6 IMPLANT
GLOVE SURG ORTHO 9.0 STRL STRW (GLOVE) ×3 IMPLANT
GLOVE SURG SS PI 7.5 STRL IVOR (GLOVE) ×9 IMPLANT
GOWN STRL REUS W/TWL XL LVL3 (GOWN DISPOSABLE) ×6 IMPLANT
HANDPIECE INTERPULSE COAX TIP (DISPOSABLE) ×2
IMMOBILIZER KNEE 20 (SOFTGOODS) ×3
IMMOBILIZER KNEE 20 THIGH 36 (SOFTGOODS) ×1 IMPLANT
NS IRRIG 1000ML POUR BTL (IV SOLUTION) ×3 IMPLANT
PACK TOTAL KNEE CUSTOM (KITS) ×3 IMPLANT
POSITIONER SURGICAL ARM (MISCELLANEOUS) ×3 IMPLANT
SET HNDPC FAN SPRY TIP SCT (DISPOSABLE) ×1 IMPLANT
SET PAD KNEE POSITIONER (MISCELLANEOUS) ×3 IMPLANT
SPONGE GAUZE 2X2 STER 10/PKG (GAUZE/BANDAGES/DRESSINGS) ×2
SPONGE LAP 18X18 X RAY DECT (DISPOSABLE) IMPLANT
SPONGE SURGIFOAM ABS GEL 100 (HEMOSTASIS) ×3 IMPLANT
STOCKINETTE 6  STRL (DRAPES) ×2
STOCKINETTE 6 STRL (DRAPES) ×1 IMPLANT
SUCTION FRAZIER HANDLE 12FR (TUBING) ×2
SUCTION TUBE FRAZIER 12FR DISP (TUBING) ×1 IMPLANT
SUT BONE WAX W31G (SUTURE) ×3 IMPLANT
SUT MNCRL AB 3-0 PS2 18 (SUTURE) ×3 IMPLANT
SUT VIC AB 1 CT1 27 (SUTURE) ×8
SUT VIC AB 1 CT1 27XBRD ANTBC (SUTURE) ×4 IMPLANT
SUT VIC AB 2-0 CT1 27 (SUTURE) ×4
SUT VIC AB 2-0 CT1 TAPERPNT 27 (SUTURE) ×2 IMPLANT
SUT VLOC 180 0 24IN GS25 (SUTURE) ×3 IMPLANT
SYR 50ML LL SCALE MARK (SYRINGE) IMPLANT
TAPE STRIPS DRAPE STRL (GAUZE/BANDAGES/DRESSINGS) ×3 IMPLANT
TRAY FOLEY W/METER SILVER 16FR (SET/KITS/TRAYS/PACK) ×3 IMPLANT
WATER STERILE IRR 1500ML POUR (IV SOLUTION) ×6 IMPLANT
WRAP KNEE MAXI GEL POST OP (GAUZE/BANDAGES/DRESSINGS) ×3 IMPLANT
YANKAUER SUCT BULB TIP 10FT TU (MISCELLANEOUS) ×3 IMPLANT

## 2016-10-03 NOTE — Anesthesia Procedure Notes (Signed)
Spinal  Patient location during procedure: OR Start time: 10/03/2016 3:08 PM End time: 10/03/2016 3:12 PM Reason for block: at surgeon's request Staffing Resident/CRNA: Anne Fu Performed: resident/CRNA  Preanesthetic Checklist Completed: patient identified, site marked, surgical consent, pre-op evaluation, timeout performed, IV checked, risks and benefits discussed and monitors and equipment checked Spinal Block Patient position: sitting Prep: DuraPrep Patient monitoring: heart rate, continuous pulse ox and blood pressure Approach: right paramedian Location: L2-3 Injection technique: single-shot Needle Needle type: Pencan  Needle gauge: 24 G Needle length: 9 cm Assessment Sensory level: T6 Additional Notes Expiration date of kit checked and confirmed. Patient tolerated procedure well, without complications. X 1 attempt with noted clear CSF return. Loss of motor and sensory on exam post injection.

## 2016-10-03 NOTE — Transfer of Care (Signed)
Immediate Anesthesia Transfer of Care Note  Patient: Dakota Thompson  Procedure(s) Performed: Procedure(s): RIGHT TOTAL KNEE ARTHROPLASTY (Right)  Patient Location: PACU  Anesthesia Type:Spinal  Level of Consciousness:  sedated, patient cooperative and responds to stimulation  Airway & Oxygen Therapy:Patient Spontanous Breathing and Patient connected to face mask oxgen  Post-op Assessment:  Report given to PACU RN and Post -op Vital signs reviewed and stable  Post vital signs:  Reviewed and stable  Last Vitals:  Vitals:   10/03/16 1448 10/03/16 1450  BP:    Pulse: 67 71  Resp: 13 18  Temp:      Complications: No apparent anesthesia complications

## 2016-10-03 NOTE — Anesthesia Postprocedure Evaluation (Signed)
Anesthesia Post Note  Patient: Dakota Thompson  Procedure(s) Performed: Procedure(s) (LRB): RIGHT TOTAL KNEE ARTHROPLASTY (Right)  Patient location during evaluation: PACU Anesthesia Type: Spinal Level of consciousness: awake Pain management: pain level controlled Vital Signs Assessment: post-procedure vital signs reviewed and stable Respiratory status: spontaneous breathing Cardiovascular status: stable Anesthetic complications: no       Last Vitals:  Vitals:   10/03/16 1713 10/03/16 1715  BP:  115/64  Pulse: 79 76  Resp: 11 20  Temp: 36.4 C     Last Pain:  Vitals:   10/03/16 1343  TempSrc:   PainSc: 0-No pain                 Fintan Grater

## 2016-10-03 NOTE — Interval H&P Note (Signed)
History and Physical Interval Note:  10/03/2016 12:14 PM  Dakota Thompson  has presented today for surgery, with the diagnosis of Right knee osteoarthritis  The various methods of treatment have been discussed with the patient and family. After consideration of risks, benefits and other options for treatment, the patient has consented to  Procedure(s): RIGHT TOTAL KNEE ARTHROPLASTY (Right) as a surgical intervention .  The patient's history has been reviewed, patient examined, no change in status, stable for surgery.  I have reviewed the patient's chart and labs.  Questions were answered to the patient's satisfaction.     Camauri Fleece ANDREW

## 2016-10-03 NOTE — Anesthesia Procedure Notes (Signed)
Anesthesia Regional Block: Adductor canal block   Pre-Anesthetic Checklist: ,, timeout performed,, Correct Site,,,, site marked,,,,,,  Laterality: Right  Prep: chloraprep       Needles:   Needle Type: Stimulator Needle - 80          Additional Needles:   Procedures: Doppler guided, Ultrasound guided,,,,,,  Narrative:  Start time: 10/03/2016 2:10 PM End time: 10/03/2016 2:25 PM Injection made incrementally with aspirations every 5 mL.  Performed by: Personally  Anesthesiologist: Belinda Block

## 2016-10-03 NOTE — Interval H&P Note (Signed)
History and Physical Interval Note:  10/03/2016 2:09 PM  Dakota Thompson  has presented today for surgery, with the diagnosis of Right knee osteoarthritis  The various methods of treatment have been discussed with the patient and family. After consideration of risks, benefits and other options for treatment, the patient has consented to  Procedure(s): RIGHT TOTAL KNEE ARTHROPLASTY (Right) as a surgical intervention .  The patient's history has been reviewed, patient examined, no change in status, stable for surgery.  I have reviewed the patient's chart and labs.  Questions were answered to the patient's satisfaction.     Aron Inge ANDREW

## 2016-10-03 NOTE — Progress Notes (Signed)
Assisted Dr Greenwith right, ultrasound guided, adductor canal block. Side rails up, monitors on throughout procedure. See vital signs in flow sheet. Tolerated Procedure well.  

## 2016-10-03 NOTE — Op Note (Signed)
DATE OF SURGERY:  10/03/2016  TIME: 4:53 PM  PATIENT NAME:  Dakota Thompson    AGE: 71 y.o.   PRE-OPERATIVE DIAGNOSIS:  Right knee osteoarthritis  POST-OPERATIVE DIAGNOSIS:  Right knee osteoarthritis  PROCEDURE:  Procedure(s): RIGHT TOTAL KNEE ARTHROPLASTY  SURGEON:  Jacki Couse ANDREW  ASSISTANT:  Bryson Stilwell, PA-C, present and scrubbed throughout the case, critical for assistance with exposure, retraction, instrumentation, and closure.  OPERATIVE IMPLANTS: Depuy PFC Sigma Rotating Platform.  Femur size 5, Tibia size 5, Patella size 41 3-peg oval button, with a 10 mm polyethylene insert.   PREOPERATIVE INDICATIONS:   Dakota Thompson is a 71 y.o. year old male with end stage bone on bone arthritis of the knee who failed conservative treatment and elected for Total Knee Arthroplasty.   The risks, benefits, and alternatives were discussed at length including but not limited to the risks of infection, bleeding, nerve injury, stiffness, blood clots, the need for revision surgery, cardiopulmonary complications, among others, and they were willing to proceed.  OPERATIVE DESCRIPTION:  The patient was brought to the operative room and placed in a supine position.  Spinal anesthesia was administered.  IV antibiotics were given.  The lower extremity was prepped and draped in the usual sterile fashion.  Time out was performed.  The leg was elevated and exsanguinated and the tourniquet was inflated.  Anterior quadriceps tendon splitting approach was performed.  The patella was retracted and osteophytes were removed.  The anterior horn of the medial and lateral meniscus was removed and cruciate ligaments resected.   The distal femur was opened with the drill and the intramedullary distal femoral cutting jig was utilized, set at 5 degrees resecting 10 mm off the distal femur.  Care was taken to protect the collateral ligaments.  The distal femoral sizing jig was applied, taking care to avoid  notching.  Then the 4-in-1 cutting jig was applied and the anterior and posterior femur was cut, along with the chamfer cuts.    Then the extramedullary tibial cutting jig was utilized making the appropriate cut using the anterior tibial crest as a reference building in appropriate posterior slope.  Care was taken during the cut to protect the medial and collateral ligaments.  The proximal tibia was removed along with the posterior horns of the menisci.   The posterior medial femoral osteophytes and posterior lateral femoral osteophytes were removed.    The flexion gap was then measured and was symmetric with the extension gap, measured at 10.  I completed the distal femoral preparation using the appropriate jig to prepare the box.  The patella was then measured, and cut with the saw.    The proximal tibia sized and prepared accordingly with the reamer and the punch, and then all components were trialed with the trial insert.  The knee was found to have excellent balance and full motion.    The above named components were then cemented into place and all excess cement was removed.  The trial polyethylene component was in place during cementation, and then was exchanged for the real polyethylene component.    The knee was easily taken through a range of motion and the patella tracked well and the knee irrigated copiously and the parapatellar and subcutaneous tissue closed with vicryl, and monocryl with steri strips for the skin.  The arthrotomy was closed at 90 of flexion. The wounds were dressed with sterile gauze and the tourniquet released and the patient was awakened and returned to the PACU  in stable and satisfactory condition.  There were no complications.  Total tourniquet time was 80 minutes.

## 2016-10-03 NOTE — Anesthesia Preprocedure Evaluation (Signed)
Anesthesia Evaluation  Patient identified by MRN, date of birth, ID band Patient awake    Reviewed: Allergy & Precautions, NPO status , Patient's Chart, lab work & pertinent test results  Airway Mallampati: II  TM Distance: >3 FB     Dental   Pulmonary sleep apnea , former smoker,    breath sounds clear to auscultation       Cardiovascular hypertension,  Rhythm:Regular Rate:Normal     Neuro/Psych Anxiety  Neuromuscular disease    GI/Hepatic hiatal hernia, GERD  ,  Endo/Other    Renal/GU      Musculoskeletal   Abdominal   Peds  Hematology  (+) anemia ,   Anesthesia Other Findings   Reproductive/Obstetrics                             Anesthesia Physical Anesthesia Plan  ASA: III  Anesthesia Plan: Spinal   Post-op Pain Management:  Regional for Post-op pain   Induction: Intravenous  Airway Management Planned:   Additional Equipment:   Intra-op Plan:   Post-operative Plan:   Informed Consent: I have reviewed the patients History and Physical, chart, labs and discussed the procedure including the risks, benefits and alternatives for the proposed anesthesia with the patient or authorized representative who has indicated his/her understanding and acceptance.   Dental advisory given  Plan Discussed with: CRNA and Anesthesiologist  Anesthesia Plan Comments:         Anesthesia Quick Evaluation

## 2016-10-04 LAB — BASIC METABOLIC PANEL
Anion gap: 8 (ref 5–15)
BUN: 31 mg/dL — ABNORMAL HIGH (ref 6–20)
CALCIUM: 9.2 mg/dL (ref 8.9–10.3)
CO2: 25 mmol/L (ref 22–32)
CREATININE: 1.56 mg/dL — AB (ref 0.61–1.24)
Chloride: 104 mmol/L (ref 101–111)
GFR calc non Af Amer: 44 mL/min — ABNORMAL LOW (ref >60)
GFR, EST AFRICAN AMERICAN: 51 mL/min — AB (ref >60)
Glucose, Bld: 137 mg/dL — ABNORMAL HIGH (ref 65–99)
Potassium: 5 mmol/L (ref 3.5–5.1)
Sodium: 137 mmol/L (ref 135–145)

## 2016-10-04 LAB — CBC
HEMATOCRIT: 36.5 % — AB (ref 39.0–52.0)
Hemoglobin: 12 g/dL — ABNORMAL LOW (ref 13.0–17.0)
MCH: 28.1 pg (ref 26.0–34.0)
MCHC: 32.9 g/dL (ref 30.0–36.0)
MCV: 85.5 fL (ref 78.0–100.0)
PLATELETS: 208 10*3/uL (ref 150–400)
RBC: 4.27 MIL/uL (ref 4.22–5.81)
RDW: 14.2 % (ref 11.5–15.5)
WBC: 11.6 10*3/uL — ABNORMAL HIGH (ref 4.0–10.5)

## 2016-10-04 MED ORDER — OXYCODONE HCL 5 MG PO TABS
5.0000 mg | ORAL_TABLET | ORAL | Status: DC | PRN
  Administered 2016-10-04: 15 mg via ORAL
  Administered 2016-10-04: 10 mg via ORAL
  Administered 2016-10-04 (×2): 15 mg via ORAL
  Administered 2016-10-05: 10 mg via ORAL
  Administered 2016-10-05: 15 mg via ORAL
  Administered 2016-10-05: 10 mg via ORAL
  Administered 2016-10-05 (×2): 15 mg via ORAL
  Administered 2016-10-06 (×2): 5 mg via ORAL
  Filled 2016-10-04 (×3): qty 2
  Filled 2016-10-04 (×2): qty 3
  Filled 2016-10-04: qty 2
  Filled 2016-10-04 (×2): qty 1
  Filled 2016-10-04 (×3): qty 3
  Filled 2016-10-04: qty 2

## 2016-10-04 MED ORDER — SODIUM CHLORIDE 0.9 % IV BOLUS (SEPSIS)
250.0000 mL | Freq: Once | INTRAVENOUS | Status: AC
  Administered 2016-10-04: 250 mL via INTRAVENOUS

## 2016-10-04 NOTE — Evaluation (Signed)
Physical Therapy Evaluation Patient Details Name: AMMAR MOFFATT MRN: [id number] DOB: 09/05/1945 Today's Date: 10/04/2016   History of Present Illness  71 y.o. male now s/p Rt TKA. PMH: restless leg syndrome, Charcot-marie tooth, anxiety  Clinical Impression  Pt is s/p TKA resulting in the deficits listed below (see PT Problem List). Pt able to ambulate 35 ft with rw during initial PT session. Cues needed for sequence initially but decreased by end of session. Anticipate pt will D/C to home following acute stay with family support. Pt will benefit from skilled PT to increase their independence and safety with mobility.      Follow Up Recommendations Home health PT;Supervision for mobility/OOB    Equipment Recommendations  Rolling walker with 5" wheels    Recommendations for Other Services       Precautions / Restrictions Precautions Precautions: Knee Precaution Booklet Issued: Yes (comment) Precaution Comments: HEP provided, reviewed knee extension precautions.  Required Braces or Orthoses: Knee Immobilizer - Right Restrictions Weight Bearing Restrictions: Yes RLE Weight Bearing: Weight bearing as tolerated      Mobility  Bed Mobility Overal bed mobility: Needs Assistance Bed Mobility: Supine to Sit     Supine to sit: Supervision     General bed mobility comments: using rails to come to sitting  Transfers Overall transfer level: Needs assistance Equipment used: Rolling walker (2 wheeled) Transfers: Sit to/from Stand Sit to Stand: Min guard         General transfer comment: cues for hand placement  Ambulation/Gait Ambulation/Gait assistance: Min guard Ambulation Distance (Feet): 35 Feet Assistive device: Rolling walker (2 wheeled) Gait Pattern/deviations: Step-to pattern;Decreased stance time - right Gait velocity: decreased   General Gait Details: cues for weightbearing, using KI  Stairs            Wheelchair Mobility    Modified [name] (Stroke  Patients Only)       Balance Overall balance assessment: Needs assistance Sitting-balance support: No upper extremity supported Sitting balance-Leahy Scale: Good     Standing balance support: Bilateral upper extremity supported Standing balance-Leahy Scale: Poor Standing balance comment: using rw for support                             Pertinent Vitals/Pain Pain Assessment: 0-10 Pain Score: 3  Pain Location: Rt knee Pain Descriptors / Indicators: Aching Pain Intervention(s): Limited activity within patient's tolerance;Monitored during session;Ice applied    Home Living Family/patient expects to be discharged to:: Private residence Living Arrangements: Spouse/significant other Available Help at Discharge: Family;Available 24 hours/day Type of Home: House Home Access: Stairs to enter Entrance Stairs-Rails: Psychiatric nurse of Steps: 4-5 Home Layout: One level Home Equipment: Bedside commode Additional Comments: spouse works, daughter will be staying with pt during the day.     Prior Function Level of Independence: Independent         Comments: work for Programmer, systems.      Hand Dominance        Extremity/Trunk Assessment   Upper Extremity Assessment Upper Extremity Assessment: Overall WFL for tasks assessed    Lower Extremity Assessment Lower Extremity Assessment: RLE deficits/detail RLE Deficits / Details: able to perform SLR       Communication   Communication: HOH (wears hearing aids)  Cognition Arousal/Alertness: Awake/alert Behavior During Therapy: WFL for tasks assessed/performed Overall Cognitive Status: Within Functional Limits for tasks assessed  General Comments      Exercises     Assessment/Plan    PT Assessment Patient needs continued PT services  PT Problem List Decreased strength;Decreased range of motion;Decreased activity tolerance;Decreased  balance;Decreased mobility       PT Treatment Interventions DME instruction;Gait training;Functional mobility training;Stair training;Therapeutic activities;Therapeutic exercise;Patient/family education    PT Goals (Current goals can be found in the Care Plan section)  Acute Rehab PT Goals 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 PT Goal Formulation: With patient Time For Goal Achievement: 10/18/16 Potential to Achieve Goals: Good    Frequency 7X/week   Barriers to discharge        Co-evaluation               End of Session Equipment Utilized During Treatment: Gait belt;Right knee immobilizer Activity Tolerance: Patient tolerated treatment well Patient left: in chair;with call bell/phone within reach;with family/visitor present (in knee extension) Nurse Communication: Mobility status PT Visit Diagnosis: Unsteadiness on feet (R26.81);Muscle weakness (generalized) (M62.81)    Time: 1122-1203 PT Time Calculation (min) (ACUTE ONLY): 41 min   Charges:   PT Evaluation $PT Eval Moderate Complexity: 1 Procedure PT Treatments $Gait Training: 8-22 mins $Therapeutic Activity: 8-22 mins   PT G Codes:        Cassell Clement, PT, CSCS Pager [phone] Office [place] 10/04/2016, 12:24 PM

## 2016-10-04 NOTE — Evaluation (Signed)
Occupational Therapy Evaluation Patient Details Name: Dakota Thompson MRN: [id number] DOB: 1945-07-27 Today's Date: 10/04/2016    History of Present Illness 71 y.o. male now s/p Rt TKA. PMH: restless leg syndrome, Charcot-marie tooth, anxiety   Clinical Impression   Pt up with OT to practice functional commode transfers with walker. Educated on walker safety with ADL also. Wife present for session. Pt will benefit from continued OT to progress independence and safety with self care tasks.     Follow Up Recommendations  No OT follow up;Supervision/Assistance - 24 hour    Equipment Recommendations  None recommended by OT    Recommendations for Other Services       Precautions / Restrictions Precautions Precautions: Knee Precaution Booklet Issued: Yes (comment) Precaution Comments: HEP provided, reviewed knee extension precautions.  Required Braces or Orthoses: Knee Immobilizer - Right Restrictions Weight Bearing Restrictions: No RLE Weight Bearing: Weight bearing as tolerated      Mobility Bed Mobility      General bed mobility comments: in chair.   Transfers Overall transfer level: Needs assistance Equipment used: Rolling walker (2 wheeled) Transfers: Sit to/from Stand Sit to Stand: Min guard         General transfer comment: cues for hand placement and LE management.    Balance Overall balance assessment: Needs assistance Sitting-balance support: No upper extremity supported Sitting balance-Leahy Scale: Good     Standing balance support: Bilateral upper extremity supported Standing balance-Leahy Scale: Poor Standing balance comment: using rw for support                           ADL either performed or assessed with clinical judgement   ADL Overall ADL's : Needs assistance/impaired Eating/Feeding: Independent;Sitting   Grooming: Wash/dry hands;Set up;Sitting   Upper Body Bathing: Set up;Sitting   Lower Body Bathing: Minimal assistance;Sit  to/from stand   Upper Body Dressing : Set up;Sitting   Lower Body Dressing: Moderate assistance;Sit to/from stand   Toilet Transfer: Minimal assistance;Ambulation;RW;BSC   Toileting- Clothing Manipulation and Hygiene: Minimal assistance;Sit to/from stand         General ADL Comments: wife present for session. Pt has a tub seat his wife used for her knee surgery but pt states he will likely sponge bathe initially and let HH assess tub transfer once he feels ready. Discussed sequence for LB dressing and use of KI. Demonstrated all AE but pt does pretty well reaching to R foot-needs min assist to fully don R sock. He states his family can help with LB dressing until he is able but he is interested in a reacher and states he will likely obtain this. Did educate pt and wife on safe technique for tub transfers.     Vision Patient Visual Report: No change from baseline       Perception     Praxis      Pertinent Vitals/Pain Pain Assessment: 0-10 Pain Score: 6  Pain Location: R knee Pain Descriptors / Indicators: Aching Pain Intervention(s): Ice applied;Monitored during session     Hand Dominance     Extremity/Trunk Assessment Upper Extremity Assessment Upper Extremity Assessment: Overall WFL for tasks assessed          Communication Communication Communication: HOH (wears hearing aids)   Cognition Arousal/Alertness: Awake/alert Behavior During Therapy: WFL for tasks assessed/performed Overall Cognitive Status: Within Functional Limits for tasks assessed  General Comments       Exercises     Shoulder Instructions      Home Living Family/patient expects to be discharged to:: Private residence Living Arrangements: Spouse/significant other Available Help at Discharge: Family;Available 24 hours/day Type of Home: House Home Access: Stairs to enter [employer] of Steps: 4-5 Entrance Stairs-Rails:  Right;Left Home Layout: One level     Bathroom Shower/Tub: Teacher, early years/pre: Standard     Home Equipment: Bedside commode;Shower seat   Additional Comments: spouse works, daughter will be staying with pt during the day.       Prior Functioning/Environment Level of Independence: Independent        Comments: work for Programmer, systems.         OT Problem List: Decreased strength;Decreased knowledge of use of DME or AE      OT Treatment/Interventions: Self-care/ADL training;Patient/family education;DME and/or AE instruction    OT Goals(Current goals can be found in the care plan section) Acute Rehab OT Goals Patient Stated Goal: return to independence OT Goal Formulation: With patient Time For Goal Achievement: 10/11/16 Potential to Achieve Goals: Good  OT Frequency: Min 2X/week   Barriers to D/C:            Co-evaluation              End of Session Equipment Utilized During Treatment: Rolling walker;Right knee immobilizer  Activity Tolerance: Patient tolerated treatment well Patient left: in chair;with call bell/phone within reach;with family/visitor present  OT Visit Diagnosis: Unsteadiness on feet (R26.81);Hemiplegia and hemiparesis;Muscle weakness (generalized) (M62.81)                Time: 9892-1194 OT Time Calculation (min): 37 min Charges:  OT General Charges $OT Visit: 1 Procedure OT Evaluation $OT Eval Low Complexity: 1 Procedure OT Treatments $Therapeutic Activity: 8-22 mins G-Codes:       Jae Dire Jaquasia Doscher 10/04/2016, 3:25 PM  [phone]

## 2016-10-04 NOTE — Progress Notes (Signed)
   Subjective: 1 Day Post-Op Procedure(s) (LRB): RIGHT TOTAL KNEE ARTHROPLASTY (Right) Patient reports pain as moderate.   Patient seen in rounds for Dr. Theda Sers. Patient is well, but has had some minor complaints of pain in the knee, requiring pain medications We will start therapy today.  Plan is to go Home after hospital stay.  Objective: Vital signs in last 24 hours: Temp:  [97.4 F (36.3 C)-98.7 F (37.1 C)] 98.2 F (36.8 C) (04/14 0521) Pulse Rate:  [55-80] 66 (04/14 0521) Resp:  [11-32] 16 (04/14 0521) BP: (105-141)/(57-80) 105/57 (04/14 0521) SpO2:  [95 %-100 %] 97 % (04/14 0521) Weight:  [115.7 kg (255 lb)] 115.7 kg (255 lb) (04/13 1242)  Intake/Output from previous day:  Intake/Output Summary (Last 24 hours) at 10/04/16 0726 Last data filed at 10/04/16 0600  Gross per 24 hour  Intake             3080 ml  Output             2040 ml  Net             1040 ml    Intake/Output this shift: No intake/output data recorded.  Labs:  Recent Labs  10/04/16 0456  HGB 12.0*    Recent Labs  10/04/16 0456  WBC 11.6*  RBC 4.27  HCT 36.5*  PLT 208    Recent Labs  10/04/16 0456  NA 137  K 5.0  CL 104  CO2 25  BUN 31*  CREATININE 1.56*  GLUCOSE 137*  CALCIUM 9.2   No results for input(s): LABPT, INR in the last 72 hours.  EXAM General - Patient is Alert, Appropriate and Oriented Extremity - Neurovascular intact Sensation intact distally Intact pulses distally Dorsiflexion/Plantar flexion intact Dressing - dressing C/D/I Motor Function - intact, moving foot and toes well on exam.  Hemovac pulled without difficulty.  Past Medical History:  Diagnosis Date  . Anxiety   . 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 GERD (gastroesophageal reflux disease)   . Hx of adenomatous colonic polyps    tubulovillous adenoma  . Hypertension   . Pancytopenia    mild  . Pancytopenia 02/27/2011  . Restless leg  syndrome   . Sleep apnea    wears CPAP/BIPAP every night  . Thyroid disease   . Viral syndrome    Hx  . Viral syndrome 02/27/2011    Assessment/Plan: 1 Day Post-Op Procedure(s) (LRB): RIGHT TOTAL KNEE ARTHROPLASTY (Right) Active Problems:   S/P knee replacement  Estimated body mass index is 37.66 kg/m as calculated from the following:   Height as of this encounter: 5\' 9"  (1.753 m).   Weight as of this encounter: 115.7 kg (255 lb). Up with therapy Plan for discharge tomorrow  DVT Prophylaxis - Lovenox Weight-Bearing as tolerated to right leg D/C O2 and Pulse OX and try on Room Air  Arlee Muslim, PA-C Orthopaedic Surgery 10/04/2016, 7:26 AM

## 2016-10-04 NOTE — Progress Notes (Signed)
Physical Therapy Treatment Patient Details Name: Dakota Thompson MRN: [id number] DOB: [birth year] Today's Date: 10/04/2016    History of Present Illness 71 y.o. male now s/p Rt TKA. PMH: restless leg syndrome, Charcot-marie tooth, anxiety    PT Comments    Pt making good progress during second PT session, ambulating 60 ft with rw. Pt hoping to return home with family support tomorrow. PT to continue to follow.    Follow Up Recommendations  Home health PT;Supervision for mobility/OOB     Equipment Recommendations  Rolling walker with 5" wheels    Recommendations for Other Services       Precautions / Restrictions Precautions Precautions: Knee Precaution Booklet Issued: Yes (comment) Precaution Comments: HEP provided, reviewed knee extension precautions.  Required Braces or Orthoses: Knee Immobilizer - Right Restrictions Weight Bearing Restrictions: Yes RLE Weight Bearing: Weight bearing as tolerated    Mobility  Bed Mobility Overal bed mobility: Needs Assistance    Supine to sit: Supervision Sit to supine: Min guard   General bed mobility comments: in chair.   Transfers Overall transfer level: Needs assistance Equipment used: Rolling walker (2 wheeled) Transfers: Sit to/from Stand Sit to Stand: Min guard         General transfer comment: cues for hand placement and LE management.  Ambulation/Gait Ambulation/Gait assistance: Min guard Ambulation Distance (Feet): 60 Feet Assistive device: Rolling walker (2 wheeled) Gait Pattern/deviations: Step-to pattern Gait velocity: decreased   General Gait Details: cues for weightbearing, using KI   Stairs            Wheelchair Mobility    Modified [name] (Stroke Patients Only)       Balance Overall balance assessment: Needs assistance Sitting-balance support: No upper extremity supported Sitting balance-Leahy Scale: Good     Standing balance support: Bilateral upper extremity supported Standing  balance-Leahy Scale: Poor Standing balance comment: using rw for support                            Cognition Arousal/Alertness: Awake/alert Behavior During Therapy: WFL for tasks assessed/performed Overall Cognitive Status: Within Functional Limits for tasks assessed                                        Exercises Total Joint Exercises Ankle Circles/Pumps: AROM;Both;20 reps Quad Sets: Strengthening;Right;10 reps Heel Slides: AAROM;Right;10 reps Straight Leg Raises: Strengthening;Right;5 reps Goniometric ROM: 60 degrees knee flexion    General Comments        Pertinent Vitals/Pain Pain Assessment: 0-10 Pain Score: 5  Pain Location: Rt knee Pain Descriptors / Indicators: Aching Pain Intervention(s): Limited activity within patient's tolerance;Monitored during session;Ice applied    Home Living     Prior Function    PT Goals (current goals can now be found in the care plan section) Acute Rehab PT Goals Patient Stated Goal: get better PT Goal Formulation: With patient Time For Goal Achievement: 10/18/16 Potential to Achieve Goals: Good Progress towards PT goals: Progressing toward goals    Frequency    7X/week      PT Plan Current plan remains appropriate    Co-evaluation             End of Session Equipment Utilized During Treatment: Gait belt;Right knee immobilizer Activity Tolerance: Patient tolerated treatment well Patient left: in bed;with call bell/phone within reach;with family/visitor present;with SCD's reapplied (in  knee ext. ) Nurse Communication: Mobility status PT Visit Diagnosis: Unsteadiness on feet (R26.81);Muscle weakness (generalized) (M62.81)     Time: 0110-0349 PT Time Calculation (min) (ACUTE ONLY): 30 min  Charges:  $Gait Training: 8-22 mins $Therapeutic Exercise: 8-22 mins                    G Codes:       Cassell Clement, PT, CSCS Pager [phone] Office [place] 10/04/2016, 3:51 PM

## 2016-10-04 NOTE — Progress Notes (Signed)
Pt states that he will self administer BIPAP when ready for bed.  RT to monitor and assess as needed.  

## 2016-10-05 ENCOUNTER — Inpatient Hospital Stay (HOSPITAL_COMMUNITY): Payer: Medicare Other

## 2016-10-05 LAB — URINALYSIS, ROUTINE W REFLEX MICROSCOPIC
Bilirubin Urine: NEGATIVE
Glucose, UA: NEGATIVE mg/dL
Hgb urine dipstick: NEGATIVE
Ketones, ur: NEGATIVE mg/dL
Leukocytes, UA: NEGATIVE
Nitrite: NEGATIVE
Protein, ur: NEGATIVE mg/dL
Specific Gravity, Urine: 1.02 (ref 1.005–1.030)
pH: 5 (ref 5.0–8.0)

## 2016-10-05 LAB — CBC
HCT: 31.7 % — ABNORMAL LOW (ref 39.0–52.0)
HEMOGLOBIN: 11 g/dL — AB (ref 13.0–17.0)
MCH: 30.4 pg (ref 26.0–34.0)
MCHC: 34.7 g/dL (ref 30.0–36.0)
MCV: 87.6 fL (ref 78.0–100.0)
PLATELETS: 163 10*3/uL (ref 150–400)
RBC: 3.62 MIL/uL — ABNORMAL LOW (ref 4.22–5.81)
RDW: 14.5 % (ref 11.5–15.5)
WBC: 8.9 10*3/uL (ref 4.0–10.5)

## 2016-10-05 NOTE — Progress Notes (Signed)
Physical Therapy Treatment Patient Details Name: Dakota Thompson MRN: [id number] DOB: 1945/11/17 Today's Date: 10/05/2016    History of Present Illness 71 y.o. male now s/p Rt TKA. PMH: restless leg syndrome, Charcot-marie tooth, anxiety    PT Comments    Pt making good progress with mobility, ambulating 90 ft with rw and reviewing HEP with pt and daughter. Pt may D/C to home later today if mobilizing well and medically ready. Will attempt stairs at next session.    Follow Up Recommendations  Home health PT;Supervision for mobility/OOB     Equipment Recommendations  Rolling walker with 5" wheels    Recommendations for Other Services       Precautions / Restrictions Precautions Precautions: Knee Required Braces or Orthoses: Knee Immobilizer - Right Restrictions Weight Bearing Restrictions: Yes RLE Weight Bearing: Weight bearing as tolerated    Mobility  Bed Mobility Overal bed mobility: Needs Assistance Bed Mobility: Supine to Sit     Supine to sit: Supervision        Transfers Overall transfer level: Needs assistance Equipment used: Rolling walker (2 wheeled) Transfers: Sit to/from Stand Sit to Stand: Supervision         General transfer comment: cues for hand placement  Ambulation/Gait Ambulation/Gait assistance: Min guard Ambulation Distance (Feet): 90 Feet Assistive device: Rolling walker (2 wheeled) Gait Pattern/deviations: Step-to pattern;Step-through pattern Gait velocity: decreased   General Gait Details: working on transition to step-through pattern    Financial trader [name] (Stroke Patients Only)       Balance Overall balance assessment: Needs assistance Sitting-balance support: No upper extremity supported Sitting balance-Leahy Scale: Good     Standing balance support: During functional activity Standing balance-Leahy Scale: Fair Standing balance comment: able to stand static without UE  support, using rw for ambulation                            Cognition Arousal/Alertness: Awake/alert Behavior During Therapy: WFL for tasks assessed/performed Overall Cognitive Status: Within Functional Limits for tasks assessed                                        Exercises Total Joint Exercises Ankle Circles/Pumps: AROM;Both;20 reps Quad Sets: Strengthening;Right;10 reps Short Arc Quad: Strengthening;Right;10 reps Heel Slides: AAROM;Right;10 reps Straight Leg Raises: Strengthening;Right;10 reps Knee Flexion: AROM;Right;5 reps Goniometric ROM: 60 degrees knee flexion    General Comments        Pertinent Vitals/Pain Pain Assessment: 0-10 Pain Score: 4  Pain Location: Rt knee Pain Descriptors / Indicators: Aching Pain Intervention(s): Limited activity within patient's tolerance;Monitored during session;Ice applied;RN gave pain meds during session    Home Living                      Prior Function            PT Goals (current goals can now be found in the care plan section) Acute Rehab PT Goals Patient Stated Goal: get better PT Goal Formulation: With patient Time For Goal Achievement: 10/18/16 Potential to Achieve Goals: Good Progress towards PT goals: Progressing toward goals    Frequency    7X/week      PT Plan Current plan remains appropriate    Co-evaluation  End of Session Equipment Utilized During Treatment: Gait belt;Right knee immobilizer Activity Tolerance: Patient tolerated treatment well Patient left: in chair;with call bell/phone within reach;with family/visitor present Nurse Communication: Mobility status PT Visit Diagnosis: Unsteadiness on feet (R26.81);Muscle weakness (generalized) (M62.81)     Time: 8921-1941 PT Time Calculation (min) (ACUTE ONLY): 51 min  Charges:  $Gait Training: 8-22 mins $Therapeutic Exercise: 23-37 mins                    G Codes:       Cassell Clement, PT, CSCS Pager [phone] Office [phone] 10/05/2016, 9:14 AM

## 2016-10-05 NOTE — Progress Notes (Signed)
Physical Therapy Treatment Patient Details Name: Dakota Thompson MRN: [id number] DOB: 21-Nov-1945 Today's Date: 10/05/2016    History of Present Illness 71 y.o. male now s/p Rt TKA. PMH: restless leg syndrome, Charcot-marie tooth, anxiety    PT Comments    Pt with significantly decreased stability during second PT session. Pt ambulating in room with multiple losses of balance with physical assist to recover. Pt reports not feeling right, like he is sick. Nursing notified. Based upon instability, unable to safely attempt stairs. PT goals not met at this time. Will continue to follow to progress mobility as tolerated.   Follow Up Recommendations  Home health PT;Supervision for mobility/OOB     Equipment Recommendations  Rolling walker with 5" wheels    Recommendations for Other Services       Precautions / Restrictions Precautions Precautions: Knee Restrictions Weight Bearing Restrictions: Yes RLE Weight Bearing: Weight bearing as tolerated    Mobility  Bed Mobility Overal bed mobility: Needs Assistance Bed Mobility: Supine to Sit     Supine to sit: Supervision     General bed mobility comments: HOB elevated apporx. 20 degrees, using rail to assist  Transfers Overall transfer level: Needs assistance Equipment used: Rolling walker (2 wheeled) Transfers: Sit to/from Stand Sit to Stand: Min guard         General transfer comment: cues for hand placement  Ambulation/Gait Ambulation/Gait assistance: Min guard;Min assist Ambulation Distance (Feet): 25 Feet Assistive device: Rolling walker (2 wheeled) Gait Pattern/deviations: Step-through pattern;Decreased stride length;Decreased weight shift to right Gait velocity: decreased   General Gait Details: Pt with unstable gait, LOB X4 with assist to recover. Bringing chair closer due to instability. Unable to safety attempt stairs.    Stairs            Wheelchair Mobility    Modified [name] (Stroke Patients  Only)       Balance Overall balance assessment: Needs assistance Sitting-balance support: No upper extremity supported Sitting balance-Leahy Scale: Good     Standing balance support: Bilateral upper extremity supported Standing balance-Leahy Scale: Poor Standing balance comment: multiple losses of balance                            Cognition Arousal/Alertness: Awake/alert Behavior During Therapy: WFL for tasks assessed/performed Overall Cognitive Status: Within Functional Limits for tasks assessed                                        Exercises      General Comments        Pertinent Vitals/Pain Pain Assessment: Faces Faces Pain Scale: Hurts little more Pain Location: Rt knee Pain Descriptors / Indicators: Grimacing;Guarding Pain Intervention(s): Limited activity within patient's tolerance;Monitored during session;RN gave pain meds during session    Home Living                      Prior Function            PT Goals (current goals can now be found in the care plan section) Acute Rehab PT Goals Patient Stated Goal: feel better PT Goal Formulation: With patient Time For Goal Achievement: 10/18/16 Potential to Achieve Goals: Good Progress towards PT goals: Progressing toward goals    Frequency    7X/week      PT Plan Current plan remains appropriate  Co-evaluation             End of Session Equipment Utilized During Treatment: Gait belt;Right knee immobilizer Activity Tolerance:  (limited by pt not feeling well, instability) Patient left: in chair;with call bell/phone within reach;with family/visitor present Nurse Communication: Mobility status PT Visit Diagnosis: Unsteadiness on feet (R26.81);Muscle weakness (generalized) (M62.81)     Time: 9371-6967 PT Time Calculation (min) (ACUTE ONLY): 19 min  Charges:  $Gait Training: 8-22 mins                    G Codes:       Cassell Clement, PT,  CSCS Pager [phone] Office [place] 10/05/2016, 1:26 PM

## 2016-10-05 NOTE — Progress Notes (Signed)
   Subjective: 2 Days Post-Op Procedure(s) (LRB): RIGHT TOTAL KNEE ARTHROPLASTY (Right) Patient reports pain as mild and moderate.   Patient seen in rounds for Dr. Theda Sers. Ran elevated temp last night. CXR results pending, UA looks good. Patient is well, but has had some minor complaints of pain in the knee, requiring pain medications  Objective: Vital signs in last 24 hours: Temp:  [98.9 F (37.2 C)-102.4 F (39.1 C)] 100 F (37.8 C) (04/15 0538) Pulse Rate:  [78-89] 87 (04/15 0538) Resp:  [15-22] 20 (04/15 0538) BP: (107-138)/(48-67) 110/52 (04/15 0538) SpO2:  [92 %-98 %] 98 % (04/15 0538)  Intake/Output from previous day:  Intake/Output Summary (Last 24 hours) at 10/05/16 0732 Last data filed at 10/05/16 0538  Gross per 24 hour  Intake          1820.08 ml  Output             1025 ml  Net           795.08 ml    Intake/Output this shift: No intake/output data recorded.  Labs:  Recent Labs  10/04/16 0456 10/05/16 0419  HGB 12.0* 11.0*    Recent Labs  10/04/16 0456 10/05/16 0419  WBC 11.6* 8.9  RBC 4.27 3.62*  HCT 36.5* 31.7*  PLT 208 163    Recent Labs  10/04/16 0456  NA 137  K 5.0  CL 104  CO2 25  BUN 31*  CREATININE 1.56*  GLUCOSE 137*  CALCIUM 9.2   No results for input(s): LABPT, INR in the last 72 hours.  EXAM: General - Patient is Alert, Appropriate and Oriented Extremity - Neurovascular intact Sensation intact distally Intact pulses distally Dorsiflexion/Plantar flexion intact Incision - clean, dry Motor Function - intact, moving foot and toes well on exam.   Assessment/Plan: 2 Days Post-Op Procedure(s) (LRB): RIGHT TOTAL KNEE ARTHROPLASTY (Right) Procedure(s) (LRB): RIGHT TOTAL KNEE ARTHROPLASTY (Right) Past Medical History:  Diagnosis Date  . Anxiety   . 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 GERD (gastroesophageal reflux disease)   . Hx of adenomatous colonic polyps      tubulovillous adenoma  . Hypertension   . Pancytopenia    mild  . Pancytopenia 02/27/2011  . Restless leg syndrome   . Sleep apnea    wears CPAP/BIPAP every night  . Thyroid disease   . Viral syndrome    Hx  . Viral syndrome 02/27/2011   Active Problems:   S/P knee replacement  Estimated body mass index is 37.66 kg/m as calculated from the following:   Height as of this encounter: 5\' 9"  (1.753 m).   Weight as of this encounter: 115.7 kg (255 lb). Up with therapy Diet - Cardiac diet Follow up - in 2 weeks Activity - WBAT Disposition - Home Condition Upon Discharge - pending today D/C Meds - See DC Summary DVT Prophylaxis - Aspirin  Arlee Muslim, PA-C Orthopaedic Surgery 10/05/2016, 7:32 AM

## 2016-10-06 ENCOUNTER — Inpatient Hospital Stay (HOSPITAL_COMMUNITY): Payer: Medicare Other

## 2016-10-06 ENCOUNTER — Encounter (HOSPITAL_COMMUNITY): Payer: Self-pay | Admitting: Specialist

## 2016-10-06 LAB — BASIC METABOLIC PANEL
Anion gap: 9 (ref 5–15)
BUN: 21 mg/dL — ABNORMAL HIGH (ref 6–20)
CHLORIDE: 98 mmol/L — AB (ref 101–111)
CO2: 22 mmol/L (ref 22–32)
Calcium: 8.6 mg/dL — ABNORMAL LOW (ref 8.9–10.3)
Creatinine, Ser: 1.3 mg/dL — ABNORMAL HIGH (ref 0.61–1.24)
GFR calc non Af Amer: 55 mL/min — ABNORMAL LOW (ref >60)
Glucose, Bld: 134 mg/dL — ABNORMAL HIGH (ref 65–99)
POTASSIUM: 4.5 mmol/L (ref 3.5–5.1)
SODIUM: 129 mmol/L — AB (ref 135–145)

## 2016-10-06 LAB — CBC WITH DIFFERENTIAL/PLATELET
BASOS PCT: 0 %
Basophils Absolute: 0 10*3/uL (ref 0.0–0.1)
EOS ABS: 0 10*3/uL (ref 0.0–0.7)
Eosinophils Relative: 0 %
HCT: 33.3 % — ABNORMAL LOW (ref 39.0–52.0)
HEMOGLOBIN: 11.2 g/dL — AB (ref 13.0–17.0)
LYMPHS ABS: 1.3 10*3/uL (ref 0.7–4.0)
Lymphocytes Relative: 14 %
MCH: 28.6 pg (ref 26.0–34.0)
MCHC: 33.6 g/dL (ref 30.0–36.0)
MCV: 85.2 fL (ref 78.0–100.0)
Monocytes Absolute: 1.5 10*3/uL — ABNORMAL HIGH (ref 0.1–1.0)
Monocytes Relative: 15 %
NEUTROS PCT: 71 %
Neutro Abs: 6.7 10*3/uL (ref 1.7–7.7)
Platelets: 170 10*3/uL (ref 150–400)
RBC: 3.91 MIL/uL — AB (ref 4.22–5.81)
RDW: 14.4 % (ref 11.5–15.5)
WBC: 9.5 10*3/uL (ref 4.0–10.5)

## 2016-10-06 LAB — URINALYSIS, ROUTINE W REFLEX MICROSCOPIC
Bacteria, UA: NONE SEEN
Bilirubin Urine: NEGATIVE
GLUCOSE, UA: NEGATIVE mg/dL
KETONES UR: NEGATIVE mg/dL
LEUKOCYTES UA: NEGATIVE
NITRITE: NEGATIVE
PROTEIN: 30 mg/dL — AB
Specific Gravity, Urine: 1.015 (ref 1.005–1.030)
Squamous Epithelial / HPF: NONE SEEN
pH: 6 (ref 5.0–8.0)

## 2016-10-06 MED ORDER — ACETAMINOPHEN 500 MG PO TABS
1000.0000 mg | ORAL_TABLET | Freq: Three times a day (TID) | ORAL | Status: DC | PRN
  Administered 2016-10-06: 1000 mg via ORAL
  Filled 2016-10-06: qty 2

## 2016-10-06 MED ORDER — KETOROLAC TROMETHAMINE 15 MG/ML IJ SOLN
7.5000 mg | Freq: Four times a day (QID) | INTRAMUSCULAR | Status: DC
  Administered 2016-10-06: 7.5 mg via INTRAVENOUS
  Filled 2016-10-06: qty 1

## 2016-10-06 MED ORDER — SODIUM CHLORIDE 0.9 % IV SOLN
INTRAVENOUS | Status: DC
  Administered 2016-10-06: 03:00:00 via INTRAVENOUS

## 2016-10-06 MED ORDER — SODIUM CHLORIDE 0.9 % IV BOLUS (SEPSIS)
250.0000 mL | Freq: Once | INTRAVENOUS | Status: AC
  Administered 2016-10-06: 250 mL via INTRAVENOUS

## 2016-10-06 NOTE — Progress Notes (Signed)
Subjective: 3 Days Post-Op Procedure(s) (LRB): RIGHT TOTAL KNEE ARTHROPLASTY (Right) Patient reports pain as 3 on 0-10 scale.   Patient feels much better today. No CP nor SOB. Objective: Vital signs in last 24 hours: Temp:  [98.7 F (37.1 C)-102.9 F (39.4 C)] 100.5 F (38.1 C) (04/16 0440) Pulse Rate:  [103-126] 103 (04/16 0440) Resp:  [18-20] 19 (04/16 0440) BP: (102-136)/(51-78) 108/51 (04/16 0440) SpO2:  [92 %-98 %] 98 % (04/16 0440)  Intake/Output from previous day: 04/15 0701 - 04/16 0700 In: -  Out: 1325 [Urine:1325] Intake/Output this shift: Total I/O In: -  Out: 800 [Urine:800]   Recent Labs  10/04/16 0456 10/05/16 0419 10/06/16 0428  HGB 12.0* 11.0* 11.2*    Recent Labs  10/05/16 0419 10/06/16 0428  WBC 8.9 9.5  RBC 3.62* 3.91*  HCT 31.7* 33.3*  PLT 163 170    Recent Labs  10/04/16 0456 10/06/16 0428  NA 137 129*  K 5.0 4.5  CL 104 98*  CO2 25 22  BUN 31* 21*  CREATININE 1.56* 1.30*  GLUCOSE 137* 134*  CALCIUM 9.2 8.6*   No results for input(s): LABPT, INR in the last 72 hours. Awake eating O by 4 Sensation intact distally Intact pulses distally Dorsiflexion/Plantar flexion intact Incision: dressing C/D/I Compartment soft CXR negative U/A neg Assessment/Plan: 3 Days Post-Op Procedure(s) (LRB): RIGHT TOTAL KNEE ARTHROPLASTY (Right) Up with therapy Discharge home with home health Na 129 asymptomatic. Will restrict free water. Patient and wife want to go home .Encouraged and explained all questions. Doing well and stable on DC.  Reagan Klemz ANDREW 10/06/2016, 12:55 PM

## 2016-10-06 NOTE — Progress Notes (Signed)
Occupational Therapy Treatment Patient Details Name: Dakota Thompson MRN: [id number] DOB: 09-Jul-1945 Today's Date: 10/06/2016    History of present illness 71 y.o. male now s/p Rt TKA. PMH: restless leg syndrome, Charcot-marie tooth, anxiety   OT comments  Pt progressing towards goals, completed room level functional mobility this session and able to complete standing and sitting ADLs with MinGuard assist (TotalA for donning socks). Pt's wife will be able to assist with ADL needs PRN upon return home. Questions answered and education provided. Continue per POC.    Follow Up Recommendations  No OT follow up;Supervision/Assistance - 24 hour    Equipment Recommendations  None recommended by OT          Precautions / Restrictions Precautions Precautions: Knee Restrictions RLE Weight Bearing: Weight bearing as tolerated       Mobility Bed Mobility               General bed mobility comments: OOB in recliner  Transfers Overall transfer level: Needs assistance Equipment used: Rolling walker (2 wheeled) Transfers: Sit to/from Stand Sit to Stand: Min guard              Balance Overall balance assessment: Needs assistance Sitting-balance support: No upper extremity supported Sitting balance-Leahy Scale: Good     Standing balance support: Bilateral upper extremity supported Standing balance-Leahy Scale: Fair                             ADL either performed or assessed with clinical judgement   ADL Overall ADL's : Needs assistance/impaired Eating/Feeding: Independent;Sitting   Grooming: Wash/dry hands;Min guard;Standing           Upper Body Dressing : Set up;Sitting   Lower Body Dressing: Moderate assistance;Sit to/from stand Lower Body Dressing Details (indicate cue type and reason): MinGuard for donning shorts, totalA for donning socks; educated on AE for LB dressing Toilet Transfer: Min guard;Ambulation;BSC;RW Toilet Transfer Details  (indicate cue type and reason): BSC over toilet Toileting- Clothing Manipulation and Hygiene: Min guard;Sit to/from stand       Functional mobility during ADLs: Min guard;Rolling walker General ADL Comments: Pt completed room level functional mobility to complete toilet transfer and toileting, completed standing grooming ADLs at sink and UB/LB dressing                       Cognition Arousal/Alertness: Awake/alert Behavior During Therapy: WFL for tasks assessed/performed Overall Cognitive Status: Within Functional Limits for tasks assessed                                                      General Comments      Pertinent Vitals/ Pain       Pain Assessment: Faces Faces Pain Scale: Hurts a little bit Pain Location: R knee Pain Descriptors / Indicators: Sore Pain Intervention(s): Limited activity within patient's tolerance;Monitored during session;Ice applied  Home Living                                                        Frequency  Min 2X/week  Progress Toward Goals  OT Goals(current goals can now be found in the care plan section)  Progress towards OT goals: Progressing toward goals  Acute Rehab OT Goals Patient Stated Goal: feel better OT Goal Formulation: With patient Time For Goal Achievement: 10/11/16 Potential to Achieve Goals: Good  Plan Discharge plan remains appropriate                     End of Session Equipment Utilized During Treatment: Rolling walker  OT Visit Diagnosis: Muscle weakness (generalized) (M62.81);Unsteadiness on feet (R26.81)   Activity Tolerance Patient tolerated treatment well   Patient Left in chair;with call bell/phone within reach;with family/visitor present             Time: 5465-6812 OT Time Calculation (min): 22 min  Charges: OT General Charges $OT Visit: 1 Procedure OT Treatments $Self Care/Home Management : 8-22 mins  Dakota Thompson,  OT Pager [phone] 10/06/2016    Dakota Thompson 10/06/2016, 3:04 PM

## 2016-10-06 NOTE — Progress Notes (Signed)
Patient ID: Dakota Thompson, male   DOB: 11-21-1945, 71 y.o.   MRN: [id number] Subjective: 3 Days Post-Op Procedure(s) (LRB): RIGHT TOTAL KNEE ARTHROPLASTY (Right)   Reviewed events from yesterday and last night with nursing.  Chest x-ray and UA ordered for increase temps. Narcotics held over night Reports of confusion, weakness with ambulation  Patient reports pain as moderate.  Objective:   VITALS:   Vitals:   10/06/16 0111 10/06/16 0440  BP: (!) 102/55 (!) 108/51  Pulse: (!) 126 (!) 103  Resp: 20 19  Temp: 99.9 F (37.7 C) (!) 100.5 F (38.1 C)   Awake alert oriented this am, wife in room Neurovascular intact Incision: scant drainage on aquacell dressing   LABS  Recent Labs  10/04/16 0456 10/05/16 0419 10/06/16 0428  HGB 12.0* 11.0* 11.2*  HCT 36.5* 31.7* 33.3*  WBC 11.6* 8.9 9.5  PLT 208 163 170     Recent Labs  10/04/16 0456 10/06/16 0428  NA 137 129*  K 5.0 4.5  BUN 31* 21*  CREATININE 1.56* 1.30*  GLUCOSE 137* 134*    No results for input(s): LABPT, INR in the last 72 hours.   Assessment/Plan: 3 Days Post-Op Procedure(s) (LRB): RIGHT TOTAL KNEE ARTHROPLASTY (Right)  Plan: Minimize narcotics today to see if his issues related to narcotic build up - started tylenol 1000mg  q8 hr and toradol for non-narcotic pain relief Differential would include possible pulmonary event (PE), nurses notified to monitor sats and mental status and to report to Dr. Theda Sers concerns If still issues then spiral CT can be ordered to rule out PE Dr Theda Sers aware of situation and will check in on him today

## 2016-10-06 NOTE — Progress Notes (Signed)
Physical Therapy Treatment Patient Details Name: Dakota Thompson MRN: [id number] DOB: [birth year] Today's Date: 10/06/2016    History of Present Illness 71 y.o. male now s/p Rt TKA. PMH: restless leg syndrome, Charcot-marie tooth, anxiety    PT Comments    POD # 3 am session spouse present and very helpful.  Applied KI and instructed on use.  Assisted with amb a greater distance in hallway wearing KI for increased support.    Assisted with shower transfer.    Follow Up Recommendations  Home health PT;Supervision for mobility/OOB     Equipment Recommendations  Rolling walker with 5" wheels    Recommendations for Other Services       Precautions / Restrictions Precautions Precautions: Knee Precaution Comments: instructed on KI use and proper application Required Braces or Orthoses: Knee Immobilizer - Right Restrictions Weight Bearing Restrictions: No RLE Weight Bearing: Weight bearing as tolerated    Mobility  Bed Mobility Overal bed mobility: Needs Assistance Bed Mobility: Rolling     Supine to sit: Supervision;Min guard     General bed mobility comments: R LE off bed and increased time  Transfers Overall transfer level: Needs assistance Equipment used: Rolling walker (2 wheeled) Transfers: Sit to/from Stand Sit to Stand: Min guard;Supervision         General transfer comment: 25% cues for hand placement  Ambulation/Gait Ambulation/Gait assistance: Supervision;Min guard Ambulation Distance (Feet): 85 Feet Assistive device: Rolling walker (2 wheeled) Gait Pattern/deviations: Step-through pattern;Decreased stride length;Decreased weight shift to right Gait velocity: decreased   General Gait Details: tolerated an increased distance.  Wore KI for increased support.  25% VC's on safety with turns   Financial trader [name] (Stroke Patients Only)       Balance Overall balance assessment: Needs  assistance Sitting-balance support: No upper extremity supported Sitting balance-Leahy Scale: Good     Standing balance support: Bilateral upper extremity supported Standing balance-Leahy Scale: Fair                              Cognition Arousal/Alertness: Awake/alert Behavior During Therapy: WFL for tasks assessed/performed Overall Cognitive Status: Within Functional Limits for tasks assessed                                        Exercises      General Comments        Pertinent Vitals/Pain Pain Assessment: 0-10 Pain Score: 4  Faces Pain Scale: Hurts a little bit Pain Location: R knee Pain Descriptors / Indicators: Discomfort;Grimacing;Operative site guarding;Tender Pain Intervention(s): Monitored during session;Repositioned;Ice applied    Home Living                      Prior Function            PT Goals (current goals can now be found in the care plan section) Acute Rehab PT Goals Patient Stated Goal: feel better Progress towards PT goals: Progressing toward goals    Frequency    7X/week      PT Plan Current plan remains appropriate    Co-evaluation             End of Session Equipment Utilized During Treatment: Gait belt;Right knee immobilizer Activity Tolerance: Patient tolerated treatment  well Patient left: in chair;with family/visitor present;with call bell/phone within reach (in) Nurse Communication: Mobility status PT Visit Diagnosis: Unsteadiness on feet (R26.81);Muscle weakness (generalized) (M62.81)     Time: 8677-3736 PT Time Calculation (min) (ACUTE ONLY): 32 min  Charges:  $Gait Training: 8-22 mins $Therapeutic Activity: 8-22 mins                    G Codes:       Rica Koyanagi  PTA [facility]  Acute  Rehab Pager      [phone]

## 2016-10-06 NOTE — Progress Notes (Addendum)
Nurse tech walked patient to bathroom. Patient became unsteady on feet. Nurse tech requested patient to sit on toilet. Patient sat on toilet while nurse tech cleaned urine out of floor. Second nurse tech alerted nurse of situation. Nurse requested set of vital signs to be taken. Vital signs as follows: 99.9 oral temp, BP 102/55 sitting, P 126, R 20, [place] 92% on room air. Nurse went into bathroom with 2 nurse techs to assist patient back to bed. 1 nurse tech and nurse assisted patient back to bed with walker. While going back to bed patient is closing eyes and leaning backwards. Nurse had to give cues twice to open eyes and continue to walk back to bed. Nurse asked patient if he was sleepy. Patient reported "Yes". Nurse asked patient if he felt foggy, patient replied "Yes". Patient is currently laying in bed. Nurse informed patient and wife, nurse would call provider. Nurse paged provider on call at [facility].  Arlee Muslim returned call to nurse. Dian Situ is aware of above information. Nurse entered verbal orders from [facility] as follows: EKG  NS bolus of 226ml continuous fluids at 90ml/hr of normal saline after the blous Repeat chest xray, 2 view for comparison/follow up Continuous pulse ox Urinalysis Place patient on 2L of oxygen with CPAP and via nasal cannula when off of CPAP.  Repeat BP after NS bolus

## 2016-10-06 NOTE — Progress Notes (Signed)
Physical Therapy Treatment Patient Details Name: Dakota Thompson MRN: [id number] DOB: 04-24-1946 Today's Date: 10/06/2016    History of Present Illness 71 y.o. male now s/p Rt TKA. PMH: restless leg syndrome, Charcot-marie tooth, anxiety    PT Comments    POD # 3 pm session With spouse present, performed stair staining with one crutch (issued) and one rail.  Returned to room then performed TKR TE's following HEP handout.  Instructed on proper tech, freq as well as use of ICE.  Pt ready for D/C to home.   Follow Up Recommendations  Home health PT;Supervision for mobility/OOB     Equipment Recommendations  Rolling walker with 5" wheels    Recommendations for Other Services       Precautions / Restrictions Precautions Precautions: Knee Precaution Comments: instructed on KI use and proper application Required Braces or Orthoses: Knee Immobilizer - Right Restrictions Weight Bearing Restrictions: No RLE Weight Bearing: Weight bearing as tolerated    Mobility  Bed Mobility OOB in recliner  Transfers Overall transfer level: Needs assistance Equipment used: Rolling walker (2 wheeled) Transfers: Sit to/from Stand Sit to Stand: Min guard;Supervision         General transfer comment: 25% cues for hand placement  Ambulation/Gait Ambulation/Gait assistance: Supervision;Min guard Ambulation Distance (Feet): 85 Feet Assistive device: Rolling walker (2 wheeled) Gait Pattern/deviations: Step-through pattern;Decreased stride length;Decreased weight shift to right Gait velocity: decreased   General Gait Details: tolerated an increased distance.  Wore KI for increased support.  25% VC's on safety with turns   Stairs Stairs: Yes   Stair Management: One rail Left;Step to pattern;Forwards;With crutches Number of Stairs: 5 General stair comments: with spouse present for "Hands on" education safety stair with one rail and one crutch  Wheelchair Mobility    Modified [name]  (Stroke Patients Only)       Balance Overall balance assessment: Needs assistance Sitting-balance support: No upper extremity supported Sitting balance-Leahy Scale: Good     Standing balance support: Bilateral upper extremity supported Standing balance-Leahy Scale: Fair                              Cognition Arousal/Alertness: Awake/alert Behavior During Therapy: WFL for tasks assessed/performed Overall Cognitive Status: Within Functional Limits for tasks assessed                                        Exercises   Total Knee Replacement TE's 10 reps B LE ankle pumps 10 reps towel squeezes 10 reps knee presses 10 reps heel slides  10 reps SAQ's 10 reps SLR's 10 reps ABD Followed by ICE     General Comments        Pertinent Vitals/Pain Pain Assessment: 0-10 Pain Score: 4  Faces Pain Scale: Hurts a little bit Pain Location: R knee Pain Descriptors / Indicators: Discomfort;Grimacing;Operative site guarding;Tender Pain Intervention(s): Monitored during session;Repositioned;Ice applied    Home Living                      Prior Function            PT Goals (current goals can now be found in the care plan section) Acute Rehab PT Goals Patient Stated Goal: feel better Progress towards PT goals: Progressing toward goals    Frequency    7X/week  PT Plan Current plan remains appropriate    Co-evaluation             End of Session Equipment Utilized During Treatment: Gait belt;Right knee immobilizer Activity Tolerance: Patient tolerated treatment well Patient left: in chair;with family/visitor present;with call bell/phone within reach (in) Nurse Communication: Mobility status PT Visit Diagnosis: Unsteadiness on feet (R26.81);Muscle weakness (generalized) (M62.81)     Time: 6759-1638 PT Time Calculation (min) (ACUTE ONLY): 28 min  Charges:  $Gait Training: 8-22 mins $Therapeutic Activity: 8-22 mins                     G Codes:       Rica Koyanagi  PTA [facility]  Acute  Rehab Pager      [phone]

## 2016-10-06 NOTE — Progress Notes (Signed)
Discharge planning, spoke with patient and spouse at beside. Chose [facility] for [facility] services, contacted [facility] for referral. Needs RW, has a 3-n-1, contacted [facility] to deliver to room. Requesting Tressia Danas for therapist, notified [facility]. [phone]

## 2016-10-06 NOTE — Progress Notes (Signed)
CSW consulted for SNF placement. Per chart Plan: Home w/ H.H services.

## 2016-10-10 NOTE — Discharge Summary (Signed)
Physician Discharge Summary  Patient ID: Dakota Thompson MRN: [id number] DOB/AGE: 1945/10/09 71 y.o.  Admit date: 10/03/2016 Discharge date: 10/06/2016  Admission Diagnoses: Knee primary OA  Discharge Diagnoses:  Active Problems:   S/P knee replacement   Discharged Condition: good  Hospital Course:  Dakota Thompson is a 71 y.o. who was admitted to [name]. They were brought to the operating room on 10/03/2016 and underwent Procedure(s): RIGHT TOTAL KNEE ARTHROPLASTY.  Patient tolerated the procedure well and was later transferred to the recovery room and then to the orthopaedic floor for postoperative care.  They were given PO and IV analgesics for pain control following their surgery.  They were given 24 hours of postoperative antibiotics of  Anti-infectives    Start     Dose/Rate Route Frequency Ordered Stop   10/03/16 2200  ceFAZolin (ANCEF) IVPB 2g/100 mL premix     2 g 200 mL/hr over 30 Minutes Intravenous Every 6 hours 10/03/16 2143 10/04/16 0430   10/03/16 1245  ceFAZolin (ANCEF) IVPB 2g/100 mL premix     2 g 200 mL/hr over 30 Minutes Intravenous On call to O.R. 10/03/16 1231 10/03/16 1521     and started on DVT prophylaxis in the form of lovenox.   PT and OT were ordered for total joint protocol.  Discharge planning consulted to help with postop disposition and equipment needs.  Patient had a good night on the evening of surgery and started to get up OOB with therapy on day one.  Hemovac drain was pulled without difficulty.  Continued to work with therapy into day two.  Dressing was with normal limits.  The patient had progressed with therapy and meeting their goals. Patient was seen in rounds and was ready to go home later today. D/c delayed due to medication changes.  Consults: n/a  Significant Diagnostic Studies: routine  Treatments: routine Discharge Exam: Blood pressure (!) 108/51, pulse (!) 103, temperature (!) 100.5 F (38.1 C), temperature source Oral, resp.  rate 19, height 5\' 9"  (1.753 m), weight 115.7 kg (255 lb), SpO2 98 %. Well nourished. Alert and oriented x3. RRR, Lungs clear, BS x4. Abdomen soft and non tender. Right Calf soft and non tender. Right knee dressing C/D/I. No DVT signs. Compartment soft. No signs of infection.  Right LE grossly neurovascular intact. Disposition: 01-Home or Self Care  Discharge Instructions    Call MD / Call 911    Complete by:  As directed    If you experience chest pain or shortness of breath, CALL 911 and be transported to the hospital emergency room.  If you develope a fever above 101 F, pus (white drainage) or increased drainage or redness at the wound, or calf pain, call your surgeon's office.   Constipation Prevention    Complete by:  As directed    Drink plenty of fluids.  Prune juice may be helpful.  You may use a stool softener, such as Colace (over the counter) 100 mg twice a day.  Use MiraLax (over the counter) for constipation as needed.   Diet - low sodium heart healthy    Complete by:  As directed    Discharge instructions    Complete by:  As directed    INSTRUCTIONS AFTER JOINT REPLACEMENT   Remove items at home which could result in a fall. This includes throw rugs or furniture in walking pathways ICE to the affected joint every three hours while awake for 30 minutes at a time, for at  least the first 3-5 days, and then as needed for pain and swelling.  Continue to use ice for pain and swelling. You may notice swelling that will progress down to the foot and ankle.  This is normal after surgery.  Elevate your leg when you are not up walking on it.   Continue to use the breathing machine you got in the hospital (incentive spirometer) which will help keep your temperature down.  It is common for your temperature to cycle up and down following surgery, especially at night when you are not up moving around and exerting yourself.  The breathing machine keeps your lungs expanded and your temperature  down.   DIET:  As you were doing prior to hospitalization, we recommend a well-balanced diet.  DRESSING / WOUND CARE / SHOWERING  Keep the surgical dressing until follow up.  The dressing is water proof, so you can shower without any extra covering.  IF THE DRESSING FALLS OFF or the wound gets wet inside, change the dressing with sterile gauze.  Please use good hand washing techniques before changing the dressing.  Do not use any lotions or creams on the incision until instructed by your surgeon.    ACTIVITY  Increase activity slowly as tolerated, but follow the weight bearing instructions below.   No driving for 6 weeks or until further direction given by your physician.  You cannot drive while taking narcotics.  No lifting or carrying greater than 10 lbs. until further directed by your surgeon. Avoid periods of inactivity such as sitting longer than an hour when not asleep. This helps prevent blood clots.  You may return to work once you are authorized by your doctor.     WEIGHT BEARING   Weight bearing as tolerated with assist device (walker, cane, etc) as directed, use it as long as suggested by your surgeon or therapist, typically at least 4-6 weeks.   EXERCISES  Results after joint replacement surgery are often greatly improved when you follow the exercise, range of motion and muscle strengthening exercises prescribed by your doctor. Safety measures are also important to protect the joint from further injury. Any time any of these exercises cause you to have increased pain or swelling, decrease what you are doing until you are comfortable again and then slowly increase them. If you have problems or questions, call your caregiver or physical therapist for advice.   Rehabilitation is important following a joint replacement. After just a few days of immobilization, the muscles of the leg can become weakened and shrink (atrophy).  These exercises are designed to build up the tone and  strength of the thigh and leg muscles and to improve motion. Often times heat used for twenty to thirty minutes before working out will loosen up your tissues and help with improving the range of motion but do not use heat for the first two weeks following surgery (sometimes heat can increase post-operative swelling).   These exercises can be done on a training (exercise) mat, on the floor, on a table or on a bed. Use whatever works the best and is most comfortable for you.    Use music or television while you are exercising so that the exercises are a pleasant break in your day. This will make your life better with the exercises acting as a break in your routine that you can look forward to.   Perform all exercises about fifteen times, three times per day or as directed.  You should exercise  both the operative leg and the other leg as well.   Exercises include:   Quad Sets - Tighten up the muscle on the front of the thigh (Quad) and hold for 5-10 seconds.   Straight Leg Raises - With your knee straight (if you were given a brace, keep it on), lift the leg to 60 degrees, hold for 3 seconds, and slowly lower the leg.  Perform this exercise against resistance later as your leg gets stronger.  Leg Slides: Lying on your back, slowly slide your foot toward your buttocks, bending your knee up off the floor (only go as far as is comfortable). Then slowly slide your foot back down until your leg is flat on the floor again.  Angel Wings: Lying on your back spread your legs to the side as far apart as you can without causing discomfort.  Hamstring Strength:  Lying on your back, push your heel against the floor with your leg straight by tightening up the muscles of your buttocks.  Repeat, but this time bend your knee to a comfortable angle, and push your heel against the floor.  You may put a pillow under the heel to make it more comfortable if necessary.   A rehabilitation program following joint replacement  surgery can speed recovery and prevent re-injury in the future due to weakened muscles. Contact your doctor or a physical therapist for more information on knee rehabilitation.    CONSTIPATION  Constipation is defined medically as fewer than three stools per week and severe constipation as less than one stool per week.  Even if you have a regular bowel pattern at home, your normal regimen is likely to be disrupted due to multiple reasons following surgery.  Combination of anesthesia, postoperative narcotics, change in appetite and fluid intake all can affect your bowels.   YOU MUST use at least one of the following options; they are listed in order of increasing strength to get the job done.  They are all available over the counter, and you may need to use some, POSSIBLY even all of these options:    Drink plenty of fluids (prune juice may be helpful) and high fiber foods Colace 100 mg by mouth twice a day  Senokot for constipation as directed and as needed Dulcolax (bisacodyl), take with full glass of water  Miralax (polyethylene glycol) once or twice a day as needed.  If you have tried all these things and are unable to have a bowel movement in the first 3-4 days after surgery call either your surgeon or your primary doctor.    If you experience loose stools or diarrhea, hold the medications until you stool forms back up.  If your symptoms do not get better within 1 week or if they get worse, check with your doctor.  If you experience "the worst abdominal pain ever" or develop nausea or vomiting, please contact the office immediately for further recommendations for treatment.   ITCHING:  If you experience itching with your medications, try taking only a single pain pill, or even half a pain pill at a time.  You can also use Benadryl over the counter for itching or also to help with sleep.   TED HOSE STOCKINGS:  Use stockings on both legs until for at least 2 weeks or as directed by physician  office. They may be removed at night for sleeping.  MEDICATIONS:  See your medication summary on the "After Visit Summary" that nursing will review with you.  You  may have some home medications which will be placed on hold until you complete the course of blood thinner medication.  It is important for you to complete the blood thinner medication as prescribed.  PRECAUTIONS:  If you experience chest pain or shortness of breath - call 911 immediately for transfer to the hospital emergency department.   If you develop a fever greater that 101 F, purulent drainage from wound, increased redness or drainage from wound, foul odor from the wound/dressing, or calf pain - CONTACT YOUR SURGEON.                                                   FOLLOW-UP APPOINTMENTS:  If you do not already have a post-op appointment, please call the office for an appointment to be seen by your surgeon.  Guidelines for how soon to be seen are listed in your "After Visit Summary", but are typically between 1-4 weeks after surgery.  OTHER INSTRUCTIONS:   Knee Replacement:  Do not place pillow under knee, focus on keeping the knee straight while resting. CPM instructions: 0-90 degrees, 2 hours in the morning, 2 hours in the afternoon, and 2 hours in the evening. Place foam block, curve side up under heel at all times except when in CPM or when walking.  DO NOT modify, tear, cut, or change the foam block in any way.  MAKE SURE YOU:  Understand these instructions.  Get help right away if you are not doing well or get worse.    Thank you for letting us be a part of your medical care team.  It is a privilege we respect greatly.  We hope these instructions will help you stay on track for a fast and full recovery!   Do not put a pillow under the knee. Place it under the heel.    Complete by:  As directed    Increase activity slowly as tolerated    Complete by:  As directed      Allergies as of 10/06/2016   No Known Allergies      Medication List    STOP taking these medications   celecoxib 100 MG capsule Commonly known as:  CELEBREX     TAKE these medications   aspirin EC 325 MG tablet Take 1 tablet (325 mg total) by mouth 2 (two) times daily. What changed:  medication strength  how much to take  when to take this   Coenzyme Q10 200 MG capsule Take 200 mg by mouth every evening.   docusate sodium 100 MG capsule Commonly known as:  COLACE Take 200 mg by mouth daily.   Fish Oil 1200 MG Caps Take 1,200-2,400 mg by mouth 2 (two) times daily. 2400 mg in the morning & 1200 mg in the evening.   gabapentin 300 MG capsule Commonly known as:  NEURONTIN Take 600 mg by mouth at bedtime.   gemfibrozil 600 MG tablet Commonly known as:  LOPID Take 600 mg by mouth 2 (two) times daily.   methocarbamol 500 MG tablet Commonly known as:  ROBAXIN Take 1 tablet (500 mg total) by mouth every 8 (eight) hours as needed for muscle spasms.   multivitamin with minerals Tabs tablet Take 1 tablet by mouth daily. Centrum Silver   olmesartan 40 MG tablet Commonly known as:  BENICAR Take 40 mg by mouth  daily.   omeprazole 20 MG capsule Commonly known as:  PRILOSEC Take 20 mg by mouth 2 (two) times daily.   oxyCODONE 5 MG immediate release tablet Commonly known as:  ROXICODONE Take 1-2 tablets (5-10 mg total) by mouth every 4 (four) hours as needed for severe pain.   pravastatin 20 MG tablet Commonly known as:  PRAVACHOL Take 20 mg by mouth every evening.      Follow-up Information    [facility] Follow up.   Why:  physical therapy Contact information: [place] [address] [place] Follow up.   Why:  rolling walker Contact information: [place] [address] [phone]           Signed: Lajean Manes 10/10/2016, 2:31 PM

## 2017-01-15 ENCOUNTER — Emergency Department (HOSPITAL_COMMUNITY): Payer: Medicare Other

## 2017-01-15 ENCOUNTER — Inpatient Hospital Stay (HOSPITAL_COMMUNITY)
Admission: EM | Admit: 2017-01-15 | Discharge: 2017-01-20 | DRG: 392 | Disposition: A | Discharge status: Home / Self Care | Payer: Medicare Other | Attending: Internal Medicine | Admitting: Internal Medicine

## 2017-01-15 ENCOUNTER — Encounter (HOSPITAL_COMMUNITY): Payer: Self-pay | Admitting: Emergency Medicine

## 2017-01-15 DIAGNOSIS — G4730 Sleep apnea, unspecified: Secondary | ICD-10-CM | POA: Diagnosis present

## 2017-01-15 DIAGNOSIS — R0902 Hypoxemia: Secondary | ICD-10-CM

## 2017-01-15 DIAGNOSIS — Z79899 Other long term (current) drug therapy: Secondary | ICD-10-CM

## 2017-01-15 DIAGNOSIS — E785 Hyperlipidemia, unspecified: Secondary | ICD-10-CM | POA: Diagnosis present

## 2017-01-15 DIAGNOSIS — K5720 Diverticulitis of large intestine with perforation and abscess without bleeding: Principal | ICD-10-CM | POA: Diagnosis present

## 2017-01-15 DIAGNOSIS — Z7982 Long term (current) use of aspirin: Secondary | ICD-10-CM

## 2017-01-15 DIAGNOSIS — Z96651 Presence of right artificial knee joint: Secondary | ICD-10-CM | POA: Diagnosis present

## 2017-01-15 DIAGNOSIS — K5780 Diverticulitis of intestine, part unspecified, with perforation and abscess without bleeding: Secondary | ICD-10-CM | POA: Diagnosis present

## 2017-01-15 DIAGNOSIS — I10 Essential (primary) hypertension: Secondary | ICD-10-CM | POA: Diagnosis present

## 2017-01-15 DIAGNOSIS — G2581 Restless legs syndrome: Secondary | ICD-10-CM | POA: Diagnosis present

## 2017-01-15 DIAGNOSIS — Z87891 Personal history of nicotine dependence: Secondary | ICD-10-CM | POA: Diagnosis not present

## 2017-01-15 DIAGNOSIS — Z86010 Personal history of colonic polyps: Secondary | ICD-10-CM

## 2017-01-15 DIAGNOSIS — K5792 Diverticulitis of intestine, part unspecified, without perforation or abscess without bleeding: Secondary | ICD-10-CM | POA: Diagnosis present

## 2017-01-15 DIAGNOSIS — K219 Gastro-esophageal reflux disease without esophagitis: Secondary | ICD-10-CM | POA: Diagnosis not present

## 2017-01-15 DIAGNOSIS — E8770 Fluid overload, unspecified: Secondary | ICD-10-CM | POA: Diagnosis present

## 2017-01-15 LAB — CBC
HEMATOCRIT: 42.8 % (ref 39.0–52.0)
Hemoglobin: 14.3 g/dL (ref 13.0–17.0)
MCH: 28.4 pg (ref 26.0–34.0)
MCHC: 33.4 g/dL (ref 30.0–36.0)
MCV: 84.9 fL (ref 78.0–100.0)
Platelets: 189 10*3/uL (ref 150–400)
RBC: 5.04 MIL/uL (ref 4.22–5.81)
RDW: 15 % (ref 11.5–15.5)
WBC: 11.2 10*3/uL — AB (ref 4.0–10.5)

## 2017-01-15 LAB — URINALYSIS, ROUTINE W REFLEX MICROSCOPIC
BACTERIA UA: NONE SEEN
BILIRUBIN URINE: NEGATIVE
Glucose, UA: NEGATIVE mg/dL
Hgb urine dipstick: NEGATIVE
KETONES UR: NEGATIVE mg/dL
LEUKOCYTES UA: NEGATIVE
Nitrite: NEGATIVE
Protein, ur: 30 mg/dL — AB
pH: 6 (ref 5.0–8.0)

## 2017-01-15 LAB — COMPREHENSIVE METABOLIC PANEL
ALBUMIN: 4 g/dL (ref 3.5–5.0)
ALT: 19 U/L (ref 17–63)
AST: 22 U/L (ref 15–41)
Alkaline Phosphatase: 41 U/L (ref 38–126)
Anion gap: 9 (ref 5–15)
BUN: 32 mg/dL — AB (ref 6–20)
CHLORIDE: 104 mmol/L (ref 101–111)
CO2: 23 mmol/L (ref 22–32)
Calcium: 9 mg/dL (ref 8.9–10.3)
Creatinine, Ser: 1.12 mg/dL (ref 0.61–1.24)
GFR calc Af Amer: >60 mL/min (ref >60)
GFR calc non Af Amer: >60 mL/min (ref >60)
GLUCOSE: 159 mg/dL — AB (ref 65–99)
POTASSIUM: 4.2 mmol/L (ref 3.5–5.1)
SODIUM: 136 mmol/L (ref 135–145)
Total Bilirubin: 1.4 mg/dL — ABNORMAL HIGH (ref 0.3–1.2)
Total Protein: 7.7 g/dL (ref 6.5–8.1)

## 2017-01-15 LAB — LIPASE, BLOOD: LIPASE: 25 U/L (ref 11–51)

## 2017-01-15 LAB — LACTIC ACID, PLASMA
LACTIC ACID, VENOUS: 1.1 mmol/L (ref 0.5–1.9)
Lactic Acid, Venous: 1 mmol/L (ref 0.5–1.9)

## 2017-01-15 MED ORDER — PANTOPRAZOLE SODIUM 40 MG IV SOLR
40.0000 mg | INTRAVENOUS | Status: DC
  Administered 2017-01-15 – 2017-01-16 (×2): 40 mg via INTRAVENOUS
  Filled 2017-01-15 (×2): qty 40

## 2017-01-15 MED ORDER — SODIUM CHLORIDE 0.9 % IV SOLN
INTRAVENOUS | Status: DC
  Administered 2017-01-15 – 2017-01-17 (×3): via INTRAVENOUS

## 2017-01-15 MED ORDER — ACETAMINOPHEN 650 MG RE SUPP: 650.0000 mg | Freq: Four times a day (QID) | RECTAL | Status: DC | PRN

## 2017-01-15 MED ORDER — HYDROMORPHONE HCL 1 MG/ML IJ SOLN
1.0000 mg | Freq: Once | INTRAMUSCULAR | Status: AC
  Administered 2017-01-15: 1 mg via INTRAVENOUS
  Filled 2017-01-15: qty 1

## 2017-01-15 MED ORDER — IOPAMIDOL (ISOVUE-300) INJECTION 61%
100.0000 mL | Freq: Once | INTRAVENOUS | Status: AC | PRN
  Administered 2017-01-15: 100 mL via INTRAVENOUS

## 2017-01-15 MED ORDER — ENOXAPARIN SODIUM 40 MG/0.4ML ~~LOC~~ SOLN
40.0000 mg | SUBCUTANEOUS | Status: DC
  Administered 2017-01-15 – 2017-01-19 (×5): 40 mg via SUBCUTANEOUS
  Filled 2017-01-15 (×5): qty 0.4

## 2017-01-15 MED ORDER — METHOCARBAMOL 1000 MG/10ML IJ SOLN
500.0000 mg | Freq: Three times a day (TID) | INTRAVENOUS | Status: DC | PRN
  Filled 2017-01-15: qty 5

## 2017-01-15 MED ORDER — ONDANSETRON HCL 4 MG PO TABS
4.0000 mg | ORAL_TABLET | Freq: Four times a day (QID) | ORAL | Status: DC | PRN
  Filled 2017-01-15: qty 1

## 2017-01-15 MED ORDER — ONDANSETRON HCL 4 MG/2ML IJ SOLN
4.0000 mg | Freq: Once | INTRAMUSCULAR | Status: AC
  Administered 2017-01-15: 4 mg via INTRAVENOUS
  Filled 2017-01-15: qty 2

## 2017-01-15 MED ORDER — ONDANSETRON HCL 4 MG/2ML IJ SOLN
4.0000 mg | Freq: Four times a day (QID) | INTRAMUSCULAR | Status: DC | PRN
  Administered 2017-01-17 – 2017-01-19 (×2): 4 mg via INTRAVENOUS
  Filled 2017-01-15 (×2): qty 2

## 2017-01-15 MED ORDER — PIPERACILLIN-TAZOBACTAM 3.375 G IVPB
3.3750 g | Freq: Three times a day (TID) | INTRAVENOUS | Status: DC
  Administered 2017-01-15 – 2017-01-20 (×14): 3.375 g via INTRAVENOUS
  Filled 2017-01-15 (×14): qty 50

## 2017-01-15 MED ORDER — HYDRALAZINE HCL 20 MG/ML IJ SOLN: 10.0000 mg | Freq: Four times a day (QID) | INTRAMUSCULAR | Status: DC | PRN

## 2017-01-15 MED ORDER — HYDROMORPHONE HCL 1 MG/ML IJ SOLN
1.0000 mg | INTRAMUSCULAR | Status: DC | PRN
  Administered 2017-01-15 – 2017-01-19 (×21): 1 mg via INTRAVENOUS
  Filled 2017-01-15 (×21): qty 1

## 2017-01-15 MED ORDER — PIPERACILLIN-TAZOBACTAM 3.375 G IVPB 30 MIN
3.3750 g | Freq: Once | INTRAVENOUS | Status: AC
  Administered 2017-01-15: 3.375 g via INTRAVENOUS
  Filled 2017-01-15: qty 50

## 2017-01-15 MED ORDER — ACETAMINOPHEN 325 MG PO TABS: 650.0000 mg | ORAL_TABLET | Freq: Four times a day (QID) | ORAL | Status: DC | PRN

## 2017-01-15 NOTE — ED Triage Notes (Signed)
Pt c/o left side abd pain x 2 days, worsening today. Pt c/o constipation x 3 days, states he took a laxative last night with results this am but pain is worse. Denies n/v. Nad noted.

## 2017-01-15 NOTE — ED Notes (Signed)
Placed on 2 liters oxygen.

## 2017-01-15 NOTE — H&P (Signed)
History and Physical    Dakota Thompson URK:[id number] DOB: [birth year] DOA: 01/15/2017  PCP: Redmond School, MD  Patient coming from: home  I have personally briefly reviewed patient's old medical records in [place]  Chief Complaint: abdominal pain  HPI: Dakota Thompson is a 71 y.o. male with medical history significant of hypertension, hyperlipidemia who presents to the emergency room with complaints of abdominal pain. Patient reports onset of symptoms occurring 2-3 days ago. They have been intermittent, but worse so since last night. He has not had any vomiting. He was feeling constipated but use a laxative and had a bowel movement prior to admission. He has left lower quadrant abdominal pain which feels sharp. He feels that this does radiate to the right side of his abdomen. He has felt feverish since last night. No cough or shortness of breath, no chest pain. No melena or hematochezia. He reports his last stool to be liquid  ED Course: He was evaluated in the emergency room where CT scan of the abdomen and pelvis indicated perforated sigmoid diverticulitis. He will receive a dose of intravenous antibiotics and pain management. General surgery was consulted who recommended inpatient admission. They will following in consult.  Review of Systems: As per HPI otherwise 10 point review of systems negative.    Past Medical History:  Diagnosis Date  . Anxiety   . 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 GERD (gastroesophageal reflux disease)   . Hx of adenomatous colonic polyps    tubulovillous adenoma  . Hypertension   . Pancytopenia    mild  . Pancytopenia 02/27/2011  . Restless leg syndrome   . Sleep apnea    wears CPAP/BIPAP every night  . Thyroid disease   . Viral syndrome    Hx  . Viral syndrome 02/27/2011    Past Surgical History:  Procedure Laterality Date  . COLONOSCOPY  01/02/2010   RMR: normal rectum and colon.  marginal prep compromised exam.   . COLONOSCOPY N/A 03/15/2015   Procedure: COLONOSCOPY;  Surgeon: Daneil Dolin, MD;  [facility];  Service: Endoscopy;  Laterality: N/A;  1215 - moved to 12:45 - office to notify  . ESOPHAGOGASTRODUODENOSCOPY N/A 03/15/2015   Procedure: ESOPHAGOGASTRODUODENOSCOPY (EGD);  Surgeon: Daneil Dolin, MD;  [facility]: [facility];  Service: Endoscopy;  Laterality: N/A;  . EYE SURGERY     bilateral cataracts  . GANGLION CYST EXCISION    . knee cartilage repair  01/31/11  . NASAL SINUS SURGERY    . 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 His smoking use included Cigarettes. He has never used smokeless tobacco. He reports that he does not drink alcohol or use drugs.  No Known Allergies  Family History  Problem Relation Age of Onset  . Cancer Brother        unknown type  . Cancer Sister        unknown type  . Colon cancer Neg Hx      Prior to Admission medications   Medication Sig Start Date End Date Taking? Authorizing Provider  aspirin EC 325 MG tablet Take 1 tablet (325 mg total) by mouth 2 (two) times daily. 10/03/16  Yes Stilwell, Bryson L, PA-C  Coenzyme Q10 200  MG capsule Take 200 mg by mouth every evening.   Yes [provider]  docusate sodium (COLACE) 100 MG capsule Take 200 mg by mouth daily.   Yes [provider]  gabapentin (NEURONTIN) 300 MG capsule Take 600 mg by mouth at bedtime.    Yes [provider]  HYDROcodone-acetaminophen (NORCO/VICODIN) 5-325 MG tablet Take 1-2 tablets by mouth every 4 (four) hours as needed for pain. 10/24/16  Yes [provider]  methocarbamol (ROBAXIN) 500 MG tablet Take 1 tablet (500 mg total) by mouth every 8 (eight) hours as needed for muscle spasms. 10/03/16  Yes Stilwell, Bryson L,  PA-C  Multiple Vitamin (MULTIVITAMIN WITH MINERALS) TABS tablet Take 1 tablet by mouth daily. Centrum Silver   Yes [provider]  olmesartan (BENICAR) 40 MG tablet Take 40 mg by mouth daily. 07/26/15  Yes [provider]  Omega-3 Fatty Acids (FISH OIL) 1200 MG CAPS Take 1,200-2,400 mg by mouth 2 (two) times daily. 2400 mg in the morning & 1200 mg in the evening.   Yes [provider]  omeprazole (PRILOSEC) 20 MG capsule Take 20 mg by mouth 2 (two) times daily.     Yes [provider]  pravastatin (PRAVACHOL) 20 MG tablet Take 20 mg by mouth every evening.    Yes [provider]  celecoxib (CELEBREX) 100 MG capsule Take 1 capsule by mouth 2 (two) times daily as needed for pain. 10/17/16   [provider]  gemfibrozil (LOPID) 600 MG tablet Take 600 mg by mouth 2 (two) times daily. 08/24/16   [provider]    Physical Exam: Vitals:   01/15/17 1230 01/15/17 1300 01/15/17 1330 01/15/17 1423  BP: 120/65 109/65 123/65 139/65  Pulse: 86 87 87 93  Resp:    18  Temp:    99.1 F (37.3 C)  TempSrc:    Axillary  SpO2: 95% 95% 95% 92%  Weight:    113 kg (249 lb 1.9 oz)  Height:    5\' 10"  (1.778 m)    Constitutional: NAD, calm, comfortable Vitals:   01/15/17 1230 01/15/17 1300 01/15/17 1330 01/15/17 1423  BP: 120/65 109/65 123/65 139/65  Pulse: 86 87 87 93  Resp:    18  Temp:    99.1 F (37.3 C)  TempSrc:    Axillary  SpO2: 95% 95% 95% 92%  Weight:    113 kg (249 lb 1.9 oz)  Height:    5\' 10"  (1.778 m)   Eyes: PERRL, lids and conjunctivae normal ENMT: Mucous membranes are moist. Posterior pharynx clear of any exudate or lesions.Normal dentition.  Neck: normal, supple, no masses, no thyromegaly Respiratory: clear to auscultation bilaterally, no wheezing, no crackles. Normal respiratory effort. No accessory muscle use.  Cardiovascular: Regular rate and rhythm, no murmurs / rubs / gallops. No extremity edema. 2+ pedal pulses. No  carotid bruits.  Abdomen: soft, diffusely tender but more tender in lower abdomen bilateraly, no masses palpated. No hepatosplenomegaly. Bowel sounds positive.  Musculoskeletal: no clubbing / cyanosis. No joint deformity upper and lower extremities. Good ROM, no contractures. Normal muscle tone.  Skin: no rashes, lesions, ulcers. No induration Neurologic: CN 2-12 grossly intact. Sensation intact, DTR normal. Strength 5/5 in all 4.  Psychiatric: Normal judgment and insight. Alert and oriented x 3. Normal mood.    Labs on Admission: I have personally reviewed following labs and imaging studies  CBC:  Recent Labs Lab 01/15/17 0910  WBC 11.2*  HGB 14.3  HCT  42.8  MCV 84.9  PLT 222   Basic Metabolic Panel:  Recent Labs Lab 01/15/17 0910  NA 136  K 4.2  CL 104  CO2 23  GLUCOSE 159*  BUN 32*  CREATININE 1.12  CALCIUM 9.0   GFR: Estimated Creatinine Clearance: 79.5 mL/min (by C-G formula based on SCr of 1.12 mg/dL). Liver Function Tests:  Recent Labs Lab 01/15/17 0910  AST 22  ALT 19  ALKPHOS 41  BILITOT 1.4*  PROT 7.7  ALBUMIN 4.0    Recent Labs Lab 01/15/17 0910  LIPASE 25   No results for input(s): AMMONIA in the last 168 hours. Coagulation Profile: No results for input(s): INR, PROTIME in the last 168 hours. Cardiac Enzymes: No results for input(s): CKTOTAL, CKMB, CKMBINDEX, TROPONINI in the last 168 hours. BNP (last 3 results) No results for input(s): PROBNP in the last 8760 hours. HbA1C: No results for input(s): HGBA1C in the last 72 hours. CBG: No results for input(s): GLUCAP in the last 168 hours. Lipid Profile: No results for input(s): CHOL, HDL, LDLCALC, TRIG, CHOLHDL, LDLDIRECT in the last 72 hours. Thyroid Function Tests: No results for input(s): TSH, T4TOTAL, FREET4, T3FREE, THYROIDAB in the last 72 hours. Anemia Panel: No results for input(s): VITAMINB12, FOLATE, FERRITIN, TIBC, IRON, RETICCTPCT in the last 72 hours. Urine analysis:      Component Value Date/Time   COLORURINE YELLOW 01/15/2017 0909   APPEARANCEUR CLEAR 01/15/2017 0909   LABSPEC >1.046 (H) 01/15/2017 0909   PHURINE 6.0 01/15/2017 0909   GLUCOSEU NEGATIVE 01/15/2017 0909   HGBUR NEGATIVE 01/15/2017 0909   BILIRUBINUR NEGATIVE 01/15/2017 0909   KETONESUR NEGATIVE 01/15/2017 0909   PROTEINUR 30 (A) 01/15/2017 0909   UROBILINOGEN 1.0 11/13/2010 1458   NITRITE NEGATIVE 01/15/2017 0909   LEUKOCYTESUR NEGATIVE 01/15/2017 0909    Radiological Exams on Admission: Ct Abdomen Pelvis W Contrast  Result Date: 01/15/2017 CLINICAL DATA:  Left side abdominal pain for 2 days.  Worsening. EXAM: CT ABDOMEN AND PELVIS WITH CONTRAST TECHNIQUE: Multidetector CT imaging of the abdomen and pelvis was performed using the standard protocol following bolus administration of intravenous contrast. CONTRAST:  115mL ISOVUE-300 IOPAMIDOL (ISOVUE-300) INJECTION 61% COMPARISON:  01/03/2009 FINDINGS: Lower chest: No acute abnormality. Hepatobiliary: Low attenuation of the liver as can be seen with hepatic steatosis. No focal liver abnormality is seen. No gallstones, gallbladder wall thickening, or biliary dilatation. Pancreas: Unremarkable. No pancreatic ductal dilatation or surrounding inflammatory changes. Spleen: Normal in size without focal abnormality. Adrenals/Urinary Tract: Adrenal glands are unremarkable. Small bilateral renal cysts. Kidneys are otherwise normal, without renal calculi, focal lesion, or hydronephrosis. Bladder is unremarkable. Stomach/Bowel: Diverticulosis of the descending colon and sigmoid colon. Bowel wall thickening involving the descending colon -sigmoid colon junction most consistent with diverticulitis. Small volume pneumoperitoneum most concerning for perforated diverticulitis. No pneumatosis or portal venous gas. Multiple air-fluid levels in the small bowel and proximal ascending colon as can be seen with an ileus. Vascular/Lymphatic: Mild abdominal aortic  atherosclerosis. No lymphadenopathy. Reproductive: Prostate is unremarkable. Other: Small amount of complex pelvic fluid likely reflecting small amount a hemorrhage verusus developing abscess. Musculoskeletal: Degenerative disc disease with disc height loss L3-4 and L5-S1. No acute osseous abnormality. IMPRESSION: 1. Acute perforated diverticulitis of the descending colon-sigmoid colon junction. Small amount of complex pelvic fluid likely reflecting small amount a hemorrhage verusus developing abscess. These results were called by telephone at the time of interpretation on 01/15/2017 at 11:09 am to [name] , who verbally acknowledged these results. Electronically Signed  By: Kathreen Devoid   On: 01/15/2017 11:24   Assessment/Plan Active Problems:   GERD (gastroesophageal reflux disease)   Diverticulitis   Perforated diverticulum   HTN (hypertension)   HLD (hyperlipidemia)    1. Perforated sigmoid diverticulitis. Continue the patient on Zosyn. Continue bowel rest with nothing by mouth status. General surgery following. May need to have surgical management if he does not improve. Check lactic acid. Continue on IV fluids. Continue pain management. 2. GERD. Continue on PPI 3. Hypertension. Hold Benicar until he is able to take medications by mouth. Will use hydralazine when necessary until then. 4. Hyperlipidemia. Hold statin and gemfibrozil until able to take by mouth.  DVT prophylaxis: lovenox Code Status: full code Family Communication: discussed with family at the bedside Disposition Plan: discharge home once improved Consults called: general surgery Admission status: inpatient, med/surg   Kathie Dike MD [facility] Pager [phone]  If 7PM-7AM, please contact night-coverage [email or web address] Password [facility]  01/15/2017, 4:51 PM

## 2017-01-15 NOTE — Consult Note (Signed)
Reason for Consult: Perforated sigmoid diverticulitis Referring Physician: ER, Dr. Satira Sark is an 71 y.o. male.  HPI: Patient is a 71 year old white male who presented emergency room with a 3 day history of worsening lower abdominal pain. He states he had a mild similar episode while ago and was told he had diverticulitis, but this was treated as an outpatient. He states his pain Baxter 19, but earlier today, it became so severe that he came to the emergency room for further evaluation treatment. He states this pain was 6 out of 10, though now it is only 3 out of 10. Mild nausea is noted, but no emesis is noted. He last had a bowel movement yesterday. He thought he was constipated. He states he did have chills overnight, but no fevers.  Past Medical History:  Diagnosis Date  . Anxiety   . 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 GERD (gastroesophageal reflux disease)   . Hx of adenomatous colonic polyps    tubulovillous adenoma  . Hypertension   . Pancytopenia    mild  . Pancytopenia 02/27/2011  . Restless leg syndrome   . Sleep apnea    wears CPAP/BIPAP every night  . Thyroid disease   . Viral syndrome    Hx  . Viral syndrome 02/27/2011    Past Surgical History:  Procedure Laterality Date  . COLONOSCOPY  01/02/2010   RMR: normal rectum and colon. marginal prep compromised exam.   . COLONOSCOPY N/A 03/15/2015   Procedure: COLONOSCOPY;  Surgeon: Daneil Dolin, MD;  [facility];  Service: Endoscopy;  Laterality: N/A;  1215 - moved to 12:45 - office to notify  . ESOPHAGOGASTRODUODENOSCOPY N/A 03/15/2015   Procedure: ESOPHAGOGASTRODUODENOSCOPY (EGD);  Surgeon: Daneil Dolin, MD;  [facility];  Service: Endoscopy;  Laterality: N/A;  . EYE SURGERY     bilateral cataracts  . GANGLION CYST EXCISION    . knee cartilage repair  01/31/11  . NASAL SINUS SURGERY    . 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 Cancer Brother        unknown type  . Cancer Sister        unknown type  . Colon cancer Neg Hx     Social History:  reports that he quit smoking about 35 years ago. His smoking use included Cigarettes. He has never used smokeless tobacco. He reports that he does not drink alcohol or use drugs.  Allergies: No Known Allergies  Medications: Scheduled:   Results for orders placed or performed during the hospital encounter of 01/15/17 (from the past 48 hour(s))  Urinalysis, Routine w reflex microscopic     Status: Abnormal   Collection Time: 01/15/17  9:09 AM  Result Value Ref Range   Color, Urine YELLOW YELLOW   APPearance CLEAR CLEAR   Specific Gravity, Urine >1.046 (H) 1.005 - 1.030   pH 6.0 5.0 - 8.0   Glucose, UA NEGATIVE NEGATIVE mg/dL   Hgb urine dipstick NEGATIVE NEGATIVE   Bilirubin Urine NEGATIVE NEGATIVE   Ketones, ur NEGATIVE NEGATIVE mg/dL   Protein, ur 30 (A) NEGATIVE mg/dL   Nitrite NEGATIVE NEGATIVE   Leukocytes, UA NEGATIVE NEGATIVE   RBC / HPF 0-5 0 - 5  RBC/hpf   WBC, UA 0-5 0 - 5 WBC/hpf   Bacteria, UA NONE SEEN NONE SEEN   Squamous Epithelial / LPF 0-5 (A) NONE SEEN   Mucous PRESENT   Lipase, blood     Status: None   Collection Time: 01/15/17  9:10 AM  Result Value Ref Range   Lipase 25 11 - 51 U/L  Comprehensive metabolic panel     Status: Abnormal   Collection Time: 01/15/17  9:10 AM  Result Value Ref Range   Sodium 136 135 - 145 mmol/L   Potassium 4.2 3.5 - 5.1 mmol/L   Chloride 104 101 - 111 mmol/L   CO2 23 22 - 32 mmol/L   Glucose, Bld 159 (H) 65 - 99 mg/dL   BUN 32 (H) 6 - 20 mg/dL   Creatinine, Ser 1.12 0.61 - 1.24 mg/dL   Calcium 9.0 8.9 - 10.3 mg/dL   Total Protein 7.7 6.5 - 8.1 g/dL   Albumin 4.0 3.5 - 5.0 g/dL   AST 22 15 - 41 U/L   ALT 19 17 - 63 U/L    Alkaline Phosphatase 41 38 - 126 U/L   Total Bilirubin 1.4 (H) 0.3 - 1.2 mg/dL   GFR calc non Af Amer >60 >60 mL/min   GFR calc Af Amer >60 >60 mL/min    Comment: (NOTE) The eGFR has been calculated using the CKD EPI equation. This calculation has not been validated in all clinical situations. eGFR's persistently <60 mL/min signify possible Chronic Kidney Disease.    Anion gap 9 5 - 15  CBC     Status: Abnormal   Collection Time: 01/15/17  9:10 AM  Result Value Ref Range   WBC 11.2 (H) 4.0 - 10.5 K/uL   RBC 5.04 4.22 - 5.81 MIL/uL   Hemoglobin 14.3 13.0 - 17.0 g/dL   HCT 42.8 39.0 - 52.0 %   MCV 84.9 78.0 - 100.0 fL   MCH 28.4 26.0 - 34.0 pg   MCHC 33.4 30.0 - 36.0 g/dL   RDW 15.0 11.5 - 15.5 %   Platelets 189 150 - 400 K/uL    Ct Abdomen Pelvis W Contrast  Result Date: 01/15/2017 CLINICAL DATA:  Left side abdominal pain for 2 days.  Worsening. EXAM: CT ABDOMEN AND PELVIS WITH CONTRAST TECHNIQUE: Multidetector CT imaging of the abdomen and pelvis was performed using the standard protocol following bolus administration of intravenous contrast. CONTRAST:  169m ISOVUE-300 IOPAMIDOL (ISOVUE-300) INJECTION 61% COMPARISON:  01/03/2009 FINDINGS: Lower chest: No acute abnormality. Hepatobiliary: Low attenuation of the liver as can be seen with hepatic steatosis. No focal liver abnormality is seen. No gallstones, gallbladder wall thickening, or biliary dilatation. Pancreas: Unremarkable. No pancreatic ductal dilatation or surrounding inflammatory changes. Spleen: Normal in size without focal abnormality. Adrenals/Urinary Tract: Adrenal glands are unremarkable. Small bilateral renal cysts. Kidneys are otherwise normal, without renal calculi, focal lesion, or hydronephrosis. Bladder is unremarkable. Stomach/Bowel: Diverticulosis of the descending colon and sigmoid colon. Bowel wall thickening involving the descending colon -sigmoid colon junction most consistent with diverticulitis. Small volume  pneumoperitoneum most concerning for perforated diverticulitis. No pneumatosis or portal venous gas. Multiple air-fluid levels in the small bowel and proximal ascending colon as can be seen with an ileus. Vascular/Lymphatic: Mild abdominal aortic atherosclerosis. No lymphadenopathy. Reproductive: Prostate is unremarkable. Other: Small amount of complex pelvic fluid likely reflecting small amount a hemorrhage verusus developing abscess. Musculoskeletal: Degenerative disc disease with disc height loss L3-4 and L5-S1. No acute osseous abnormality. IMPRESSION: 1.  Acute perforated diverticulitis of the descending colon-sigmoid colon junction. Small amount of complex pelvic fluid likely reflecting small amount a hemorrhage verusus developing abscess. These results were called by telephone at the time of interpretation on 01/15/2017 at 11:09 am to Winfred , who verbally acknowledged these results. Electronically Signed   By: Kathreen Devoid   On: 01/15/2017 11:24    ROS:  Pertinent items are noted in HPI.  Blood pressure 139/65, pulse 93, temperature 99.1 F (37.3 C), temperature source Axillary, resp. rate 18, height 5' 10"  (1.778 m), weight 249 lb 1.9 oz (113 kg), SpO2 92 %. Physical Exam: Pleasant well-developed well-nourished white male who is slightly anxious.  Head is normocephalic, atraumatic Neck is supple without lymphadenopathy Lungs clear auscultation with breath sounds bilaterally Heart examination reveals a regular rate and rhythm without S3, S4, murmurs Abdomen is soft with tenderness noted to palpation in the left lower quadrant and suprapubic region. No rigidity is noted. No hernias are noted.  CT scan images personally reviewed   Assessment/Plan: Impression: Perforated sigmoid diverticulitis. Patient does not have diffuse peritonitis or rigid abdomen requiring emergent operative intervention  Plan: Will admit to the hospital for IV antibiotics and bowel rest. We will monitor closely.  Should his condition worsen, he will need a partial colectomy with colostomy. I did explain this to the patient. He understands and agrees. Will follow closely with you.  Aviva Signs 01/15/2017, 3:24 PM

## 2017-01-15 NOTE — ED Provider Notes (Signed)
[facility] DEPT Provider Note   CSN: [id number] Arrival date & time: 01/15/17  0857     History   Chief Complaint Chief Complaint  Patient presents with  . Abdominal Pain    HPI WILFERD RITSON is a 71 y.o. male.  HPI   ETHELBERT THAIN is a 71 y.o. male who presents to the Emergency Department complaining of gradually worsening lower abdominal pain for 2 days.  States that he noticed a dull pain to his left lower abdomen 2 days ago that resolved and returned and has now radiated across the lower abdomen.  He has not had a BM in 2 days, took a laxative last evening and had a non-bloody  "blow out" this morning but pain was unrelieved.  Describes as sharp pains that are constant.  He denies vomiting or nausea, chest pain, shortness of breath, fever, and urinary symptoms.  No previous abdominal surgeries.     Past Medical History:  Diagnosis Date  . Anxiety   . 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 GERD (gastroesophageal reflux disease)   . Hx of adenomatous colonic polyps    tubulovillous adenoma  . Hypertension   . Pancytopenia    mild  . Pancytopenia 02/27/2011  . Restless leg syndrome   . Sleep apnea    wears CPAP/BIPAP every night  . Thyroid disease   . Viral syndrome    Hx  . Viral syndrome 02/27/2011    Patient Active Problem List   Diagnosis Date Noted  . 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 History of colonic polyps   . Diverticulosis of colon without hemorrhage   . Hiatal hernia   . Hx of adenomatous colonic polyps 01/11/2015  . GERD (gastroesophageal reflux disease) 01/11/2015  . Constipation 01/11/2015  . Restless leg syndrome 01/07/2013  . Unspecified hereditary and idiopathic peripheral neuropathy 01/07/2013  . Mild cognitive impairment 01/07/2013  . Pancytopenia 02/27/2011  . Viral syndrome 02/27/2011    Past Surgical History:  Procedure Laterality Date  . COLONOSCOPY  01/02/2010   RMR: normal  rectum and colon. marginal prep compromised exam.   . COLONOSCOPY N/A 03/15/2015   Procedure: COLONOSCOPY;  Surgeon: Daneil Dolin, MD;  Location: [facility];  Service: Endoscopy;  Laterality: N/A;  1215 - moved to 12:45 - office to notify  . ESOPHAGOGASTRODUODENOSCOPY N/A 03/15/2015   Procedure: ESOPHAGOGASTRODUODENOSCOPY (EGD);  Surgeon: Daneil Dolin, MD;  Location: [facility];  Service: Endoscopy;  Laterality: N/A;  . EYE SURGERY     bilateral cataracts  . GANGLION CYST EXCISION    . knee cartilage repair  01/31/11  . NASAL SINUS SURGERY    . TOTAL KNEE ARTHROPLASTY Right 10/03/2016   Procedure: RIGHT TOTAL KNEE ARTHROPLASTY;  Surgeon: Sydnee Cabal, MD;  Location: [facility];  Service: Orthopedics;  Laterality: Right;       Home Medications    Prior to Admission medications   Medication Sig Start Date End Date Taking? Authorizing Provider  aspirin EC 325 MG tablet Take 1 tablet (325 mg total) by mouth 2 (two) times daily. 10/03/16   Stilwell, Bryson L, PA-C  Coenzyme Q10 200 MG capsule Take 200 mg by mouth every evening.    [provider]  docusate sodium (COLACE) 100 MG capsule Take 200 mg by mouth daily.    [provider]  gabapentin (NEURONTIN) 300 MG capsule Take 600 mg by mouth at bedtime.  [provider]  gemfibrozil (LOPID) 600 MG tablet Take 600 mg by mouth 2 (two) times daily. 08/24/16   [provider]  methocarbamol (ROBAXIN) 500 MG tablet Take 1 tablet (500 mg total) by mouth every 8 (eight) hours as needed for muscle spasms. 10/03/16   Stilwell, Sueanne Margarita, PA-C  Multiple Vitamin (MULTIVITAMIN WITH MINERALS) TABS tablet Take 1 tablet by mouth daily. Centrum Silver    [provider]  olmesartan (BENICAR) 40 MG tablet Take 40 mg by mouth daily. 07/26/15   [provider]  Omega-3 Fatty Acids (FISH OIL) 1200 MG CAPS Take 1,200-2,400 mg by mouth 2 (two) times daily. 2400 mg in the morning & 1200 mg in the evening.     [provider]  omeprazole (PRILOSEC) 20 MG capsule Take 20 mg by mouth 2 (two) times daily.      [provider]  oxyCODONE (ROXICODONE) 5 MG immediate release tablet Take 1-2 tablets (5-10 mg total) by mouth every 4 (four) hours as needed for severe pain. 10/03/16   Stilwell, Bryson L, PA-C  pravastatin (PRAVACHOL) 20 MG tablet Take 20 mg by mouth every evening.     [provider]    Family History Family History  Problem Relation Age of Onset  . Cancer Brother        unknown type  . Cancer Sister        unknown type  . Colon cancer Neg Hx     Social History Social History  Substance Use Topics  . Smoking status: Former Smoker    Types: Cigarettes    Quit date: 05/22/1981  . Smokeless tobacco: Never Used  . Alcohol use No     Allergies   Patient has no known allergies.   Review of Systems Review of Systems  Constitutional: Positive for chills. Negative for appetite change and fever.  Respiratory: Negative for shortness of breath.   Cardiovascular: Negative for chest pain.  Gastrointestinal: Positive for abdominal pain and constipation. Negative for blood in stool, nausea and vomiting.  Genitourinary: Negative for decreased urine volume, difficulty urinating, dysuria and flank pain.  Musculoskeletal: Negative for back pain.  Skin: Negative for color change and rash.  Neurological: Negative for dizziness, weakness and numbness.  Hematological: Negative for adenopathy.  All other systems reviewed and are negative.    Physical Exam Updated Vital Signs BP 133/72   Pulse 90   Temp 99.2 F (37.3 C) (Oral)   Resp (!) 22   Ht 5\' 10"  (1.778 m)   Wt 113.4 kg (250 lb)   SpO2 93%   BMI 35.87 kg/m   Physical Exam  Constitutional: He is oriented to person, place, and time. He appears well-developed and well-nourished. He appears distressed.  Pt appears uncomfortable.   HENT:  Head: Normocephalic and atraumatic.  Mouth/Throat: Oropharynx  is clear and moist.  Neck: Normal range of motion.  Cardiovascular: Normal rate, regular rhythm, normal heart sounds and intact distal pulses.   No murmur heard. Pulmonary/Chest: Effort normal and breath sounds normal. No respiratory distress.  Abdominal: Soft. Bowel sounds are normal. He exhibits no distension and no mass. There is tenderness. There is no rebound and no guarding.  Diffuse ttp of the lower abdomen.  Hyperactive bowel sounds. Mildly distended.    Musculoskeletal: Normal range of motion. He exhibits no edema.  Neurological: He is alert and oriented to person, place, and time. He exhibits normal muscle tone. Coordination normal.  Skin: Skin is warm and dry.  Psychiatric: He has a normal mood and affect.  Nursing note and vitals reviewed.    ED Treatments / Results  Labs (all labs ordered are listed, but only abnormal results are displayed) Labs Reviewed  COMPREHENSIVE METABOLIC PANEL - Abnormal; Notable for the following:       Result Value   Glucose, Bld 159 (*)    BUN 32 (*)    Total Bilirubin 1.4 (*)    All other components within normal limits  CBC - Abnormal; Notable for the following:    WBC 11.2 (*)    All other components within normal limits  URINALYSIS, ROUTINE W REFLEX MICROSCOPIC - Abnormal; Notable for the following:    Specific Gravity, Urine >1.046 (*)    Protein, ur 30 (*)    Squamous Epithelial / LPF 0-5 (*)    All other components within normal limits  LIPASE, BLOOD    EKG  EKG Interpretation None       Radiology Ct Abdomen Pelvis W Contrast  Result Date: 01/15/2017 CLINICAL DATA:  Left side abdominal pain for 2 days.  Worsening. EXAM: CT ABDOMEN AND PELVIS WITH CONTRAST TECHNIQUE: Multidetector CT imaging of the abdomen and pelvis was performed using the standard protocol following bolus administration of intravenous contrast. CONTRAST:  129mL ISOVUE-300 IOPAMIDOL (ISOVUE-300) INJECTION 61% COMPARISON:  01/03/2009 FINDINGS: Lower chest:  No acute abnormality. Hepatobiliary: Low attenuation of the liver as can be seen with hepatic steatosis. No focal liver abnormality is seen. No gallstones, gallbladder wall thickening, or biliary dilatation. Pancreas: Unremarkable. No pancreatic ductal dilatation or surrounding inflammatory changes. Spleen: Normal in size without focal abnormality. Adrenals/Urinary Tract: Adrenal glands are unremarkable. Small bilateral renal cysts. Kidneys are otherwise normal, without renal calculi, focal lesion, or hydronephrosis. Bladder is unremarkable. Stomach/Bowel: Diverticulosis of the descending colon and sigmoid colon. Bowel wall thickening involving the descending colon -sigmoid colon junction most consistent with diverticulitis. Small volume pneumoperitoneum most concerning for perforated diverticulitis. No pneumatosis or portal venous gas. Multiple air-fluid levels in the small bowel and proximal ascending colon as can be seen with an ileus. Vascular/Lymphatic: Mild abdominal aortic atherosclerosis. No lymphadenopathy. Reproductive: Prostate is unremarkable. Other: Small amount of complex pelvic fluid likely reflecting small amount a hemorrhage verusus developing abscess. Musculoskeletal: Degenerative disc disease with disc height loss L3-4 and L5-S1. No acute osseous abnormality. IMPRESSION: 1. Acute perforated diverticulitis of the descending colon-sigmoid colon junction. Small amount of complex pelvic fluid likely reflecting small amount a hemorrhage verusus developing abscess. These results were called by telephone at the time of interpretation on 01/15/2017 at 11:09 am to [place] , who verbally acknowledged these results. Electronically Signed   By: Kathreen Devoid   On: 01/15/2017 11:24    Procedures Procedures (including critical care time)  Medications Ordered in ED Medications  HYDROmorphone (DILAUDID) injection 1 mg (1 mg Intravenous Given 01/15/17 0924)  ondansetron (ZOFRAN) injection 4 mg (4 mg  Intravenous Given 01/15/17 0924)     Initial Impression / Assessment and Plan / ED Course  I have reviewed the triage vital signs and the nursing notes.  Pertinent labs & imaging results that were available during my care of the patient were reviewed by me and considered in my medical decision making (see chart for details).     [place] Arnoldo Morale. Will see pt in ED.  Zosyn ordered.  Pt NPO.  Last meal at 7:00 pm  1140  Additional pain medication ordered.  Pt feeling better.  Abdomen distended, but  soft.    1155  Dr. Arnoldo Morale called back, requesting to have hospitalist admit and he will consult.    [place] hospitalist, Dr. Roderic Palau who agrees to admit.    Final Clinical Impressions(s) / ED Diagnoses   Final diagnoses:  Perforated diverticulum    New Prescriptions New Prescriptions   No medications on file     Bufford Lope 01/15/17 1216    Nat Christen, MD 01/16/17 1843

## 2017-01-15 NOTE — Progress Notes (Signed)
Pharmacy Antibiotic Note  Dakota Thompson is a 71 y.o. male admitted on 01/15/2017 with intraabdominal infection.  Pharmacy has been consulted for Zosyn  dosing.  Plan: Zosyn 3.375g IV q8h (4 hour infusion).  Labs per protocol  Height: 5\' 10"  (177.8 cm) Weight: 249 lb 1.9 oz (113 kg) IBW/kg (Calculated) : 73  Temp (24hrs), Avg:99.2 F (37.3 C), Min:99.1 F (37.3 C), Max:99.3 F (37.4 C)   Recent Labs Lab 01/15/17 0910  WBC 11.2*  CREATININE 1.12    Estimated Creatinine Clearance: 79.5 mL/min (by C-G formula based on SCr of 1.12 mg/dL).    No Known Allergies  Antimicrobials this admission: Zosyn  7/26 >>     Thank you for allowing pharmacy to be a part of this patient's care.  Chriss Czar 01/15/2017 4:54 PM

## 2017-01-16 LAB — CBC
HCT: 42.2 % (ref 39.0–52.0)
HEMOGLOBIN: 13.5 g/dL (ref 13.0–17.0)
MCH: 27.9 pg (ref 26.0–34.0)
MCHC: 32 g/dL (ref 30.0–36.0)
MCV: 87.2 fL (ref 78.0–100.0)
Platelets: 188 10*3/uL (ref 150–400)
RBC: 4.84 MIL/uL (ref 4.22–5.81)
RDW: 15.7 % — ABNORMAL HIGH (ref 11.5–15.5)
WBC: 10.9 10*3/uL — ABNORMAL HIGH (ref 4.0–10.5)

## 2017-01-16 LAB — COMPREHENSIVE METABOLIC PANEL
ALK PHOS: 38 U/L (ref 38–126)
ALT: 15 U/L — AB (ref 17–63)
ANION GAP: 9 (ref 5–15)
AST: 17 U/L (ref 15–41)
Albumin: 3.7 g/dL (ref 3.5–5.0)
BUN: 29 mg/dL — ABNORMAL HIGH (ref 6–20)
CHLORIDE: 103 mmol/L (ref 101–111)
CO2: 25 mmol/L (ref 22–32)
CREATININE: 1.21 mg/dL (ref 0.61–1.24)
Calcium: 8.6 mg/dL — ABNORMAL LOW (ref 8.9–10.3)
GFR calc non Af Amer: 60 mL/min — ABNORMAL LOW (ref >60)
GLUCOSE: 112 mg/dL — AB (ref 65–99)
Potassium: 4.1 mmol/L (ref 3.5–5.1)
SODIUM: 137 mmol/L (ref 135–145)
Total Bilirubin: 1.8 mg/dL — ABNORMAL HIGH (ref 0.3–1.2)
Total Protein: 7.3 g/dL (ref 6.5–8.1)

## 2017-01-16 MED ORDER — GI COCKTAIL ~~LOC~~
30.0000 mL | Freq: Three times a day (TID) | ORAL | Status: DC | PRN
  Administered 2017-01-16 – 2017-01-17 (×3): 30 mL via ORAL
  Filled 2017-01-16 (×3): qty 30

## 2017-01-16 NOTE — Progress Notes (Signed)
Oxygen saturation is low on room air, saturation 88 on room air , patient demonstrates dyspnea on walking. Placed on 3 lpm nasal cannula and added 3 lpm to BiPAP home machine while he sleeping.

## 2017-01-16 NOTE — Progress Notes (Signed)
Subjective: Patient states he feels better today. He still has left lower quadrant abdominal pain.  Objective: Vital signs in last 24 hours: Temp:  [99.1 F (37.3 C)-100.8 F (38.2 C)] 100.8 F (38.2 C) (07/27 0500) Pulse Rate:  [86-103] 103 (07/27 0500) Resp:  [18-20] 20 (07/27 0500) BP: (104-139)/(49-76) 116/76 (07/27 0500) SpO2:  [86 %-95 %] 92 % (07/27 0500) Weight:  [249 lb 1.9 oz (113 kg)] 249 lb 1.9 oz (113 kg) (07/26 1423) Last BM Date: 01/15/17  Intake/Output from previous day: 07/26 0701 - 07/27 0700 In: 801.7 [I.V.:651.7; IV Piggyback:150] Out: -  Intake/Output this shift: No intake/output data recorded.  General appearance: alert, cooperative and no distress GI: Soft with mild tenderness to palpation in left lower quadrant. No rigidity is noted. Bowel sounds are absent. Not significantly distended.  Lab Results:   Recent Labs  01/15/17 0910 01/16/17 0547  WBC 11.2* 10.9*  HGB 14.3 13.5  HCT 42.8 42.2  PLT 189 188   BMET  Recent Labs  01/15/17 0910 01/16/17 0547  NA 136 137  K 4.2 4.1  CL 104 103  CO2 23 25  GLUCOSE 159* 112*  BUN 32* 29*  CREATININE 1.12 1.21  CALCIUM 9.0 8.6*   PT/INR No results for input(s): LABPROT, INR in the last 72 hours.  Studies/Results: Ct Abdomen Pelvis W Contrast  Result Date: 01/15/2017 CLINICAL DATA:  Left side abdominal pain for 2 days.  Worsening. EXAM: CT ABDOMEN AND PELVIS WITH CONTRAST TECHNIQUE: Multidetector CT imaging of the abdomen and pelvis was performed using the standard protocol following bolus administration of intravenous contrast. CONTRAST:  147mL ISOVUE-300 IOPAMIDOL (ISOVUE-300) INJECTION 61% COMPARISON:  01/03/2009 FINDINGS: Lower chest: No acute abnormality. Hepatobiliary: Low attenuation of the liver as can be seen with hepatic steatosis. No focal liver abnormality is seen. No gallstones, gallbladder wall thickening, or biliary dilatation. Pancreas: Unremarkable. No pancreatic ductal  dilatation or surrounding inflammatory changes. Spleen: Normal in size without focal abnormality. Adrenals/Urinary Tract: Adrenal glands are unremarkable. Small bilateral renal cysts. Kidneys are otherwise normal, without renal calculi, focal lesion, or hydronephrosis. Bladder is unremarkable. Stomach/Bowel: Diverticulosis of the descending colon and sigmoid colon. Bowel wall thickening involving the descending colon -sigmoid colon junction most consistent with diverticulitis. Small volume pneumoperitoneum most concerning for perforated diverticulitis. No pneumatosis or portal venous gas. Multiple air-fluid levels in the small bowel and proximal ascending colon as can be seen with an ileus. Vascular/Lymphatic: Mild abdominal aortic atherosclerosis. No lymphadenopathy. Reproductive: Prostate is unremarkable. Other: Small amount of complex pelvic fluid likely reflecting small amount a hemorrhage verusus developing abscess. Musculoskeletal: Degenerative disc disease with disc height loss L3-4 and L5-S1. No acute osseous abnormality. IMPRESSION: 1. Acute perforated diverticulitis of the descending colon-sigmoid colon junction. Small amount of complex pelvic fluid likely reflecting small amount a hemorrhage verusus developing abscess. These results were called by telephone at the time of interpretation on 01/15/2017 at 11:09 am to [place] , who verbally acknowledged these results. Electronically Signed   By: Kathreen Devoid   On: 01/15/2017 11:24    Anti-infectives: Anti-infectives    Start     Dose/Rate Route Frequency Ordered Stop   01/15/17 2000  piperacillin-tazobactam (ZOSYN) IVPB 3.375 g     3.375 g 12.5 mL/hr over 240 Minutes Intravenous Every 8 hours 01/15/17 1654     01/15/17 1145  piperacillin-tazobactam (ZOSYN) IVPB 3.375 g     3.375 g 100 mL/hr over 30 Minutes Intravenous  Once 01/15/17 1130 01/15/17 1231  Assessment/Plan: Impression: Sigmoid diverticulitis with perforation, improved.  No need for acute surgical intervention at this time. Plan: Continue current medical therapy.  LOS: 1 day    [place]iva Signs 01/16/2017

## 2017-01-16 NOTE — Progress Notes (Signed)
PROGRESS NOTE    Dakota Thompson  ASN:[id number] DOB: 1946-01-14 DOA: 01/15/2017 PCP: Redmond School, MD   Brief Narrative:  71 year old male admitted with abdominal pain. Found to have perforated sigmoid diverticulitis. Started on intravenous antibiotics. General surgery following.   Assessment & Plan:   Active Problems:   GERD (gastroesophageal reflux disease)   Diverticulitis   Perforated diverticulum   HTN (hypertension)   HLD (hyperlipidemia)   1. Perforated sigmoid diverticulitis. Patient is feeling mildly better today but still had low-grade fevers overnight. Continue the patient on Zosyn. Discussed with general surgery and okay to start on sips of clears. May need to have surgical management if he does not improve. Continue on IV fluids. Continue pain management. 2. GERD. Continue on PPI 3. Hypertension. Hold Benicar until he is able to take medications by mouth. Will use hydralazine when necessary until then. Blood pressure has been stable 4. Hyperlipidemia. Hold statin and gemfibrozil until able to take by mouth.   DVT prophylaxis: Lovenox Code Status: Full code Family Communication: Discussed with daughter at the bedside Disposition Plan: Discharge home once improved   Consultants:   Gen. surgery  Procedures:     Antimicrobials:   Zosyn 7/26>>    Subjective: Still having abdominal pain, but overall feels a little better than yesterday. No vomiting.  Objective: Vitals:   01/15/17 2025 01/15/17 2046 01/16/17 0500 01/16/17 1503  BP: (!) 105/49  116/76   Pulse: (!) 101  (!) 103 (!) 103  Resp: 20  20 18   Temp: (!) 100.4 F (38 C)  (!) 100.8 F (38.2 C) 98.8 F (37.1 C)  TempSrc: Oral  Axillary Oral  SpO2: 95% (!) 86% 92% 90%  Weight:      Height:        Intake/Output Summary (Last 24 hours) at 01/16/17 1738 Last data filed at 01/16/17 0500  Gross per 24 hour  Intake           751.67 ml  Output                0 ml  Net           751.67 ml    Filed Weights   01/15/17 0904 01/15/17 1423  Weight: 113.4 kg (250 lb) 113 kg (249 lb 1.9 oz)    Examination:  General exam: Appears calm and comfortable  Respiratory system: Clear to auscultation. Respiratory effort normal. Cardiovascular system: S1 & S2 heard, RRR. No JVD, murmurs, rubs, gallops or clicks. No pedal edema. Gastrointestinal system: Abdomen is nondistended, soft and diffusely tender. No organomegaly or masses felt. Normal bowel sounds heard. Central nervous system: Alert and oriented. No focal neurological deficits. Extremities: Symmetric 5 x 5 power. Skin: No rashes, lesions or ulcers Psychiatry: Judgement and insight appear normal. Mood & affect appropriate.     Data Reviewed: I have personally reviewed following labs and imaging studies  CBC:  Recent Labs Lab 01/15/17 0910 01/16/17 0547  WBC 11.2* 10.9*  HGB 14.3 13.5  HCT 42.8 42.2  MCV 84.9 87.2  PLT 189 193   Basic Metabolic Panel:  Recent Labs Lab 01/15/17 0910 01/16/17 0547  NA 136 137  K 4.2 4.1  CL 104 103  CO2 23 25  GLUCOSE 159* 112*  BUN 32* 29*  CREATININE 1.12 1.21  CALCIUM 9.0 8.6*   GFR: Estimated Creatinine Clearance: 73.6 mL/min (by C-G formula based on SCr of 1.21 mg/dL). Liver Function Tests:  Recent Labs Lab 01/15/17 0910 01/16/17 0547  AST 22 17  ALT 19 15*  ALKPHOS 41 38  BILITOT 1.4* 1.8*  PROT 7.7 7.3  ALBUMIN 4.0 3.7    Recent Labs Lab 01/15/17 0910  LIPASE 25   No results for input(s): AMMONIA in the last 168 hours. Coagulation Profile: No results for input(s): INR, PROTIME in the last 168 hours. Cardiac Enzymes: No results for input(s): CKTOTAL, CKMB, CKMBINDEX, TROPONINI in the last 168 hours. BNP (last 3 results) No results for input(s): PROBNP in the last 8760 hours. HbA1C: No results for input(s): HGBA1C in the last 72 hours. CBG: No results for input(s): GLUCAP in the last 168 hours. Lipid Profile: No results for input(s): CHOL, HDL,  LDLCALC, TRIG, CHOLHDL, LDLDIRECT in the last 72 hours. Thyroid Function Tests: No results for input(s): TSH, T4TOTAL, FREET4, T3FREE, THYROIDAB in the last 72 hours. Anemia Panel: No results for input(s): VITAMINB12, FOLATE, FERRITIN, TIBC, IRON, RETICCTPCT in the last 72 hours. Sepsis Labs:  Recent Labs Lab 01/15/17 1712 01/15/17 2038  LATICACIDVEN 1.1 1.0    No results found for this or any previous visit (from the past 240 hour(s)).       Radiology Studies: Ct Abdomen Pelvis W Contrast  Result Date: 01/15/2017 CLINICAL DATA:  Left side abdominal pain for 2 days.  Worsening. EXAM: CT ABDOMEN AND PELVIS WITH CONTRAST TECHNIQUE: Multidetector CT imaging of the abdomen and pelvis was performed using the standard protocol following bolus administration of intravenous contrast. CONTRAST:  142mL ISOVUE-300 IOPAMIDOL (ISOVUE-300) INJECTION 61% COMPARISON:  01/03/2009 FINDINGS: Lower chest: No acute abnormality. Hepatobiliary: Low attenuation of the liver as can be seen with hepatic steatosis. No focal liver abnormality is seen. No gallstones, gallbladder wall thickening, or biliary dilatation. Pancreas: Unremarkable. No pancreatic ductal dilatation or surrounding inflammatory changes. Spleen: Normal in size without focal abnormality. Adrenals/Urinary Tract: Adrenal glands are unremarkable. Small bilateral renal cysts. Kidneys are otherwise normal, without renal calculi, focal lesion, or hydronephrosis. Bladder is unremarkable. Stomach/Bowel: Diverticulosis of the descending colon and sigmoid colon. Bowel wall thickening involving the descending colon -sigmoid colon junction most consistent with diverticulitis. Small volume pneumoperitoneum most concerning for perforated diverticulitis. No pneumatosis or portal venous gas. Multiple air-fluid levels in the small bowel and proximal ascending colon as can be seen with an ileus. Vascular/Lymphatic: Mild abdominal aortic atherosclerosis. No  lymphadenopathy. Reproductive: Prostate is unremarkable. Other: Small amount of complex pelvic fluid likely reflecting small amount a hemorrhage verusus developing abscess. Musculoskeletal: Degenerative disc disease with disc height loss L3-4 and L5-S1. No acute osseous abnormality. IMPRESSION: 1. Acute perforated diverticulitis of the descending colon-sigmoid colon junction. Small amount of complex pelvic fluid likely reflecting small amount a hemorrhage verusus developing abscess. These results were called by telephone at the time of interpretation on 01/15/2017 at 11:09 am to [place] , who verbally acknowledged these results. Electronically Signed   By: Kathreen Devoid   On: 01/15/2017 11:24        Scheduled Meds: . enoxaparin (LOVENOX) injection  40 mg Subcutaneous Q24H  . pantoprazole (PROTONIX) IV  40 mg Intravenous Q24H   Continuous Infusions: . sodium chloride 100 mL/hr at 01/16/17 1150  . methocarbamol (ROBAXIN)  IV    . piperacillin-tazobactam (ZOSYN)  IV 3.375 g (01/16/17 1357)     LOS: 1 day    Time spent: 58mins    Brandy Zuba, MD [facility] Pager [phone]  If 7PM-7AM, please contact night-coverage [email or web address] Password [facility] 01/16/2017, 5:38 PM

## 2017-01-17 ENCOUNTER — Inpatient Hospital Stay (HOSPITAL_COMMUNITY): Payer: Medicare Other

## 2017-01-17 LAB — BASIC METABOLIC PANEL
Anion gap: 9 (ref 5–15)
BUN: 27 mg/dL — AB (ref 6–20)
CALCIUM: 8.7 mg/dL — AB (ref 8.9–10.3)
CHLORIDE: 99 mmol/L — AB (ref 101–111)
CO2: 26 mmol/L (ref 22–32)
CREATININE: 1.08 mg/dL (ref 0.61–1.24)
Glucose, Bld: 124 mg/dL — ABNORMAL HIGH (ref 65–99)
Potassium: 4.1 mmol/L (ref 3.5–5.1)
SODIUM: 134 mmol/L — AB (ref 135–145)

## 2017-01-17 LAB — CBC
HCT: 40.3 % (ref 39.0–52.0)
Hemoglobin: 12.9 g/dL — ABNORMAL LOW (ref 13.0–17.0)
MCH: 27.7 pg (ref 26.0–34.0)
MCHC: 32 g/dL (ref 30.0–36.0)
MCV: 86.5 fL (ref 78.0–100.0)
Platelets: 180 10*3/uL (ref 150–400)
RBC: 4.66 MIL/uL (ref 4.22–5.81)
RDW: 15.5 % (ref 11.5–15.5)
WBC: 9.9 10*3/uL (ref 4.0–10.5)

## 2017-01-17 MED ORDER — LORAZEPAM 2 MG/ML IJ SOLN
1.0000 mg | INTRAMUSCULAR | Status: DC | PRN
  Administered 2017-01-17 – 2017-01-19 (×2): 1 mg via INTRAVENOUS
  Filled 2017-01-17 (×2): qty 1

## 2017-01-17 MED ORDER — PANTOPRAZOLE SODIUM 40 MG PO TBEC
40.0000 mg | DELAYED_RELEASE_TABLET | Freq: Every day | ORAL | Status: DC
  Administered 2017-01-17 – 2017-01-20 (×4): 40 mg via ORAL
  Filled 2017-01-17 (×4): qty 1

## 2017-01-17 MED ORDER — FUROSEMIDE 10 MG/ML IJ SOLN
20.0000 mg | Freq: Once | INTRAMUSCULAR | Status: AC
  Administered 2017-01-17: 20 mg via INTRAVENOUS
  Filled 2017-01-17: qty 2

## 2017-01-17 NOTE — Progress Notes (Signed)
PROGRESS NOTE    Dakota Thompson  VFI:[id number] DOB: [birth year] DOA: 01/15/2017 PCP: Redmond School, MD   Brief Narrative:  71 year old male admitted with abdominal pain. Found to have perforated sigmoid diverticulitis. Started on intravenous antibiotics. General surgery following.   Assessment & Plan:   Active Problems:   GERD (gastroesophageal reflux disease)   Diverticulitis   Perforated diverticulum   HTN (hypertension)   HLD (hyperlipidemia)   1. Perforated sigmoid diverticulitis. Patient continues to have slow progress. He feels mildly better today, but is still in pain requiring IV pain medicine. Overall fevers appear to have improved. Leukocytosis has resolved. Continue the patient on Zosyn. May need to have surgical management if he does not improve. We'll hold IV fluids since he is starting to develop some volume overload. Continue pain management. 2. GERD. Continue on PPI 3. Hypertension. Hold Benicar until he is able to take medications by mouth. Will use hydralazine when necessary until then. Blood pressure has been stable 4. Hyperlipidemia. Hold statin and gemfibrozil until able to take by mouth.   DVT prophylaxis: Lovenox Code Status: Full code Family Communication: Discussed with daughter at the bedside Disposition Plan: Discharge home once improved   Consultants:   Gen. surgery  Procedures:     Antimicrobials:   Zosyn 7/26>>    Subjective: No bowel movements yet. Overall feels mildly better than yesterday. Still having significant pain.  Objective: Vitals:   01/16/17 2137 01/17/17 0655 01/17/17 0748 01/17/17 1341  BP: (!) 141/71 119/67  104/63  Pulse: (!) 101 91  94  Resp: 20 18  18   Temp: 99.3 F (37.4 C) 98.3 F (36.8 C)  97.6 F (36.4 C)  TempSrc: Oral Oral  Oral  SpO2: 95% 94% 93% 97%  Weight:      Height:       No intake or output data in the 24 hours ending 01/17/17 1809 Filed Weights   01/15/17 0904 01/15/17 1423  Weight:  113.4 kg (250 lb) 113 kg (249 lb 1.9 oz)    Examination:  General exam: Appears calm and comfortable  Respiratory system: Clear to auscultation. Respiratory effort normal. Cardiovascular system: S1 & S2 heard, RRR. No JVD, murmurs, rubs, gallops or clicks. No pedal edema. Gastrointestinal system: Abdomen is nondistended, soft and diffusely tender. No organomegaly or masses felt. Normal bowel sounds heard. Central nervous system: Alert and oriented. No focal neurological deficits. Extremities: Symmetric 5 x 5 power. Skin: No rashes, lesions or ulcers Psychiatry: Judgement and insight appear normal. Mood & affect appropriate.     Data Reviewed: I have personally reviewed following labs and imaging studies  CBC:  Recent Labs Lab 01/15/17 0910 01/16/17 0547 01/17/17 0651  WBC 11.2* 10.9* 9.9  HGB 14.3 13.5 12.9*  HCT 42.8 42.2 40.3  MCV 84.9 87.2 86.5  PLT 189 188 416   Basic Metabolic Panel:  Recent Labs Lab 01/15/17 0910 01/16/17 0547 01/17/17 0651  NA 136 137 134*  K 4.2 4.1 4.1  CL 104 103 99*  CO2 23 25 26   GLUCOSE 159* 112* 124*  BUN 32* 29* 27*  CREATININE 1.12 1.21 1.08  CALCIUM 9.0 8.6* 8.7*   GFR: Estimated Creatinine Clearance: 81.3 mL/min (by C-G formula based on SCr of 1.08 mg/dL). Liver Function Tests:  Recent Labs Lab 01/15/17 0910 01/16/17 0547  AST 22 17  ALT 19 15*  ALKPHOS 41 38  BILITOT 1.4* 1.8*  PROT 7.7 7.3  ALBUMIN 4.0 3.7    Recent Labs Lab 01/15/17  0910  LIPASE 25   No results for input(s): AMMONIA in the last 168 hours. Coagulation Profile: No results for input(s): INR, PROTIME in the last 168 hours. Cardiac Enzymes: No results for input(s): CKTOTAL, CKMB, CKMBINDEX, TROPONINI in the last 168 hours. BNP (last 3 results) No results for input(s): PROBNP in the last 8760 hours. HbA1C: No results for input(s): HGBA1C in the last 72 hours. CBG: No results for input(s): GLUCAP in the last 168 hours. Lipid Profile: No  results for input(s): CHOL, HDL, LDLCALC, TRIG, CHOLHDL, LDLDIRECT in the last 72 hours. Thyroid Function Tests: No results for input(s): TSH, T4TOTAL, FREET4, T3FREE, THYROIDAB in the last 72 hours. Anemia Panel: No results for input(s): VITAMINB12, FOLATE, FERRITIN, TIBC, IRON, RETICCTPCT in the last 72 hours. Sepsis Labs:  Recent Labs Lab 01/15/17 1712 01/15/17 2038  LATICACIDVEN 1.1 1.0    No results found for this or any previous visit (from the past 240 hour(s)).       Radiology Studies: Dg Chest 2 View  Result Date: 01/17/2017 CLINICAL DATA:  Shortness of breath, abdominal pain EXAM: CHEST  2 VIEW COMPARISON:  10/06/2016 FINDINGS: Low lung volumes. Diffuse vascular and interstitial prominence, mild edema suspected. Borderline heart enlargement. Minor basilar atelectasis. No effusion or pneumothorax. Trachea is midline. Aorta is atherosclerotic and tortuous. Degenerative changes noted spine. IMPRESSION: Vascular and interstitial prominence, suspect mild interstitial edema pattern. Minor basilar atelectasis. Aortic atherosclerosis Electronically Signed   By: Jerilynn Mages.  Shick M.D.   On: 01/17/2017 11:31        Scheduled Meds: . enoxaparin (LOVENOX) injection  40 mg Subcutaneous Q24H  . furosemide  20 mg Intravenous Once  . pantoprazole  40 mg Oral Q1200   Continuous Infusions: . sodium chloride 50 mL/hr at 01/17/17 0936  . methocarbamol (ROBAXIN)  IV    . piperacillin-tazobactam (ZOSYN)  IV Stopped (01/17/17 1553)     LOS: 2 days    Time spent: 51mins    Filomena Pokorney, MD [facility] Pager [phone]  If 7PM-7AM, please contact night-coverage [email or web address] Password [facility] 01/17/2017, 6:09 PM

## 2017-01-17 NOTE — Progress Notes (Signed)
Subjective: Patient still having intermittent lower abdominal pain, but not worse since yesterday.  Objective: Vital signs in last 24 hours: Temp:  [98.3 F (36.8 C)-99.3 F (37.4 C)] 98.3 F (36.8 C) (07/28 0655) Pulse Rate:  [91-103] 91 (07/28 0655) Resp:  [18-20] 18 (07/28 0655) BP: (119-141)/(67-71) 119/67 (07/28 0655) SpO2:  [88 %-95 %] 93 % (07/28 0748) Last BM Date: 01/15/17  Intake/Output from previous day: 07/27 0701 - 07/28 0700 In: 240 [P.O.:240] Out: -  Intake/Output this shift: No intake/output data recorded.  General appearance: alert, cooperative and no distress GI: Soft, slightly distended. No bowel sounds appreciated. No rigidity noted. Tenderness to palpation in the left lower quadrant.  Lab Results:   Recent Labs  01/16/17 0547 01/17/17 0651  WBC 10.9* 9.9  HGB 13.5 12.9*  HCT 42.2 40.3  PLT 188 180   BMET  Recent Labs  01/16/17 0547 01/17/17 0651  NA 137 134*  K 4.1 4.1  CL 103 99*  CO2 25 26  GLUCOSE 112* 124*  BUN 29* 27*  CREATININE 1.21 1.08  CALCIUM 8.6* 8.7*   PT/INR No results for input(s): LABPROT, INR in the last 72 hours.  Studies/Results: Ct Abdomen Pelvis W Contrast  Result Date: 01/15/2017 CLINICAL DATA:  Left side abdominal pain for 2 days.  Worsening. EXAM: CT ABDOMEN AND PELVIS WITH CONTRAST TECHNIQUE: Multidetector CT imaging of the abdomen and pelvis was performed using the standard protocol following bolus administration of intravenous contrast. CONTRAST:  130mL ISOVUE-300 IOPAMIDOL (ISOVUE-300) INJECTION 61% COMPARISON:  01/03/2009 FINDINGS: Lower chest: No acute abnormality. Hepatobiliary: Low attenuation of the liver as can be seen with hepatic steatosis. No focal liver abnormality is seen. No gallstones, gallbladder wall thickening, or biliary dilatation. Pancreas: Unremarkable. No pancreatic ductal dilatation or surrounding inflammatory changes. Spleen: Normal in size without focal abnormality. Adrenals/Urinary  Tract: Adrenal glands are unremarkable. Small bilateral renal cysts. Kidneys are otherwise normal, without renal calculi, focal lesion, or hydronephrosis. Bladder is unremarkable. Stomach/Bowel: Diverticulosis of the descending colon and sigmoid colon. Bowel wall thickening involving the descending colon -sigmoid colon junction most consistent with diverticulitis. Small volume pneumoperitoneum most concerning for perforated diverticulitis. No pneumatosis or portal venous gas. Multiple air-fluid levels in the small bowel and proximal ascending colon as can be seen with an ileus. Vascular/Lymphatic: Mild abdominal aortic atherosclerosis. No lymphadenopathy. Reproductive: Prostate is unremarkable. Other: Small amount of complex pelvic fluid likely reflecting small amount a hemorrhage verusus developing abscess. Musculoskeletal: Degenerative disc disease with disc height loss L3-4 and L5-S1. No acute osseous abnormality. IMPRESSION: 1. Acute perforated diverticulitis of the descending colon-sigmoid colon junction. Small amount of complex pelvic fluid likely reflecting small amount a hemorrhage verusus developing abscess. These results were called by telephone at the time of interpretation on 01/15/2017 at 11:09 am to [address] , who verbally acknowledged these results. Electronically Signed   By: Kathreen Devoid   On: 01/15/2017 11:24    Anti-infectives: Anti-infectives    Start     Dose/Rate Route Frequency Ordered Stop   01/15/17 2000  piperacillin-tazobactam (ZOSYN) IVPB 3.375 g     3.375 g 12.5 mL/hr over 240 Minutes Intravenous Every 8 hours 01/15/17 1654     01/15/17 1145  piperacillin-tazobactam (ZOSYN) IVPB 3.375 g     3.375 g 100 mL/hr over 30 Minutes Intravenous  Once 01/15/17 1130 01/15/17 1231      Assessment/Plan: Impression: Sigmoid diverticulitis with perforation, slowly improving. Patient does have an ileus. We will continue clear liquid diet. Leukocytosis  has resolved.  LOS: 2 days     Aviva Signs 01/17/2017

## 2017-01-18 LAB — CBC
HCT: 40.7 % (ref 39.0–52.0)
Hemoglobin: 13.1 g/dL (ref 13.0–17.0)
MCH: 27.5 pg (ref 26.0–34.0)
MCHC: 32.2 g/dL (ref 30.0–36.0)
MCV: 85.5 fL (ref 78.0–100.0)
PLATELETS: 212 10*3/uL (ref 150–400)
RBC: 4.76 MIL/uL (ref 4.22–5.81)
RDW: 15.1 % (ref 11.5–15.5)
WBC: 8.3 10*3/uL (ref 4.0–10.5)

## 2017-01-18 LAB — BASIC METABOLIC PANEL
ANION GAP: 9 (ref 5–15)
BUN: 27 mg/dL — AB (ref 6–20)
CALCIUM: 8.8 mg/dL — AB (ref 8.9–10.3)
CO2: 29 mmol/L (ref 22–32)
Chloride: 98 mmol/L — ABNORMAL LOW (ref 101–111)
Creatinine, Ser: 1.11 mg/dL (ref 0.61–1.24)
GFR calc Af Amer: >60 mL/min (ref >60)
Glucose, Bld: 120 mg/dL — ABNORMAL HIGH (ref 65–99)
POTASSIUM: 4.1 mmol/L (ref 3.5–5.1)
SODIUM: 136 mmol/L (ref 135–145)

## 2017-01-18 MED ORDER — BISACODYL 10 MG RE SUPP
10.0000 mg | Freq: Two times a day (BID) | RECTAL | Status: DC
  Administered 2017-01-18 – 2017-01-19 (×2): 10 mg via RECTAL
  Filled 2017-01-18 (×3): qty 1

## 2017-01-18 NOTE — Progress Notes (Signed)
Pharmacy Antibiotic Note  Dakota Thompson is a 71 y.o. male admitted on 01/15/2017 with intraabdominal infection.  Pharmacy has been consulted for Zosyn dosing. Patient with resolvingSigmoid diverticulitis with perforation. Still has some LLQ pain.  Leukocytosis has resolved.  Plan: Continue Zosyn 3.375g IV q8h (4 hour infusion).  Labs per protocol  Height: 5\' 10"  (177.8 cm) Weight: 249 lb 1.9 oz (113 kg) IBW/kg (Calculated) : 73  Temp (24hrs), Avg:98.4 F (36.9 C), Min:98.1 F (36.7 C), Max:98.7 F (37.1 C)   Recent Labs Lab 01/15/17 0910 01/15/17 1712 01/15/17 2038 01/16/17 0547 01/17/17 0651 01/18/17 0705  WBC 11.2*  --   --  10.9* 9.9 8.3  CREATININE 1.12  --   --  1.21 1.08 1.11  LATICACIDVEN  --  1.1 1.0  --   --   --     Estimated Creatinine Clearance: 79.1 mL/min (by C-G formula based on SCr of 1.11 mg/dL).    No Known Allergies  Antimicrobials this admission: Zosyn  7/26 >>   Thank you for allowing pharmacy to be a part of this patient's care.  Isac Sarna, BS [place], [place] Clinical Pharmacist Pager [phone] 01/18/2017 2:38 PM

## 2017-01-18 NOTE — Progress Notes (Signed)
  Subjective: Patient feels somewhat better. He didn't sleep well last night. Still has some mild left lower quadrant abdominal pain. Is passing flatus.  Objective: Vital signs in last 24 hours: Temp:  [97.6 F (36.4 C)-98.7 F (37.1 C)] 98.7 F (37.1 C) (07/29 0603) Pulse Rate:  [89-94] 92 (07/29 0603) Resp:  [16-18] 18 (07/29 0603) BP: (104-123)/(63-67) 123/67 (07/29 0603) SpO2:  [94 %-97 %] 94 % (07/29 0603) Last BM Date: 01/15/17  Intake/Output from previous day: No intake/output data recorded. Intake/Output this shift: No intake/output data recorded.  General appearance: alert, cooperative and no distress Resp: clear to auscultation bilaterally Cardio: regular rate and rhythm, S1, S2 normal, no murmur, click, rub or gallop GI: Soft, mild tenderness in left lower quadrant palpation. Unchanged from yesterday. No rigidity noted.  Lab Results:   Recent Labs  01/17/17 0651 01/18/17 0705  WBC 9.9 8.3  HGB 12.9* 13.1  HCT 40.3 40.7  PLT 180 212   BMET  Recent Labs  01/17/17 0651 01/18/17 0705  NA 134* 136  K 4.1 4.1  CL 99* 98*  CO2 26 29  GLUCOSE 124* 120*  BUN 27* 27*  CREATININE 1.08 1.11  CALCIUM 8.7* 8.8*   PT/INR No results for input(s): LABPROT, INR in the last 72 hours.  Studies/Results: Dg Chest 2 View  Result Date: 01/17/2017 CLINICAL DATA:  Shortness of breath, abdominal pain EXAM: CHEST  2 VIEW COMPARISON:  10/06/2016 FINDINGS: Low lung volumes. Diffuse vascular and interstitial prominence, mild edema suspected. Borderline heart enlargement. Minor basilar atelectasis. No effusion or pneumothorax. Trachea is midline. Aorta is atherosclerotic and tortuous. Degenerative changes noted spine. IMPRESSION: Vascular and interstitial prominence, suspect mild interstitial edema pattern. Minor basilar atelectasis. Aortic atherosclerosis Electronically Signed   By: Jerilynn Mages.  Shick M.D.   On: 01/17/2017 11:31    Anti-infectives: Anti-infectives    Start      Dose/Rate Route Frequency Ordered Stop   01/15/17 2000  piperacillin-tazobactam (ZOSYN) IVPB 3.375 g     3.375 g 12.5 mL/hr over 240 Minutes Intravenous Every 8 hours 01/15/17 1654     01/15/17 1145  piperacillin-tazobactam (ZOSYN) IVPB 3.375 g     3.375 g 100 mL/hr over 30 Minutes Intravenous  Once 01/15/17 1130 01/15/17 1231      Assessment/Plan: Impression: Sigmoid diverticulitis with perforation, resolving. Bowel function starting return. Plan: Will advance to full liquid diet. Will give Dulcolax suppositories.  LOS: 3 days    Aviva Signs 01/18/2017

## 2017-01-18 NOTE — Progress Notes (Signed)
PROGRESS NOTE    Dakota Thompson  MLY:[id number] DOB: [birth year] DOA: 01/15/2017 PCP: Redmond School, MD   Brief Narrative:  71 year old male admitted with abdominal pain. Found to have perforated sigmoid diverticulitis. Started on intravenous antibiotics. General surgery following.   Assessment & Plan:   Active Problems:   GERD (gastroesophageal reflux disease)   Diverticulitis   Perforated diverticulum   HTN (hypertension)   HLD (hyperlipidemia)   1. Perforated sigmoid diverticulitis. Patient continues to have slow progress. He feels mildly better today, but is still in pain requiring IV pain medicine. Overall fevers appear to have improved. Leukocytosis has resolved. Continue the patient on Zosyn. He has not had a bowel movement and will be started on dulcolax suppositories. Advance diet to full liquids. May need to have surgical management if he does not improve. Continue pain management. 2. GERD. Continue on PPI 3. Hypertension. Hold Benicar until he is able to take medications by mouth. Will use hydralazine when necessary until then. Blood pressure has been stable 4. Hyperlipidemia. Hold statin and gemfibrozil until able to take by mouth.   DVT prophylaxis: Lovenox Code Status: Full code Family Communication: Discussed with daughter at the bedside Disposition Plan: Discharge home once improved   Consultants:   Gen. surgery  Procedures:     Antimicrobials:   Zosyn 7/26>>    Subjective: Passing flatus. Still has abdominal pain. Po intake poor. No vomiting.  Objective: Vitals:   01/17/17 2258 01/17/17 2259 01/18/17 0603 01/18/17 1400  BP:   123/67 122/71  Pulse: 89 89 92 97  Resp: 18 16 18 18   Temp:   98.7 F (37.1 C) 98.5 F (36.9 C)  TempSrc:   Oral Oral  SpO2: 95% 95% 94% 91%  Weight:      Height:       No intake or output data in the 24 hours ending 01/18/17 1444 Filed Weights   01/15/17 0904 01/15/17 1423  Weight: 113.4 kg (250 lb) 113 kg (249  lb 1.9 oz)    Examination:  General exam: Appears calm and comfortable  Respiratory system: Clear to auscultation. Respiratory effort normal. Cardiovascular system: S1 & S2 heard, RRR. No JVD, murmurs, rubs, gallops or clicks. No pedal edema. Gastrointestinal system: Abdomen is nondistended, soft and tender in LLQ. No organomegaly or masses felt. Normal bowel sounds heard. Central nervous system: Alert and oriented. No focal neurological deficits. Extremities: Symmetric 5 x 5 power. Skin: No rashes, lesions or ulcers Psychiatry: Judgement and insight appear normal. Mood & affect appropriate.     Data Reviewed: I have personally reviewed following labs and imaging studies  CBC:  Recent Labs Lab 01/15/17 0910 01/16/17 0547 01/17/17 0651 01/18/17 0705  WBC 11.2* 10.9* 9.9 8.3  HGB 14.3 13.5 12.9* 13.1  HCT 42.8 42.2 40.3 40.7  MCV 84.9 87.2 86.5 85.5  PLT 189 188 180 812   Basic Metabolic Panel:  Recent Labs Lab 01/15/17 0910 01/16/17 0547 01/17/17 0651 01/18/17 0705  NA 136 137 134* 136  K 4.2 4.1 4.1 4.1  CL 104 103 99* 98*  CO2 23 25 26 29   GLUCOSE 159* 112* 124* 120*  BUN 32* 29* 27* 27*  CREATININE 1.12 1.21 1.08 1.11  CALCIUM 9.0 8.6* 8.7* 8.8*   GFR: Estimated Creatinine Clearance: 79.1 mL/min (by C-G formula based on SCr of 1.11 mg/dL). Liver Function Tests:  Recent Labs Lab 01/15/17 0910 01/16/17 0547  AST 22 17  ALT 19 15*  ALKPHOS 41 38  BILITOT 1.4*  1.8*  PROT 7.7 7.3  ALBUMIN 4.0 3.7    Recent Labs Lab 01/15/17 0910  LIPASE 25   No results for input(s): AMMONIA in the last 168 hours. Coagulation Profile: No results for input(s): INR, PROTIME in the last 168 hours. Cardiac Enzymes: No results for input(s): CKTOTAL, CKMB, CKMBINDEX, TROPONINI in the last 168 hours. BNP (last 3 results) No results for input(s): PROBNP in the last 8760 hours. HbA1C: No results for input(s): HGBA1C in the last 72 hours. CBG: No results for input(s):  GLUCAP in the last 168 hours. Lipid Profile: No results for input(s): CHOL, HDL, LDLCALC, TRIG, CHOLHDL, LDLDIRECT in the last 72 hours. Thyroid Function Tests: No results for input(s): TSH, T4TOTAL, FREET4, T3FREE, THYROIDAB in the last 72 hours. Anemia Panel: No results for input(s): VITAMINB12, FOLATE, FERRITIN, TIBC, IRON, RETICCTPCT in the last 72 hours. Sepsis Labs:  Recent Labs Lab 01/15/17 1712 01/15/17 2038  LATICACIDVEN 1.1 1.0    No results found for this or any previous visit (from the past 240 hour(s)).       Radiology Studies: Dg Chest 2 View  Result Date: 01/17/2017 CLINICAL DATA:  Shortness of breath, abdominal pain EXAM: CHEST  2 VIEW COMPARISON:  10/06/2016 FINDINGS: Low lung volumes. Diffuse vascular and interstitial prominence, mild edema suspected. Borderline heart enlargement. Minor basilar atelectasis. No effusion or pneumothorax. Trachea is midline. Aorta is atherosclerotic and tortuous. Degenerative changes noted spine. IMPRESSION: Vascular and interstitial prominence, suspect mild interstitial edema pattern. Minor basilar atelectasis. Aortic atherosclerosis Electronically Signed   By: Jerilynn Mages.  Shick M.D.   On: 01/17/2017 11:31        Scheduled Meds: . bisacodyl  10 mg Rectal q12n4p  . enoxaparin (LOVENOX) injection  40 mg Subcutaneous Q24H  . pantoprazole  40 mg Oral Q1200   Continuous Infusions: . methocarbamol (ROBAXIN)  IV    . piperacillin-tazobactam (ZOSYN)  IV Stopped (01/18/17 1507)     LOS: 3 days    Time spent: 49mins    MEMON,JEHANZEB, MD [facility] Pager [phone]  If 7PM-7AM, please contact night-coverage [email or web address] Password [facility] 01/18/2017, 2:44 PM

## 2017-01-19 MED ORDER — OXYCODONE-ACETAMINOPHEN 7.5-325 MG PO TABS
1.0000 | ORAL_TABLET | ORAL | Status: DC | PRN
  Administered 2017-01-19 – 2017-01-20 (×3): 1 via ORAL
  Filled 2017-01-19 (×3): qty 1

## 2017-01-19 MED ORDER — SIMETHICONE 80 MG PO CHEW
160.0000 mg | CHEWABLE_TABLET | Freq: Four times a day (QID) | ORAL | Status: DC | PRN
  Administered 2017-01-19 – 2017-01-20 (×3): 160 mg via ORAL
  Filled 2017-01-19 (×3): qty 2

## 2017-01-19 NOTE — Progress Notes (Signed)
PROGRESS NOTE    JACQUISE RARICK  ZES:[id number] DOB: [birth year] DOA: 01/15/2017 PCP: Redmond School, MD   Brief Narrative:  71 year old male admitted with abdominal pain. Found to have perforated sigmoid diverticulitis. Started on intravenous antibiotics. General surgery following.   Assessment & Plan:   Active Problems:   GERD (gastroesophageal reflux disease)   Diverticulitis   Perforated diverticulum   HTN (hypertension)   HLD (hyperlipidemia)   1. Perforated sigmoid diverticulitis. He feels mildly better today. Overall fevers appear to have improved. Leukocytosis has resolved. Continue the patient on Zosyn. Having bowel movements with suppositories. Tolerating full liquids. Advance diet to soft diet. Continue pain management. 2. GERD. Continue on PPI 3. Hypertension. Hold Benicar until he is able to take medications by mouth. Will use hydralazine when necessary until then. Blood pressure has been stable 4. Hyperlipidemia. Hold statin and gemfibrozil until able to take by mouth.   DVT prophylaxis: Lovenox Code Status: Full code Family Communication: Discussed with family at the bedside Disposition Plan: Discharge home once improved   Consultants:   Gen. surgery  Procedures:     Antimicrobials:   Zosyn 7/26>>    Subjective: Had a bowel movement yesterday. Still has pain but overall doing better  Objective: Vitals:   01/18/17 1400 01/18/17 2142 01/18/17 2144 01/19/17 0555  BP: 122/71  121/64 (!) 102/59  Pulse: 97  97 84  Resp: 18  18 18   Temp: 98.5 F (36.9 C)  98 F (36.7 C) 99 F (37.2 C)  TempSrc: Oral  Axillary Oral  SpO2: 91% (!) 88% 97% 97%  Weight:      Height:        Intake/Output Summary (Last 24 hours) at 01/19/17 1440 Last data filed at 01/19/17 2633  Gross per 24 hour  Intake              295 ml  Output                0 ml  Net              295 ml   Filed Weights   01/15/17 0904 01/15/17 1423  Weight: 113.4 kg (250 lb) 113 kg (249  lb 1.9 oz)    Examination:  General exam: Appears calm and comfortable  Respiratory system: Clear to auscultation. Respiratory effort normal. Cardiovascular system: S1 & S2 heard, RRR. No JVD, murmurs, rubs, gallops or clicks. No pedal edema. Gastrointestinal system: Abdomen is nondistended, soft and tender in LLQ. No organomegaly or masses felt. Normal bowel sounds heard. Central nervous system: Alert and oriented. No focal neurological deficits. Extremities: Symmetric 5 x 5 power. Skin: No rashes, lesions or ulcers Psychiatry: Judgement and insight appear normal. Mood & affect appropriate.     Data Reviewed: I have personally reviewed following labs and imaging studies  CBC:  Recent Labs Lab 01/15/17 0910 01/16/17 0547 01/17/17 0651 01/18/17 0705  WBC 11.2* 10.9* 9.9 8.3  HGB 14.3 13.5 12.9* 13.1  HCT 42.8 42.2 40.3 40.7  MCV 84.9 87.2 86.5 85.5  PLT 189 188 180 354   Basic Metabolic Panel:  Recent Labs Lab 01/15/17 0910 01/16/17 0547 01/17/17 0651 01/18/17 0705  NA 136 137 134* 136  K 4.2 4.1 4.1 4.1  CL 104 103 99* 98*  CO2 23 25 26 29   GLUCOSE 159* 112* 124* 120*  BUN 32* 29* 27* 27*  CREATININE 1.12 1.21 1.08 1.11  CALCIUM 9.0 8.6* 8.7* 8.8*   GFR: Estimated Creatinine Clearance:  79.1 mL/min (by C-G formula based on SCr of 1.11 mg/dL). Liver Function Tests:  Recent Labs Lab 01/15/17 0910 01/16/17 0547  AST 22 17  ALT 19 15*  ALKPHOS 41 38  BILITOT 1.4* 1.8*  PROT 7.7 7.3  ALBUMIN 4.0 3.7    Recent Labs Lab 01/15/17 0910  LIPASE 25   No results for input(s): AMMONIA in the last 168 hours. Coagulation Profile: No results for input(s): INR, PROTIME in the last 168 hours. Cardiac Enzymes: No results for input(s): CKTOTAL, CKMB, CKMBINDEX, TROPONINI in the last 168 hours. BNP (last 3 results) No results for input(s): PROBNP in the last 8760 hours. HbA1C: No results for input(s): HGBA1C in the last 72 hours. CBG: No results for input(s):  GLUCAP in the last 168 hours. Lipid Profile: No results for input(s): CHOL, HDL, LDLCALC, TRIG, CHOLHDL, LDLDIRECT in the last 72 hours. Thyroid Function Tests: No results for input(s): TSH, T4TOTAL, FREET4, T3FREE, THYROIDAB in the last 72 hours. Anemia Panel: No results for input(s): VITAMINB12, FOLATE, FERRITIN, TIBC, IRON, RETICCTPCT in the last 72 hours. Sepsis Labs:  Recent Labs Lab 01/15/17 1712 01/15/17 2038  LATICACIDVEN 1.1 1.0    No results found for this or any previous visit (from the past 240 hour(s)).       Radiology Studies: No results found.      Scheduled Meds: . bisacodyl  10 mg Rectal q12n4p  . enoxaparin (LOVENOX) injection  40 mg Subcutaneous Q24H  . pantoprazole  40 mg Oral Q1200   Continuous Infusions: . methocarbamol (ROBAXIN)  IV    . piperacillin-tazobactam (ZOSYN)  IV 3.375 g (01/19/17 1208)     LOS: 4 days    Time spent: 55mins    Trenita Hulme, MD [facility] Pager [phone]  If 7PM-7AM, please contact night-coverage [email or web address] Password [email or web address] 01/19/2017, 2:40 PM

## 2017-01-19 NOTE — Progress Notes (Signed)
Pt has his home unit and stated doesn't need my assistance with putting machine on. Machine checked to make sure it has water and mask in patients reach

## 2017-01-19 NOTE — Progress Notes (Signed)
  Subjective: Patient feeling better. Has mild incisional pain when he is walking around. He did have a bowel movement yesterday. He continues to pass flatus.  Objective: Vital signs in last 24 hours: Temp:  [98 F (36.7 C)-99 F (37.2 C)] 99 F (37.2 C) (07/30 0555) Pulse Rate:  [84-97] 84 (07/30 0555) Resp:  [18] 18 (07/30 0555) BP: (102-122)/(59-71) 102/59 (07/30 0555) SpO2:  [88 %-97 %] 97 % (07/30 0555) Last BM Date: 01/18/17  Intake/Output from previous day: 07/29 0701 - 07/30 0700 In: 295 [P.O.:120; IV Piggyback:175] Out: -  Intake/Output this shift: No intake/output data recorded.  General appearance: alert, cooperative and no distress GI: Soft with minimal tenderness in the left lower quadrant palpation. No rigidity is noted.  Lab Results:   Recent Labs  01/17/17 0651 01/18/17 0705  WBC 9.9 8.3  HGB 12.9* 13.1  HCT 40.3 40.7  PLT 180 212   BMET  Recent Labs  01/17/17 0651 01/18/17 0705  NA 134* 136  K 4.1 4.1  CL 99* 98*  CO2 26 29  GLUCOSE 124* 120*  BUN 27* 27*  CREATININE 1.08 1.11  CALCIUM 8.7* 8.8*   PT/INR No results for input(s): LABPROT, INR in the last 72 hours.  Studies/Results: Dg Chest 2 View  Result Date: 01/17/2017 CLINICAL DATA:  Shortness of breath, abdominal pain EXAM: CHEST  2 VIEW COMPARISON:  10/06/2016 FINDINGS: Low lung volumes. Diffuse vascular and interstitial prominence, mild edema suspected. Borderline heart enlargement. Minor basilar atelectasis. No effusion or pneumothorax. Trachea is midline. Aorta is atherosclerotic and tortuous. Degenerative changes noted spine. IMPRESSION: Vascular and interstitial prominence, suspect mild interstitial edema pattern. Minor basilar atelectasis. Aortic atherosclerosis Electronically Signed   By: Jerilynn Mages.  Shick M.D.   On: 01/17/2017 11:31    Anti-infectives: Anti-infectives    Start     Dose/Rate Route Frequency Ordered Stop   01/15/17 2000  piperacillin-tazobactam (ZOSYN) IVPB 3.375 g      3.375 g 12.5 mL/hr over 240 Minutes Intravenous Every 8 hours 01/15/17 1654     01/15/17 1145  piperacillin-tazobactam (ZOSYN) IVPB 3.375 g     3.375 g 100 mL/hr over 30 Minutes Intravenous  Once 01/15/17 1130 01/15/17 1231      Assessment/Plan: Impression: Sigmoid diverticulitis with perforation, resolving Plan: Will advance to soft diet. Anticipate discharge in next 24-48 hours.  LOS: 4 days    Aviva Signs 01/19/2017

## 2017-01-19 NOTE — Care Management Note (Signed)
Case Management Note  Patient Details  Name: Dakota Thompson MRN: [id number] Date of Birth: [birth year]  Subjective/Objective: Chart reviewed. No CM consult.  Adm with sigmoid diverticulitis with perforation. From home, ind PTA.              Action/Plan:Anticipate DC home with self care. Ambulating in room. CM following.  Expected Discharge Date:    01/20/2017              Expected Discharge Plan:     In-House Referral:     Discharge planning Services  CM Consult  Post Acute Care Choice:    Choice offered to:     DME Arranged:    DME Agency:     HH Arranged:    HH Agency:     Status of Service:  In process, will continue to follow  If discussed at Long Length of Stay Meetings, dates discussed:    Additional Comments:  Dakota Thompson, Chauncey Reading, RN 01/19/2017, 1:57 PM

## 2017-01-20 MED ORDER — HYDROCODONE-ACETAMINOPHEN 5-325 MG PO TABS: 1.0000 | ORAL_TABLET | ORAL | 0 refills | Status: DC | PRN

## 2017-01-20 MED ORDER — AMOXICILLIN-POT CLAVULANATE 875-125 MG PO TABS: 1.0000 | ORAL_TABLET | Freq: Two times a day (BID) | ORAL | 0 refills | Status: AC

## 2017-01-20 MED ORDER — TRAZODONE HCL 50 MG PO TABS: 50.0000 mg | ORAL_TABLET | Freq: Every evening | ORAL | 0 refills | Status: DC | PRN

## 2017-01-20 MED ORDER — ASPIRIN EC 325 MG PO TBEC: 325.0000 mg | DELAYED_RELEASE_TABLET | Freq: Every day | ORAL | 0 refills | Status: DC

## 2017-01-20 NOTE — Care Management Note (Signed)
Case Management Note  Patient Details  Name: Dakota Thompson MRN: [id number] Date of Birth: 1945/08/09   Status of Service:  In process, will continue to follow  If discussed at Long Length of Stay Meetings, dates discussed:   01/20/2017 Additional Comments:  Raziya Aveni, Chauncey Reading, RN 01/20/2017, 10:26 AM

## 2017-01-20 NOTE — Progress Notes (Signed)
  Subjective: Patient states he is having minimal abdominal pain. Is having bowel movements. Tolerating soft diet well.  Objective: Vital signs in last 24 hours: Temp:  [98.9 F (37.2 C)-100 F (37.8 C)] 99.3 F (37.4 C) (07/31 0543) Pulse Rate:  [85-101] 85 (07/31 0543) Resp:  [20] 20 (07/31 0543) BP: (98-122)/(53-70) 122/60 (07/31 0543) SpO2:  [93 %-95 %] 94 % (07/31 0543) Last BM Date: 01/19/17  Intake/Output from previous day: 07/30 0701 - 07/31 0700 In: 620 [P.O.:480; IV Piggyback:100] Out: -  Intake/Output this shift: Total I/O In: 25 [P.O.:25] Out: -   General appearance: alert, cooperative and no distress GI: Soft, abdomen soft with decreased tenderness in left lower quadrant. No rigidity noted.  Lab Results:   Recent Labs  01/18/17 0705  WBC 8.3  HGB 13.1  HCT 40.7  PLT 212   BMET  Recent Labs  01/18/17 0705  NA 136  K 4.1  CL 98*  CO2 29  GLUCOSE 120*  BUN 27*  CREATININE 1.11  CALCIUM 8.8*   PT/INR No results for input(s): LABPROT, INR in the last 72 hours.  Studies/Results: No results found.  Anti-infectives: Anti-infectives    Start     Dose/Rate Route Frequency Ordered Stop   01/15/17 2000  piperacillin-tazobactam (ZOSYN) IVPB 3.375 g     3.375 g 12.5 mL/hr over 240 Minutes Intravenous Every 8 hours 01/15/17 1654     01/15/17 1145  piperacillin-tazobactam (ZOSYN) IVPB 3.375 g     3.375 g 100 mL/hr over 30 Minutes Intravenous  Once 01/15/17 1130 01/15/17 1231      Assessment/Plan: Impression: Sigmoid diverticulitis with perforation, resolving Plan: Okay for discharge from surgery standpoint. Would give antibiotics for 2 weeks. I will see her in my office in 2 weeks for follow-up.  LOS: 5 days    Aviva Signs 01/20/2017

## 2017-01-20 NOTE — Care Management Important Message (Signed)
Important Message  Patient Details  Name: Dakota Thompson MRN: [id number] Date of Birth: [birth year]   Medicare Important Message Given:  Yes    Anjoli Diemer, Chauncey Reading, RN 01/20/2017, 10:36 AM

## 2017-01-20 NOTE — Discharge Summary (Signed)
Physician Discharge Summary  Dakota Thompson RJJ:[id number] DOB: 25-Feb-1946 DOA: 01/15/2017  PCP: Redmond School, MD  Admit date: 01/15/2017 Discharge date: 01/20/2017  Admitted From: home Disposition:  home  Recommendations for Outpatient Follow-up:  1. Follow up with PCP in 1-2 weeks 2. Please obtain BMP/CBC in one week 3. Follow up with Dr. Arnoldo Morale in 2 weeks  Home Health:  Equipment/Devices:  Discharge Condition: stable CODE STATUS: full code Diet recommendation: Heart Healthy  Brief/Interim Summary: 71 year old male admitted with abdominal pain. Found to have perforated sigmoid diverticulitis. Started on intravenous antibiotics. General surgery following.  Discharge Diagnoses:  Active Problems:   GERD (gastroesophageal reflux disease)   Diverticulitis   Perforated diverticulum   HTN (hypertension)   HLD (hyperlipidemia)  1. Perforated sigmoid diverticulitis. Patient was treated with intravenous antibiotics. His fevers resolved. Oral abdominal pain has improved. Diet was advanced and he is now tolerating a solid diet. Leukocytosis has resolved. He has been transitioned to a course of Augmentin to complete 14 days. He will follow up with general surgery in 2 weeks.. 2. GERD. Continue on PPI 3. Hypertension. Restart Benicar on discharge 4. Hyperlipidemia. Restart statin and gemfibrozil on discharge  Discharge Instructions  Discharge Instructions    Diet - low sodium heart healthy    Complete by:  As directed    Increase activity slowly    Complete by:  As directed      Allergies as of 01/20/2017   No Known Allergies     Medication List    TAKE these medications   aspirin EC 325 MG tablet Take 1 tablet (325 mg total) by mouth daily. What changed:  when to take this   celecoxib 100 MG capsule Commonly known as:  CELEBREX Take 1 capsule by mouth 2 (two) times daily as needed for pain.   Coenzyme Q10 200 MG capsule Take 200 mg by mouth every evening.    docusate sodium 100 MG capsule Commonly known as:  COLACE Take 200 mg by mouth daily.   Fish Oil 1200 MG Caps Take 1,200-2,400 mg by mouth 2 (two) times daily. 2400 mg in the morning & 1200 mg in the evening.   gabapentin 300 MG capsule Commonly known as:  NEURONTIN Take 600 mg by mouth at bedtime.   gemfibrozil 600 MG tablet Commonly known as:  LOPID Take 600 mg by mouth 2 (two) times daily.   HYDROcodone-acetaminophen 5-325 MG tablet Commonly known as:  NORCO/VICODIN Take 1-2 tablets by mouth every 4 (four) hours as needed. What changed:  reasons to take this   methocarbamol 500 MG tablet Commonly known as:  ROBAXIN Take 1 tablet (500 mg total) by mouth every 8 (eight) hours as needed for muscle spasms.   multivitamin with minerals Tabs tablet Take 1 tablet by mouth daily. Centrum Silver   olmesartan 40 MG tablet Commonly known as:  BENICAR Take 40 mg by mouth daily.   omeprazole 20 MG capsule Commonly known as:  PRILOSEC Take 20 mg by mouth 2 (two) times daily.   pravastatin 20 MG tablet Commonly known as:  PRAVACHOL Take 20 mg by mouth every evening.   traZODone 50 MG tablet Commonly known as:  DESYREL Take 1 tablet (50 mg total) by mouth at bedtime as needed for sleep.      Follow-up Information    Aviva Signs, MD. Schedule an appointment as soon as possible for a visit on 02/05/2017.   Specialty:  General Surgery Contact information: [address] [place]  [id number] [phone]          No Known Allergies  Consultations:  General surgery   Procedures/Studies: Dg Chest 2 View  Result Date: 01/17/2017 CLINICAL DATA:  Shortness of breath, abdominal pain EXAM: CHEST  2 VIEW COMPARISON:  10/06/2016 FINDINGS: Low lung volumes. Diffuse vascular and interstitial prominence, mild edema suspected. Borderline heart enlargement. Minor basilar atelectasis. No effusion or pneumothorax. Trachea is midline. Aorta is atherosclerotic and  tortuous. Degenerative changes noted spine. IMPRESSION: Vascular and interstitial prominence, suspect mild interstitial edema pattern. Minor basilar atelectasis. Aortic atherosclerosis Electronically Signed   By: Jerilynn Mages.  Shick M.D.   On: 01/17/2017 11:31   Ct Abdomen Pelvis W Contrast  Result Date: 01/15/2017 CLINICAL DATA:  Left side abdominal pain for 2 days.  Worsening. EXAM: CT ABDOMEN AND PELVIS WITH CONTRAST TECHNIQUE: Multidetector CT imaging of the abdomen and pelvis was performed using the standard protocol following bolus administration of intravenous contrast. CONTRAST:  123mL ISOVUE-300 IOPAMIDOL (ISOVUE-300) INJECTION 61% COMPARISON:  01/03/2009 FINDINGS: Lower chest: No acute abnormality. Hepatobiliary: Low attenuation of the liver as can be seen with hepatic steatosis. No focal liver abnormality is seen. No gallstones, gallbladder wall thickening, or biliary dilatation. Pancreas: Unremarkable. No pancreatic ductal dilatation or surrounding inflammatory changes. Spleen: Normal in size without focal abnormality. Adrenals/Urinary Tract: Adrenal glands are unremarkable. Small bilateral renal cysts. Kidneys are otherwise normal, without renal calculi, focal lesion, or hydronephrosis. Bladder is unremarkable. Stomach/Bowel: Diverticulosis of the descending colon and sigmoid colon. Bowel wall thickening involving the descending colon -sigmoid colon junction most consistent with diverticulitis. Small volume pneumoperitoneum most concerning for perforated diverticulitis. No pneumatosis or portal venous gas. Multiple air-fluid levels in the small bowel and proximal ascending colon as can be seen with an ileus. Vascular/Lymphatic: Mild abdominal aortic atherosclerosis. No lymphadenopathy. Reproductive: Prostate is unremarkable. Other: Small amount of complex pelvic fluid likely reflecting small amount a hemorrhage verusus developing abscess. Musculoskeletal: Degenerative disc disease with disc height loss L3-4  and L5-S1. No acute osseous abnormality. IMPRESSION: 1. Acute perforated diverticulitis of the descending colon-sigmoid colon junction. Small amount of complex pelvic fluid likely reflecting small amount a hemorrhage verusus developing abscess. These results were called by telephone at the time of interpretation on 01/15/2017 at 11:09 am to [place] , who verbally acknowledged these results. Electronically Signed   By: Kathreen Devoid   On: 01/15/2017 11:24       Subjective: Abdominal pain improving. Tolerating diet  Discharge Exam: Vitals:   01/19/17 2150 01/20/17 0543  BP: 114/70 122/60  Pulse: 95 85  Resp: 20 20  Temp: 98.9 F (37.2 C) 99.3 F (37.4 C)   Vitals:   01/19/17 1503 01/19/17 1944 01/19/17 2150 01/20/17 0543  BP: (!) 98/53  114/70 122/60  Pulse: (!) 101  95 85  Resp: 20  20 20   Temp: 100 F (37.8 C)  98.9 F (37.2 C) 99.3 F (37.4 C)  TempSrc: Oral  Oral Oral  SpO2: 95% 93% 95% 94%  Weight:      Height:        General: Pt is alert, awake, not in acute distress Cardiovascular: RRR, S1/S2 +, no rubs, no gallops Respiratory: CTA bilaterally, no wheezing, no rhonchi Abdominal: Soft, diffusely tender, ND, bowel sounds + Extremities: no edema, no cyanosis    The results of significant diagnostics from this hospitalization (including imaging, microbiology, ancillary and laboratory) are listed below for reference.     Microbiology: No results found for this or any previous visit (  from the past 240 hour(s)).   Labs: BNP (last 3 results) No results for input(s): BNP in the last 8760 hours. Basic Metabolic Panel:  Recent Labs Lab 01/15/17 0910 01/16/17 0547 01/17/17 0651 01/18/17 0705  NA 136 137 134* 136  K 4.2 4.1 4.1 4.1  CL 104 103 99* 98*  CO2 23 25 26 29   GLUCOSE 159* 112* 124* 120*  BUN 32* 29* 27* 27*  CREATININE 1.12 1.21 1.08 1.11  CALCIUM 9.0 8.6* 8.7* 8.8*   Liver Function Tests:  Recent Labs Lab 01/15/17 0910 01/16/17 0547   AST 22 17  ALT 19 15*  ALKPHOS 41 38  BILITOT 1.4* 1.8*  PROT 7.7 7.3  ALBUMIN 4.0 3.7    Recent Labs Lab 01/15/17 0910  LIPASE 25   No results for input(s): AMMONIA in the last 168 hours. CBC:  Recent Labs Lab 01/15/17 0910 01/16/17 0547 01/17/17 0651 01/18/17 0705  WBC 11.2* 10.9* 9.9 8.3  HGB 14.3 13.5 12.9* 13.1  HCT 42.8 42.2 40.3 40.7  MCV 84.9 87.2 86.5 85.5  PLT 189 188 180 212   Cardiac Enzymes: No results for input(s): CKTOTAL, CKMB, CKMBINDEX, TROPONINI in the last 168 hours. BNP: Invalid input(s): POCBNP CBG: No results for input(s): GLUCAP in the last 168 hours. D-Dimer No results for input(s): DDIMER in the last 72 hours. Hgb A1c No results for input(s): HGBA1C in the last 72 hours. Lipid Profile No results for input(s): CHOL, HDL, LDLCALC, TRIG, CHOLHDL, LDLDIRECT in the last 72 hours. Thyroid function studies No results for input(s): TSH, T4TOTAL, T3FREE, THYROIDAB in the last 72 hours.  Invalid input(s): FREET3 Anemia work up No results for input(s): VITAMINB12, FOLATE, FERRITIN, TIBC, IRON, RETICCTPCT in the last 72 hours. Urinalysis    Component Value Date/Time   COLORURINE YELLOW 01/15/2017 0909   APPEARANCEUR CLEAR 01/15/2017 0909   LABSPEC >1.046 (H) 01/15/2017 0909   PHURINE 6.0 01/15/2017 0909   GLUCOSEU NEGATIVE 01/15/2017 0909   HGBUR NEGATIVE 01/15/2017 0909   BILIRUBINUR NEGATIVE 01/15/2017 0909   KETONESUR NEGATIVE 01/15/2017 0909   PROTEINUR 30 (A) 01/15/2017 0909   UROBILINOGEN 1.0 11/13/2010 1458   NITRITE NEGATIVE 01/15/2017 0909   LEUKOCYTESUR NEGATIVE 01/15/2017 0909   Sepsis Labs Invalid input(s): PROCALCITONIN,  WBC,  LACTICIDVEN Microbiology No results found for this or any previous visit (from the past 240 hour(s)).   Time coordinating discharge: Over 30 minutes  SIGNED:   Kathie Dike, MD  [facility] 01/20/2017, 12:25 PM Pager   If 7PM-7AM, please contact  night-coverage [email or web address] Password [email or web address]

## 2017-02-03 ENCOUNTER — Ambulatory Visit (INDEPENDENT_AMBULATORY_CARE_PROVIDER_SITE_OTHER): Payer: Medicare Other | Admitting: General Surgery

## 2017-02-03 ENCOUNTER — Encounter: Payer: Self-pay | Admitting: General Surgery

## 2017-02-03 VITALS — BP 119/62 | HR 79 | Temp 98.4°F | Resp 18 | Ht 70.0 in | Wt 239.0 lb

## 2017-02-03 DIAGNOSIS — K5720 Diverticulitis of large intestine with perforation and abscess without bleeding: Secondary | ICD-10-CM

## 2017-02-03 NOTE — Progress Notes (Signed)
Subjective:     Dakota Thompson    Patient is here for follow-up after admission for perforated sigmoid diverticulitis.  He did not require surgery.  He is still taking his antibiotic.  He has noted a decrease in pain, although it is still present in the left lower quadrant.  He denies any fever or chills.  His bowel movements have been loose but not too frequent.  His appetite is improving. Objective:    BP 119/62   Pulse 79   Temp 98.4 F (36.9 C)   Resp 18   Ht 5\' 10"  (1.778 m)   Wt 239 lb (108.4 kg)   BMI 34.29 kg/m   General:  alert, cooperative and no distress    Abdomen is soft with minimal tenderness to deep palpation in the left lower quadrant.  No rigidity is noted.     Assessment:    Sigmoid diverticulitis with contained perforation, resolving    Plan:    No need for acute surgical intervention at this time.  I did tell the patient that it may take several weeks for his pain fully resolved.  I would like to see him again in one month for follow-up.  I did explain that he may not need surgery in the future should he not have recurrent symptoms as this is his first episode.  He is to finish his antibiotic course.

## 2017-03-06 ENCOUNTER — Emergency Department (HOSPITAL_COMMUNITY)
Admission: EM | Admit: 2017-03-06 | Discharge: 2017-03-06 | Disposition: A | Discharge status: Home / Self Care | Payer: Medicare Other | Attending: Emergency Medicine | Admitting: Emergency Medicine

## 2017-03-06 ENCOUNTER — Encounter (HOSPITAL_COMMUNITY): Payer: Self-pay | Admitting: Emergency Medicine

## 2017-03-06 DIAGNOSIS — Z79899 Other long term (current) drug therapy: Secondary | ICD-10-CM | POA: Insufficient documentation

## 2017-03-06 DIAGNOSIS — Z87891 Personal history of nicotine dependence: Secondary | ICD-10-CM | POA: Insufficient documentation

## 2017-03-06 DIAGNOSIS — K5720 Diverticulitis of large intestine with perforation and abscess without bleeding: Secondary | ICD-10-CM | POA: Diagnosis not present

## 2017-03-06 DIAGNOSIS — Z7982 Long term (current) use of aspirin: Secondary | ICD-10-CM | POA: Insufficient documentation

## 2017-03-06 DIAGNOSIS — I10 Essential (primary) hypertension: Secondary | ICD-10-CM | POA: Insufficient documentation

## 2017-03-06 DIAGNOSIS — R109 Unspecified abdominal pain: Secondary | ICD-10-CM | POA: Diagnosis present

## 2017-03-06 LAB — URINALYSIS, ROUTINE W REFLEX MICROSCOPIC
Bilirubin Urine: NEGATIVE
GLUCOSE, UA: NEGATIVE mg/dL
Hgb urine dipstick: NEGATIVE
KETONES UR: NEGATIVE mg/dL
LEUKOCYTES UA: NEGATIVE
Nitrite: NEGATIVE
PH: 6 (ref 5.0–8.0)
PROTEIN: NEGATIVE mg/dL
Specific Gravity, Urine: 1.036 — ABNORMAL HIGH (ref 1.005–1.030)

## 2017-03-06 LAB — CBC
HCT: 40 % (ref 39.0–52.0)
HEMOGLOBIN: 13.1 g/dL (ref 13.0–17.0)
MCH: 27.5 pg (ref 26.0–34.0)
MCHC: 32.8 g/dL (ref 30.0–36.0)
MCV: 84 fL (ref 78.0–100.0)
PLATELETS: 243 10*3/uL (ref 150–400)
RBC: 4.76 MIL/uL (ref 4.22–5.81)
RDW: 16.1 % — AB (ref 11.5–15.5)
WBC: 8.4 10*3/uL (ref 4.0–10.5)

## 2017-03-06 LAB — COMPREHENSIVE METABOLIC PANEL
ALK PHOS: 42 U/L (ref 38–126)
ALT: 25 U/L (ref 17–63)
ANION GAP: 7 (ref 5–15)
AST: 43 U/L — ABNORMAL HIGH (ref 15–41)
Albumin: 4 g/dL (ref 3.5–5.0)
BILIRUBIN TOTAL: 0.5 mg/dL (ref 0.3–1.2)
BUN: 31 mg/dL — ABNORMAL HIGH (ref 6–20)
CALCIUM: 9 mg/dL (ref 8.9–10.3)
CO2: 25 mmol/L (ref 22–32)
Chloride: 101 mmol/L (ref 101–111)
Creatinine, Ser: 1.43 mg/dL — ABNORMAL HIGH (ref 0.61–1.24)
GFR, EST AFRICAN AMERICAN: 56 mL/min — AB (ref >60)
GFR, EST NON AFRICAN AMERICAN: 48 mL/min — AB (ref >60)
Glucose, Bld: 131 mg/dL — ABNORMAL HIGH (ref 65–99)
Potassium: 4.3 mmol/L (ref 3.5–5.1)
SODIUM: 133 mmol/L — AB (ref 135–145)
TOTAL PROTEIN: 7.7 g/dL (ref 6.5–8.1)

## 2017-03-06 LAB — LIPASE, BLOOD: Lipase: 27 U/L (ref 11–51)

## 2017-03-06 MED ORDER — CIPROFLOXACIN HCL 500 MG PO TABS: 500.0000 mg | ORAL_TABLET | Freq: Two times a day (BID) | ORAL | 0 refills | Status: DC

## 2017-03-06 MED ORDER — SODIUM CHLORIDE 0.9 % IV BOLUS (SEPSIS)
1000.0000 mL | Freq: Once | INTRAVENOUS | Status: AC
  Administered 2017-03-06: 1000 mL via INTRAVENOUS

## 2017-03-06 MED ORDER — METRONIDAZOLE IN NACL 5-0.79 MG/ML-% IV SOLN
500.0000 mg | Freq: Once | INTRAVENOUS | Status: AC
  Administered 2017-03-06: 500 mg via INTRAVENOUS
  Filled 2017-03-06: qty 100

## 2017-03-06 MED ORDER — METRONIDAZOLE 500 MG PO TABS: 500.0000 mg | ORAL_TABLET | Freq: Two times a day (BID) | ORAL | 0 refills | Status: DC

## 2017-03-06 MED ORDER — CIPROFLOXACIN IN D5W 400 MG/200ML IV SOLN
400.0000 mg | Freq: Once | INTRAVENOUS | Status: AC
Start: 2017-03-06 — End: 2017-03-06
  Administered 2017-03-06: 400 mg via INTRAVENOUS
  Filled 2017-03-06: qty 200

## 2017-03-06 MED ORDER — HYDROCODONE-ACETAMINOPHEN 5-325 MG PO TABS: 1.0000 | ORAL_TABLET | ORAL | 0 refills | Status: DC | PRN

## 2017-03-06 NOTE — ED Provider Notes (Signed)
[facility] DEPT Provider Note   CSN: [id number] Arrival date & time: 03/06/17  1220     History   Chief Complaint Chief Complaint  Patient presents with  . Abdominal Pain    HPI Dakota Thompson is a 71 y.o. male.  HPI Patient finished a course of Augmentin for perforated diverticulitis 3 days ago. Has developed gradually worsening left lower quadrant abdominal pain. No nausea or vomiting. Saw Dr. Gerarda Fraction today who arranged outpatient CT. Advised to go to the emergency department. Patient states he had 2 bouts of loose stool but denies gross blood. No fever or chills. Past Medical History:  Diagnosis Date  . Anxiety   . 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 GERD (gastroesophageal reflux disease)   . Hx of adenomatous colonic polyps    tubulovillous adenoma  . Hypertension   . Pancytopenia    mild  . Pancytopenia 02/27/2011  . Restless leg syndrome   . Sleep apnea    wears CPAP/BIPAP every night  . Thyroid disease   . Viral syndrome    Hx  . Viral syndrome 02/27/2011    Patient Active Problem List   Diagnosis Date Noted  . Diverticulitis 01/15/2017  . HTN (hypertension) 01/15/2017  . HLD (hyperlipidemia) 01/15/2017  . Perforated diverticulum   . 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 History of colonic polyps   . Diverticulosis of colon without hemorrhage   . Hiatal hernia   . Hx of adenomatous colonic polyps 01/11/2015  . GERD (gastroesophageal reflux disease) 01/11/2015  . Constipation 01/11/2015  . Restless leg syndrome 01/07/2013  . Unspecified hereditary and idiopathic peripheral neuropathy 01/07/2013  . Mild cognitive impairment 01/07/2013  . Pancytopenia 02/27/2011  . Viral syndrome 02/27/2011    Past Surgical History:  Procedure Laterality Date  . COLONOSCOPY  01/02/2010   RMR: normal rectum and colon. marginal prep compromised exam.   . COLONOSCOPY N/A 03/15/2015   Procedure: COLONOSCOPY;  Surgeon:  Daneil Dolin, MD;  Location: [facility];  Service: Endoscopy;  Laterality: N/A;  1215 - moved to 12:45 - office to notify  . ESOPHAGOGASTRODUODENOSCOPY N/A 03/15/2015   Procedure: ESOPHAGOGASTRODUODENOSCOPY (EGD);  Surgeon: Daneil Dolin, MD;  Location: [facility];  Service: Endoscopy;  Laterality: N/A;  . EYE SURGERY     bilateral cataracts  . GANGLION CYST EXCISION    . knee cartilage repair  01/31/11  . NASAL SINUS SURGERY    . TOTAL KNEE ARTHROPLASTY Right 10/03/2016   Procedure: RIGHT TOTAL KNEE ARTHROPLASTY;  Surgeon: Sydnee Cabal, MD;  Location: [facility];  Service: Orthopedics;  Laterality: Right;       Home Medications    Prior to Admission medications   Medication Sig Start Date End Date Taking? Authorizing Provider  aspirin EC 81 MG tablet Take 81 mg by mouth daily.   Yes [provider]  celecoxib (CELEBREX) 100 MG capsule Take 1 capsule by mouth 2 (two) times daily as needed for pain. 10/17/16  Yes [provider]  Coenzyme Q10 200 MG capsule Take 200 mg by mouth every evening.   Yes [provider]  docusate sodium (COLACE) 100 MG capsule Take 200 mg by mouth daily.   Yes [provider]  gabapentin (NEURONTIN) 300 MG capsule Take 600 mg by mouth at bedtime.    Yes [provider]  gemfibrozil (LOPID) 600 MG tablet Take 600 mg by mouth 2 (two) times daily. 08/24/16  Yes [provider]  Multiple Vitamin (MULTIVITAMIN WITH MINERALS) TABS tablet Take 1 tablet by mouth daily. Centrum Silver   Yes [provider]  olmesartan (BENICAR) 40 MG tablet Take 40 mg by mouth daily. 07/26/15  Yes [provider]  Omega-3 Fatty Acids (FISH OIL) 1200 MG CAPS Take 1,200-2,400 mg by mouth 2 (two) times daily. 2400 mg in the morning & 1200 mg in the evening.   Yes [provider]  omeprazole (PRILOSEC) 20 MG capsule Take 20 mg by mouth 2 (two) times daily.     Yes [provider]  pravastatin  (PRAVACHOL) 20 MG tablet Take 20 mg by mouth every evening.    Yes [provider]  traZODone (DESYREL) 50 MG tablet Take 1 tablet (50 mg total) by mouth at bedtime as needed for sleep. 01/20/17  Yes Kathie Dike, MD  aspirin EC 325 MG tablet Take 1 tablet (325 mg total) by mouth daily. Patient not taking: Reported on 03/06/2017 01/20/17   Kathie Dike, MD  ciprofloxacin (CIPRO) 500 MG tablet Take 1 tablet (500 mg total) by mouth 2 (two) times daily. One po bid x 7 days 03/06/17   Julianne Rice, MD  HYDROcodone-acetaminophen (NORCO/VICODIN) 5-325 MG tablet Take 1-2 tablets by mouth every 4 (four) hours as needed for severe pain. 03/06/17   Julianne Rice, MD  methocarbamol (ROBAXIN) 500 MG tablet Take 1 tablet (500 mg total) by mouth every 8 (eight) hours as needed for muscle spasms. Patient not taking: Reported on 03/06/2017 10/03/16   Wyatt Portela L, PA-C  metroNIDAZOLE (FLAGYL) 500 MG tablet Take 1 tablet (500 mg total) by mouth 2 (two) times daily. One po bid x 7 days 03/06/17   Julianne Rice, MD    Family History Family History  Problem Relation Age of Onset  . Cancer Brother        unknown type  . Cancer Sister        unknown type  . Colon cancer Neg Hx     Social History Social History  Substance Use Topics  . Smoking status: Former Smoker    Types: Cigarettes    Quit date: 05/22/1981  . Smokeless tobacco: Never Used  . Alcohol use No     Allergies   Patient has no known allergies.   Review of Systems Review of Systems  Constitutional: Negative for chills, fatigue and fever.  Respiratory: Negative for shortness of breath.   Cardiovascular: Negative for chest pain.  Gastrointestinal: Positive for abdominal pain and diarrhea. Negative for blood in stool, nausea and vomiting.  Genitourinary: Negative for flank pain and frequency.  Musculoskeletal: Negative for back pain, myalgias, neck pain and neck stiffness.  Skin: Negative for rash and wound.    Neurological: Negative for dizziness, weakness, light-headedness, numbness and headaches.  All other systems reviewed and are negative.    Physical Exam Updated Vital Signs BP (!) 105/59 (BP Location: Right Arm)   Pulse 80   Temp 99 F (37.2 C) (Oral)   Resp 16   Wt 108.4 kg (239 lb)   SpO2 96%   BMI 34.29 kg/m   Physical Exam  Constitutional: He is oriented to person, place, and time. He appears well-developed and well-nourished. No distress.  HENT:  Head: Normocephalic and atraumatic.  Mouth/Throat: Oropharynx is clear and moist. No oropharyngeal exudate.  Eyes: Pupils are equal, round, and reactive to light. EOM are normal.  Neck: Normal range of motion. Neck supple.  Cardiovascular: Normal rate and regular  rhythm.  Exam reveals no gallop and no friction rub.   No murmur heard. Pulmonary/Chest: Effort normal and breath sounds normal. No respiratory distress. He has no wheezes. He has no rales. He exhibits no tenderness.  Abdominal: Soft. Bowel sounds are normal. There is tenderness. There is no rebound and no guarding.  Mild left lower quadrant tenderness to palpation. No guarding. Questionable mild rebound tenderness.  Musculoskeletal: Normal range of motion. He exhibits no edema or tenderness.  Neurological: He is alert and oriented to person, place, and time.  Skin: Skin is warm and dry. No rash noted. No erythema.  Psychiatric: He has a normal mood and affect. His behavior is normal.  Nursing note and vitals reviewed.    ED Treatments / Results  Labs (all labs ordered are listed, but only abnormal results are displayed) Labs Reviewed  COMPREHENSIVE METABOLIC PANEL - Abnormal; Notable for the following:       Result Value   Sodium 133 (*)    Glucose, Bld 131 (*)    BUN 31 (*)    Creatinine, Ser 1.43 (*)    AST 43 (*)    GFR calc non Af Amer 48 (*)    GFR calc Af Amer 56 (*)    All other components within normal limits  CBC - Abnormal; Notable for the  following:    RDW 16.1 (*)    All other components within normal limits  URINALYSIS, ROUTINE W REFLEX MICROSCOPIC - Abnormal; Notable for the following:    Specific Gravity, Urine 1.036 (*)    All other components within normal limits  LIPASE, BLOOD    EKG  EKG Interpretation None       Radiology No results found.  Procedures Procedures (including critical care time)  Medications Ordered in ED Medications  ciprofloxacin (CIPRO) IVPB 400 mg (not administered)  metroNIDAZOLE (FLAGYL) IVPB 500 mg (not administered)  sodium chloride 0.9 % bolus 1,000 mL (1,000 mLs Intravenous New Bag/Given 03/06/17 1500)     Initial Impression / Assessment and Plan / ED Course  I have reviewed the triage vital signs and the nursing notes.  Pertinent labs & imaging results that were available during my care of the patient were reviewed by me and considered in my medical decision making (see chart for details).     Patient is well-appearing. Reviewed CT from [facility]. Discussed with Dr. Arnoldo Morale. Does not believe fluid collection is large enough to drain at this point. Suggested starting the patient back on antibiotics. Suggested Cipro and Flagyl. Also give IV fluids. Patient is to call Dr. Arnoldo Morale office on Monday morning to arrange follow-up.  Final Clinical Impressions(s) / ED Diagnoses   Final diagnoses:  Diverticulitis of large intestine with perforation and abscess without bleeding    New Prescriptions New Prescriptions   CIPROFLOXACIN (CIPRO) 500 MG TABLET    Take 1 tablet (500 mg total) by mouth 2 (two) times daily. One po bid x 7 days   METRONIDAZOLE (FLAGYL) 500 MG TABLET    Take 1 tablet (500 mg total) by mouth 2 (two) times daily. One po bid x 7 days     Julianne Rice, MD 03/06/17 1506

## 2017-03-06 NOTE — ED Triage Notes (Signed)
Pt had outpatient CT scan at [facility] this am. Reports he was called by Dr. Nolon Rod office and told to come to ED for further work up.

## 2017-03-06 NOTE — ED Notes (Signed)
Unsuccessful attempts to start an IV.

## 2017-03-10 ENCOUNTER — Ambulatory Visit: Payer: Medicare Other | Admitting: General Surgery

## 2017-03-12 ENCOUNTER — Ambulatory Visit (INDEPENDENT_AMBULATORY_CARE_PROVIDER_SITE_OTHER): Payer: Medicare Other | Admitting: General Surgery

## 2017-03-12 ENCOUNTER — Encounter: Payer: Self-pay | Admitting: General Surgery

## 2017-03-12 VITALS — BP 103/64 | HR 66 | Temp 98.5°F | Resp 18 | Ht 70.0 in | Wt 237.0 lb

## 2017-03-12 DIAGNOSIS — K5720 Diverticulitis of large intestine with perforation and abscess without bleeding: Secondary | ICD-10-CM | POA: Diagnosis not present

## 2017-03-12 NOTE — Progress Notes (Signed)
Subjective:     Dakota Thompson  Was seen in the emergency room 6 days ago for recurrent pain. CT scan of the abdomen done at [facility] showed a 4 cm abscess just outside the sigmoid colon which was contained. The patient had no white blood cell count. He was started on a new round of antibiotics. He states he has not had pain since then. He denies any fever or chills. Denies any diarrhea. He states he feels much better. Objective:    BP 103/64   Pulse 66   Temp 98.5 F (36.9 C)   Resp 18   Ht 5\' 10"  (1.778 m)   Wt 237 lb (107.5 kg)   BMI 34.01 kg/m   General:  alert, cooperative and no distress  Abdomen is soft, nontender, nondistended. No rigidity noted. CT scan results reviewed. ER notes reviewed.     Assessment:    Sigmoid diverticulitis with contained perforation, slowly resolving. Clinically, patient feels much better.    Plan:   Finish antibiotic course. Follow-up here in 2 weeks.

## 2017-03-31 ENCOUNTER — Ambulatory Visit: Payer: Medicare Other | Admitting: General Surgery

## 2017-09-09 ENCOUNTER — Other Ambulatory Visit (HOSPITAL_COMMUNITY): Payer: Self-pay | Admitting: Internal Medicine

## 2017-09-09 ENCOUNTER — Ambulatory Visit (HOSPITAL_COMMUNITY)
Admission: RE | Admit: 2017-09-09 | Discharge: 2017-09-09 | Disposition: A | Discharge status: Home / Self Care | Payer: Medicare Other | Source: Ambulatory Visit | Attending: Internal Medicine | Admitting: Internal Medicine

## 2017-09-09 DIAGNOSIS — R0600 Dyspnea, unspecified: Secondary | ICD-10-CM

## 2017-09-09 DIAGNOSIS — Z1389 Encounter for screening for other disorder: Secondary | ICD-10-CM | POA: Insufficient documentation

## 2017-09-10 ENCOUNTER — Encounter: Payer: Self-pay | Admitting: Internal Medicine

## 2017-09-15 ENCOUNTER — Encounter: Payer: Self-pay | Admitting: Gastroenterology

## 2017-09-16 NOTE — Progress Notes (Signed)
Primary Care Physician:  Redmond School, MD Primary Gastroenterologist:  Dr. Gala Romney   Chief Complaint  Patient presents with  . Rectal Bleeding    happened beginning of March. Had spotting a few times since  . left side pain    x 3-4 weeks  . Colonoscopy    consult    HPI:   Dakota Thompson is a 72 y.o. male presenting today at the request of Dr. Gerarda Fraction secondary to rectal bleeding and abdominal pain. He was last seen by this practice in Aug 2016. History of sigmoid diverticulitis with contained perforation in July 2018, hospitalized and treated with antibiotic course. He saw Dr. Arnoldo Morale as outpatient in Sept 2018 after ED presentation with recurrent symptoms, and a CT at [facility] was done with contained perforation, and he was asked to return in 2 weeks but did not. He completed a course of Cipro and Flagyl at that time. He also has a remote history of tubovillous adenoma in past. Last colonoscopy in September 2016 with tubular adenoma and colonic diverticulosis, with surveillance due in 2021. He was at [facility] September 15, 2017 (just 2 days ago from this visit) and had CT abd/pelvis with contrast. This showed diverticulosis of sigmoid colon but without any evidence of diverticulitis. Previously seen pericolonic fluid and gas collection from last year had resolved.   Noting LLQ pain for 3 weeks, constant. 4/10. Noting right lower rib pain and back pain but has been doing some heavy lifting. Sometimes has to strain. BM usually daily. Sometimes has to strain but mainly soft. Doesn't feel as productive. Doesn't feel like pain is relieved after BM. No fever or chills. Has had two different episodes of rectal bleeding, starting in March. Painless moderate volume hematochezia, staining underwear and pants. Occurred shortly after finishing BM. Has been belching every day. Changed from omeprazole to Protonix BID a few weeks ago. Wife is concerned about stomach/esophageal cancer, stating her first  husband and another family member succumbed to an upper GI malignancy. Was taking omeprazole BID in the past but not on an empty stomach. No dysphagia.   Good appetite. No N/V. No weight loss. Celebrex BID. Has been on iron for 6 months, told by PCP he was anemic. States recent blood work was completed.   Has 11 siblings. Brother and sister both with colon cancer, age greater than 12.   Past Medical History:  Diagnosis Date  . Anxiety   . 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 GERD (gastroesophageal reflux disease)   . Hx of adenomatous colonic polyps    tubulovillous adenoma  . Hypertension   . Pancytopenia    mild  . Pancytopenia 02/27/2011  . Restless leg syndrome   . Sleep apnea    wears CPAP/BIPAP every night  . Thyroid disease   . Viral syndrome    Hx  . Viral syndrome 02/27/2011    Past Surgical History:  Procedure Laterality Date  . COLONOSCOPY  01/02/2010   RMR: normal rectum and colon. marginal prep compromised exam.   . COLONOSCOPY N/A 03/15/2015   Dr. Gala Romney: tubular adenoma, colonic diverticulosis, surveillance due 2021  . ESOPHAGOGASTRODUODENOSCOPY N/A 03/15/2015   2 cm hiatal hernia, otherwise normal  . EYE SURGERY     bilateral cataracts  . GANGLION CYST EXCISION    . knee cartilage repair  01/31/11  . NASAL SINUS SURGERY    . TOTAL KNEE ARTHROPLASTY  Right 10/03/2016   Procedure: RIGHT TOTAL KNEE ARTHROPLASTY;  Surgeon: Sydnee Cabal, MD;  Location: [facility];  Service: Orthopedics;  Laterality: Right;    Current Outpatient Medications  Medication Sig Dispense Refill  . aspirin EC 81 MG tablet Take 81 mg by mouth daily.    . celecoxib (CELEBREX) 100 MG capsule Take 1 capsule by mouth 2 (two) times daily as needed for pain.  3  . Coenzyme Q10 200 MG capsule Take 200 mg by mouth every evening.    . docusate sodium (COLACE) 100 MG capsule Take 200 mg by mouth daily.    . ferrous sulfate 325 (65 FE) MG EC tablet  Take 325 mg by mouth daily.    Marland Kitchen gabapentin (NEURONTIN) 300 MG capsule Take 300 mg by mouth 2 (two) times daily.     . Multiple Vitamin (MULTIVITAMIN WITH MINERALS) TABS tablet Take 1 tablet by mouth daily. Centrum Silver    . olmesartan (BENICAR) 40 MG tablet Take 40 mg by mouth daily.  3  . Omega-3 Fatty Acids (FISH OIL) 1200 MG CAPS Take 1,200-2,400 mg by mouth 2 (two) times daily. 2400 mg in the morning & 1200 mg in the evening.    . pantoprazole (PROTONIX) 40 MG tablet Take 40 mg by mouth 2 (two) times daily.    . pravastatin (PRAVACHOL) 20 MG tablet Take 20 mg by mouth every evening.     . traZODone (DESYREL) 50 MG tablet Take 1 tablet (50 mg total) by mouth at bedtime as needed for sleep. 30 tablet 0  . ciprofloxacin (CIPRO) 500 MG tablet Take 1 tablet (500 mg total) by mouth 2 (two) times daily. 14 tablet 0  . metroNIDAZOLE (FLAGYL) 500 MG tablet Take 1 tablet (500 mg total) by mouth 3 (three) times daily. 21 tablet 0   No current facility-administered medications for this visit.     Allergies as of 09/17/2017  . (No Known Allergies)    Family History  Problem Relation Age of Onset  . Colon cancer Brother        older than 50   . Colon cancer Sister        older than 44     Social History   Socioeconomic History  . Marital status: Married    Spouse name: Adela Lank   . Number of children: 2  . Years of education: Not on file  . Highest education level: Not on file  Occupational History  . Occupation: Retired    Fish farm manager: RETIRED  Social Needs  . Financial resource strain: Not on file  . Food insecurity:    Worry: Not on file    Inability: Not on file  . Transportation needs:    Medical: Not on file    Non-medical: Not on file  Tobacco Use  . Smoking status: Former Smoker    Types: Cigarettes    Last attempt to quit: 05/22/1981    Years since quitting: 36.3  . Smokeless tobacco: Never Used  Substance and Sexual Activity  . Alcohol use: No  . Drug use: No  .  Sexual activity: Yes  Lifestyle  . Physical activity:    Days per week: Not on file    Minutes per session: Not on file  . Stress: Not on file  Relationships  . Social connections:    Talks on phone: Not on file    Gets together: Not on file    Attends religious service: Not on file    Active  member of club or organization: Not on file    Attends meetings of clubs or organizations: Not on file    Relationship status: Not on file  . Intimate partner violence:    Fear of current or ex partner: Not on file    Emotionally abused: Not on file    Physically abused: Not on file    Forced sexual activity: Not on file  Other Topics Concern  . Not on file  Social History Narrative   Patient lives at home with his wife.    Patient has 2 children.    Patient is retired.     Review of Systems: Gen: Denies any fever, chills, fatigue, weight loss, lack of appetite.  CV: Denies chest pain, heart palpitations, peripheral edema, syncope.  Resp: Denies shortness of breath at rest or with exertion. Denies wheezing or cough.  GI: see HPI  GU : Denies urinary burning, urinary frequency, urinary hesitancy MS: Denies joint pain, muscle weakness, cramps, or limitation of movement.  Derm: Denies rash, itching, dry skin Psych: Denies depression, anxiety, memory loss, and confusion Heme: see HPI   Physical Exam: BP 128/72   Pulse 67   Temp (!) 97 F (36.1 C) (Oral)   Ht 5\' 10"  (1.778 m)   Wt 250 lb 6.4 oz (113.6 kg)   BMI 35.93 kg/m  General:   Alert and oriented. Pleasant and cooperative. Well-nourished and well-developed.  Head:  Normocephalic and atraumatic. Eyes:  Without icterus, sclera clear and conjunctiva pink.  Ears:  Normal auditory acuity. Nose:  No deformity, discharge,  or lesions. Mouth:  No deformity or lesions, oral mucosa pink.  Lungs:  Clear to auscultation bilaterally. No wheezes, rales, or rhonchi. No distress.  Heart:  S1, S2 present without murmurs appreciated.    Abdomen:  +BS, soft, mild LLQ TTP  and non-distended. No HSM noted. No guarding or rebound. No masses appreciated.  Rectal:  Deferred  Msk:  Symmetrical without gross deformities. Normal posture. Extremities:  Without  edema. Neurologic:  Alert and  oriented x4 Psych:  Alert and cooperative. Normal mood and affect.

## 2017-09-17 ENCOUNTER — Telehealth: Payer: Self-pay | Admitting: *Deleted

## 2017-09-17 ENCOUNTER — Ambulatory Visit: Payer: Medicare Other | Admitting: Gastroenterology

## 2017-09-17 ENCOUNTER — Other Ambulatory Visit: Payer: Self-pay | Admitting: *Deleted

## 2017-09-17 ENCOUNTER — Encounter: Payer: Self-pay | Admitting: Gastroenterology

## 2017-09-17 ENCOUNTER — Encounter: Payer: Self-pay | Admitting: *Deleted

## 2017-09-17 VITALS — BP 128/72 | HR 67 | Temp 97.0°F | Ht 70.0 in | Wt 250.4 lb

## 2017-09-17 DIAGNOSIS — Z86010 Personal history of colonic polyps: Secondary | ICD-10-CM | POA: Diagnosis not present

## 2017-09-17 DIAGNOSIS — K219 Gastro-esophageal reflux disease without esophagitis: Secondary | ICD-10-CM

## 2017-09-17 DIAGNOSIS — K5780 Diverticulitis of intestine, part unspecified, with perforation and abscess without bleeding: Secondary | ICD-10-CM | POA: Diagnosis not present

## 2017-09-17 DIAGNOSIS — K625 Hemorrhage of anus and rectum: Secondary | ICD-10-CM

## 2017-09-17 MED ORDER — METRONIDAZOLE 500 MG PO TABS: 500.0000 mg | ORAL_TABLET | Freq: Three times a day (TID) | ORAL | 0 refills | Status: DC

## 2017-09-17 MED ORDER — CIPROFLOXACIN HCL 500 MG PO TABS: 500.0000 mg | ORAL_TABLET | Freq: Two times a day (BID) | ORAL | 0 refills | Status: DC

## 2017-09-17 MED ORDER — PEG 3350-KCL-NA BICARB-NACL 420 G PO SOLR: 4000.0000 mL | Freq: Once | ORAL | 0 refills | Status: AC

## 2017-09-17 NOTE — Assessment & Plan Note (Addendum)
72 year old male with initial contained perforation in July 2018, hospitalized and improving clinically for discharge. Recurrent symptoms in Sept 2018 with outside CT at [facility] with fluid collection but remained contained. Evaluated by Dr. Arnoldo Morale Sept 2018 as outpatient and not felt to be amenable to draining, and he was treated with another course of Cipro and Flagyl, managed as outpatient. He clinically improved until 3 weeks ago when he noted similar but LESS severe LLQ pain as prior episodes, presenting to [facility] 2 days ago with CT unrevealing. No evidence of acute diverticulitis, and previously seen pericolonic fluid and gas collection from September 2018 imaging had resolved.   He does note some vague constipation, and I wonder if this may be the underlying culprit here. However, he is very concerned about a recurrent diverticulitis flare and symptoms are similar to prior episodes although less severe. He has no improvement with pain after BM, no exacerbation with pain with movement/exertion. Could have smoldering diverticulitis, but CT is encouraging. Empirically providing a short course of Cipro and Flagyl, with instructions to call Monday with an update. In interim, follow soft diet, add Miralax each evening. Call if any worsening in pain.   Will plan on outpatient elective colonoscopy in the next 6-8 weeks, although this could change depending on clinical course. He has noted 2 episodes of painless hematochezia, unrelated to current LLQ pain. Last colonoscopy in 2016 with plans for surveillance in 2021. Notably, he has a history of tubulovillous adenoma in the remote past and reports both a sister and brother with colon cancer diagnosed (age greater than 14).  Proceed with TCS with Dr. Gala Romney in near future: the risks, benefits, and alternatives have been discussed with the patient in detail. The patient states understanding and desires to proceed. Propofol due to polypharmacy  Addendum 7/10:  In interim from appt when seen, he was traveling in [place] and told he had diverticulitis November 25, 2017. We were unable to retreive any of those records. However, CT ordered by PCP from July 5th without any acute or inflammatory process. He is at baseline for symptoms. Chronic low-volume hematochezia noted. No fever or chills. He is about 5 weeks out from ?possible last diverticulitis flare. CT done recently is encouraging. Wife and patient are anxious to pursue colonoscopy. He was doing well when I spoke with him recently.  Continue with plans for colonoscopy/EGD.

## 2017-09-17 NOTE — Patient Instructions (Addendum)
We have scheduled you for a colonoscopy and possible upper endoscopy with Dr. Gala Romney.  I have sent in Cipro and Flagyl to take for 7 days. I would like for you to add Miralax each evening, so we can avoid constipation and straining. Follow a soft diet for the next 2 weeks.   For reflux: continue Protonix twice a day, but make sure it is 30 minutes before breakfast and dinner. This medication is most effective when taking on an empty stomach. I have included the reflux handout as well.   It was a pleasure to see you today. I strive to create trusting relationships with patients to provide genuine, compassionate, and quality care. I value your feedback. If you receive a survey regarding your visit,  I greatly appreciate you taking time to fill this out.   Dakota Needs, PhD, ANP-BC [facility], [facility], [facility]    Gastroesophageal Reflux Disease, Adult Normally, food travels down the esophagus and stays in the stomach to be digested. However, when a person has gastroesophageal reflux disease (GERD), food and stomach acid move back up into the esophagus. When this happens, the esophagus becomes sore and inflamed. Over time, GERD can create small holes (ulcers) in the lining of the esophagus. What are the causes? This condition is caused by a problem with the muscle between the esophagus and the stomach (lower esophageal sphincter, or LES). Normally, the LES muscle closes after food passes through the esophagus to the stomach. When the LES is weakened or abnormal, it does not close properly, and that allows food and stomach acid to go back up into the esophagus. The LES can be weakened by certain dietary substances, medicines, and medical conditions, including:  Tobacco use.  Pregnancy.  Having a hiatal hernia.  Heavy alcohol use.  Certain foods and beverages, such as coffee, chocolate, onions, and peppermint.  What increases the risk? This condition is more likely to develop in:  People who  have an increased body weight.  People who have connective tissue disorders.  People who use NSAID medicines.  What are the signs or symptoms? Symptoms of this condition include:  Heartburn.  Difficult or painful swallowing.  The feeling of having a lump in the throat.  Abitter taste in the mouth.  Bad breath.  Having a large amount of saliva.  Having an upset or bloated stomach.  Belching.  Chest pain.  Shortness of breath or wheezing.  Ongoing (chronic) cough or a night-time cough.  Wearing away of tooth enamel.  Weight loss.  Different conditions can cause chest pain. Make sure to see your health care provider if you experience chest pain. How is this diagnosed? Your health care provider will take a medical history and perform a physical exam. To determine if you have mild or severe GERD, your health care provider may also monitor how you respond to treatment. You may also have other tests, including:  An endoscopy toexamine your stomach and esophagus with a small camera.  A test thatmeasures the acidity level in your esophagus.  A test thatmeasures how much pressure is on your esophagus.  A barium swallow or modified barium swallow to show the shape, size, and functioning of your esophagus.  How is this treated? The goal of treatment is to help relieve your symptoms and to prevent complications. Treatment for this condition may vary depending on how severe your symptoms are. Your health care provider may recommend:  Changes to your diet.  Medicine.  Surgery.  Follow these instructions  at home: Diet  Follow a diet as recommended by your health care provider. This may involve avoiding foods and drinks such as: ? Coffee and tea (with or without caffeine). ? Drinks that containalcohol. ? Energy drinks and sports drinks. ? Carbonated drinks or sodas. ? Chocolate and cocoa. ? Peppermint and mint flavorings. ? Garlic and  onions. ? Horseradish. ? Spicy and acidic foods, including peppers, chili powder, curry powder, vinegar, hot sauces, and barbecue sauce. ? Citrus fruit juices and citrus fruits, such as oranges, lemons, and limes. ? Tomato-based foods, such as red sauce, chili, salsa, and pizza with red sauce. ? Fried and fatty foods, such as donuts, french fries, potato chips, and high-fat dressings. ? High-fat meats, such as hot dogs and fatty cuts of red and white meats, such as rib eye steak, sausage, ham, and bacon. ? High-fat dairy items, such as whole milk, butter, and cream cheese.  Eat small, frequent meals instead of large meals.  Avoid drinking large amounts of liquid with your meals.  Avoid eating meals during the 2-3 hours before bedtime.  Avoid lying down right after you eat.  Do not exercise right after you eat. General instructions  Pay attention to any changes in your symptoms.  Take over-the-counter and prescription medicines only as told by your health care provider. Do not take aspirin, ibuprofen, or other NSAIDs unless your health care provider told you to do so.  Do not use any tobacco products, including cigarettes, chewing tobacco, and e-cigarettes. If you need help quitting, ask your health care provider.  Wear loose-fitting clothing. Do not wear anything tight around your waist that causes pressure on your abdomen.  Raise (elevate) the head of your bed 6 inches (15cm).  Try to reduce your stress, such as with yoga or meditation. If you need help reducing stress, ask your health care provider.  If you are overweight, reduce your weight to an amount that is healthy for you. Ask your health care provider for guidance about a safe weight loss goal.  Keep all follow-up visits as told by your health care provider. This is important. Contact a health care provider if:  You have new symptoms.  You have unexplained weight loss.  You have difficulty swallowing, or it hurts to  swallow.  You have wheezing or a persistent cough.  Your symptoms do not improve with treatment.  You have a hoarse voice. Get help right away if:  You have pain in your arms, neck, jaw, teeth, or back.  You feel sweaty, dizzy, or light-headed.  You have chest pain or shortness of breath.  You vomit and your vomit looks like blood or coffee grounds.  You faint.  Your stool is bloody or black.  You cannot swallow, drink, or eat. This information is not intended to replace advice given to you by your health care provider. Make sure you discuss any questions you have with your health care provider. Document Released: 03/19/2005 Document Revised: 11/07/2015 Document Reviewed: 10/04/2014 Elsevier Interactive Patient Education  [employer].

## 2017-09-17 NOTE — Assessment & Plan Note (Signed)
Long-standing GERD, previously on prilosec BID but he is not taking appropriately on an empty stomach. Noting new onset belching, but I feel this is more related to PPI dosing, dietary measures, and weight gain. EGD in 2016 unrevealing. His wife is quite concerned, as her first husband and another close family member (on her side) passed away from upper GI malignancies (esophageal and gastric). We discussed the multi-faceted GERD treatment approach, and he has no alarm symptoms. Regardless, they are requesting endoscopic evaluation at time of colonoscopy. I have discussed appropriate BID Protonix dosing and provided dietary/behavior modification information. Discussed possible EGD at time of colonoscopy if no improvement with upper GI symptoms.

## 2017-09-17 NOTE — Progress Notes (Signed)
cc'ed to pcp °

## 2017-09-17 NOTE — Assessment & Plan Note (Signed)
Early interval surveillance now due to rectal bleeding, +FH of colon cancer in both brother and sister.

## 2017-09-17 NOTE — Telephone Encounter (Signed)
Pre-op is scheduled for 10/19/17 at 11:00am. Spouse is aware. Letter mailed.

## 2017-09-21 ENCOUNTER — Telehealth: Payer: Self-pay | Admitting: Internal Medicine

## 2017-09-21 NOTE — Telephone Encounter (Signed)
[id number] patient taking the antibiotics he was given and now he is having rectal bleeding

## 2017-09-21 NOTE — Telephone Encounter (Signed)
Lmom, waiting on a return call.  

## 2017-09-22 MED ORDER — HYDROCORTISONE 2.5 % RE CREA: 1.0000 "application " | TOPICAL_CREAM | Freq: Two times a day (BID) | RECTAL | 1 refills | Status: DC

## 2017-09-22 MED ORDER — HYDROCORTISONE ACETATE 25 MG RE SUPP: 25.0000 mg | Freq: Two times a day (BID) | RECTAL | 1 refills | Status: DC

## 2017-09-22 NOTE — Addendum Note (Signed)
Addended by: Annitta Needs on: 09/22/2017 03:53 PM   Modules accepted: Orders

## 2017-09-22 NOTE — Telephone Encounter (Signed)
Continue to monitor. Likely benign source. If having no pain and only small amount, continue to monitor. Sending in anusol to use BID per rectum.   Abdominal discomfort improved?

## 2017-09-22 NOTE — Telephone Encounter (Signed)
Pt returned call. Pt noticed some blood on his underwear yesterday after having three bowel movements s/p Miralax. Pt didn't feel like blood was pouring out and he kept an eye on it. Today September 22, 2017, pt ad a bowel movement and noticed only a drop of blood. Bleeding improved from yesterday. Pt reports no pain, no blood seen in toilet or changes with how his body feels.

## 2017-09-22 NOTE — Addendum Note (Signed)
Addended by: Annitta Needs on: 09/22/2017 05:17 PM   Modules accepted: Orders

## 2017-09-22 NOTE — Telephone Encounter (Signed)
Done

## 2017-09-22 NOTE — Telephone Encounter (Signed)
Pt would like a suppository instead of a cream. Pts abdomen pain is not unbearable but still present at times. Pt said it is some better.

## 2017-09-24 ENCOUNTER — Encounter: Payer: Self-pay | Admitting: Neurology

## 2017-09-24 ENCOUNTER — Ambulatory Visit: Payer: Medicare Other | Admitting: Neurology

## 2017-09-24 VITALS — BP 124/71 | HR 75 | Ht 70.0 in | Wt 247.0 lb

## 2017-09-24 DIAGNOSIS — G4733 Obstructive sleep apnea (adult) (pediatric): Secondary | ICD-10-CM | POA: Diagnosis not present

## 2017-09-24 NOTE — Progress Notes (Signed)
Subjective:    Patient ID: Dakota Thompson is a 72 y.o. male.  HPI     Interim history:   Mr. Edgin is a 72 year old right-handed gentleman with an underlying medical history of neuropathy, obesity and restless leg syndrome and mild memory loss, who presents for follow-up consultation of his obstructive sleep apnea, treated with BiPAP. The patient is accompanied by his wife today. I last saw him on 09/18/2016, at which time he reported doing okay with his BiPAP. His memory was stable. He reported bilateral knee pain and was supposed to have a right knee replacement surgery.   Today, 09/24/2017: I reviewed his BiPAP compliance data from 08/22/2017 through 09/20/2017, which is a total of 30 days, during which time he used his PAP every night with percent used days greater than 4 hours at 100%, indicating superb compliance with an average usage of 8 hours and 35 minutes, residual AHI at goal at 1.1 per hour, leak on the lower end, pressure of 12/8 cm. He reports doing well with his BiPAP. Of note, he had an interim hospitalization in July 2018 for perforated sigmoid diverticulitis. He had right total knee replacement surgery on 10/03/2016. He presented to the emergency room on 03/06/2017 with abdominal pain and was treated for suspected sigmoid diverticulitis. He is currently again on another round of antibiotics which he is going to finish tomorrow. He had flareup of his abdominal pain.  The patient's allergies, current medications, family history, past medical history, past social history, past surgical history and problem list were reviewed and updated as appropriate.   Previously (copied from previous notes for reference):   I saw him on 09/13/2015, at which time he was fully compliant with BiPAP. He had gained a little bit of weight. We talked about his cognitive test results at the time. He had seen Dr. Valentina Shaggy on 06/11/2015 and then for discussion on 06/21/2015, diagnosis was memory loss. Repeat  cognitive testing was recommended after one or 2 years for comparison.    I reviewed his BiPAP compliance from 08/19/2016 through 09/17/2016 which is a total of 30 days, during which time he used his machine every night with percent used days greater than 4 hours at 100%, indicating superb compliance with an average usage of 8 hours and 24 minutes, residual AHI low at 1.7 per hour, pressure of 12/8, leak very low.    I saw him on 03/08/2015, at which time he reported being able to tolerate BiPAP a little bit better. Memory-wise, he felt a little worse. He had knee pain. I reviewed his brain MRI from 03/07/2014 through the PACS system today. I suggested we pursue formal neuropsychological evaluation. I referred him to Dr. Valentina Shaggy.    I reviewed his BiPAP compliance data from 08/14/2015 through 09/12/2015 which is a total of 30 days during which time he used his machine every night with percent used days greater than 4 hours at 100%, indicating superb compliance with an average usage of 8 hours and 13 minutes, residual AHI 1.2 per hour, leak low, pressure at 12/8 cm.   I saw him on 09/15/2014, at which time he reported R cataract surgery on 08/03/14 and L cataract repair on 08/31/14 with good results. His wife was worried about his lack of mobility. He was not exercising. He was reporting knee pain. He was not drinking enough water. He was struggling with BiPAP treatment. His wife had noted less twitching of his legs and less snoring and he seemed to sleep  more consolidated. As far as his memory, his long-term memory much improved either. He felt he had issues with OCD. He could not tolerate the chin strap.     I reviewed his BiPAP compliance data from 02/06/2015 through 03/07/2015 which is a total of 30 days during which time he used his machine every night with percent used days greater than 4 hours at 100%, indicating superb compliance with an average usage of 7 hours and 36 minutes, pressure of 12/8,  residual AHI 1.3, leaked low.    He had a brain MRI wo contrast on 03/08/15: Mild cerebral atrophy, otherwise unremarkable appearance of the brain.   I first met him on 12/30/2013 at the request of Dr. Janann Colonel, at which time we talked about his recent sleep test results including his baseline sleep study from March 2015 as well as a CPAP titration study from April 2015 and we talked about his compliance data on how he felt. He reported sleeping better and he was twitching less during his sleep. In the interim, he was seen by Dr. 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 His MOCA score was 27 at the time.   I reviewed his BiPAP compliance data from 03/24/14 to 06/21/14, which is a total of 90 days, during which time he used his machine every night, with percent used days greater than 4 hours of 91.1%, indicating excellent compliance, with an average usage of 6 hours and 28 minutes, pressure at 12/8, residual AHI at 1.5 per hour, leak low.   I reviewed his BiPAP compliance data from 08/16/2014 through 09/14/2014 which is a total of 30 days during which time he used his machine every night with percent used days greater than 4 hours at 96.7%, indicating excellent compliance. Pressure setting the same, residual AHI low at 1.7 per hour, leak low. Average usage of 6 hours and 37 minutes for all nights.   His baseline sleep study from 09/06/2013 showed a sleep efficiency was reduced at 66.6% with a latency to sleep of 13.5 minutes and wake after sleep onset of 131 minutes with moderate sleep fragmentation noted. He had an elevated arousal index. He had an increased percentage of stage II sleep, 5.6% of slow-wave sleep, and 19.1% of REM sleep with a significantly reduced REM latency of 2 minutes. He had mild periodic leg movements at 17.9 per hour resulting in 4 arousals per hour. He had mild to moderate snoring. He did not achieve much in the way of supine sleep reporting that he's not able to sleep on his  back. He had one central apnea and 25 obstructive hypopneas with a total AHI of 5.4 per hour, rising to 9.8 per hour in REM sleep. Baseline oxygen saturation was only 90%, nadir was 84%. Time below 88% saturation was 8 minutes and 18 seconds. He was then requested to come back for a CPAP titration study to help his sleep disordered breathing and in light of his significant desaturations. He had a CPAP titration study on 10/06/2013 which showed a sleep efficiency of 65.4% with a prolonged sleep latency of 59.5 minutes and wake after sleep onset of 106.5 minutes with moderate sleep fragmentation noted. He had an elevated arousal index at 13.4 per hour primarily because of spontaneous arousals. He had an increased percentage of light stage sleep, 1.6% of slow-wave sleep, and a mildly decreased percentage of REM sleep at 17.5% with a prolonged REM latency. He had mild PLMS a 10.7 per hour with an associated  arousal index of 1.7 per hour. Baseline oxygen saturation was 91%, nadir was 88%. He was started on CPAP at 5 cm. However he was not able to tolerate this and felt panicked. He did not tolerate the nasal pillows mask or nasal mask and did somewhat better with a full facemask. He was switched to BiPAP for better tolerance of the pressures were titrated from 8/4 cm to 12/8 cm. His oxygen saturation appeared to be better on the final setting of 12/8 cm. REM sleep was achieved on the final pressure but very little supine sleep was achieved during the study. His AHI was 0 on the final pressure. Based on the test results I prescribed BiPAP for him.     I reviewed the patient's PAP compliance data from 11/02/13 to 12/01/2013, which is a total of 30 days, during which time the patient used BiPAP.  The average usage for all days was 6 hours and 29 minutes. The percent used days greater than 4 hours was 100 %, indicating superb compliance. The residual AHI was 2.1 per hour, indicating an adequate treatment pressure of 12/8  cwp with low leak reported.   His typical bedtime is reported to be around 11 PM. He reports that he does not really like using his BiPAP but he is getting used to it. Provides report he seems to sleep more soundly and is less restless in his sleep. He has not noted any improvement in his cognitive function yet. He is wondering if he will have to use this machine longer term and whether he has to keep using it.     His Past Medical History Is Significant For: Past Medical History:  Diagnosis Date  . Anxiety   . 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 GERD (gastroesophageal reflux disease)   . Hx of adenomatous colonic polyps    tubulovillous adenoma  . Hypertension   . Pancytopenia    mild  . Pancytopenia 02/27/2011  . Restless leg syndrome   . Sleep apnea    wears CPAP/BIPAP every night  . Thyroid disease   . Viral syndrome    Hx  . Viral syndrome 02/27/2011    His Past Surgical History Is Significant For: Past Surgical History:  Procedure Laterality Date  . COLONOSCOPY  01/02/2010   RMR: normal rectum and colon. marginal prep compromised exam.   . COLONOSCOPY N/A 03/15/2015   Dr. Gala Romney: tubular adenoma, colonic diverticulosis, surveillance due 2021  . ESOPHAGOGASTRODUODENOSCOPY N/A 03/15/2015   2 cm hiatal hernia, otherwise normal  . EYE SURGERY     bilateral cataracts  . GANGLION CYST EXCISION    . knee cartilage repair  01/31/11  . NASAL SINUS SURGERY    . TOTAL KNEE ARTHROPLASTY Right 10/03/2016   Procedure: RIGHT TOTAL KNEE ARTHROPLASTY;  Surgeon: Sydnee Cabal, MD;  Location: [facility];  Service: Orthopedics;  Laterality: Right;    His Family History Is Significant For: Family History  Problem Relation Age of Onset  . Colon cancer Brother        older than 2   . Colon cancer Sister        older than 85     His Social History Is Significant For: Social History   Socioeconomic History  . Marital status: Married     Spouse name: Adela Lank   . Number of children: 2  . Years of education: Not on file  . Highest education level: Not on  file  Occupational History  . Occupation: Retired    Fish farm manager: RETIRED  Social Needs  . Financial resource strain: Not on file  . Food insecurity:    Worry: Not on file    Inability: Not on file  . Transportation needs:    Medical: Not on file    Non-medical: Not on file  Tobacco Use  . Smoking status: Former Smoker    Types: Cigarettes    Last attempt to quit: 05/22/1981    Years since quitting: 36.3  . Smokeless tobacco: Never Used  Substance and Sexual Activity  . Alcohol use: No  . Drug use: No  . Sexual activity: Yes  Lifestyle  . Physical activity:    Days per week: Not on file    Minutes per session: Not on file  . Stress: Not on file  Relationships  . Social connections:    Talks on phone: Not on file    Gets together: Not on file    Attends religious service: Not on file    Active member of club or organization: Not on file    Attends meetings of clubs or organizations: Not on file    Relationship status: Not on file  Other Topics Concern  . Not on file  Social History Narrative   Patient lives at home with his wife.    Patient has 2 children.    Patient is retired.     His Allergies Are:  No Known Allergies:   His Current Medications Are:  Outpatient Encounter Medications as of 09/24/2017  Medication Sig  . aspirin EC 81 MG tablet Take 81 mg by mouth daily.  . celecoxib (CELEBREX) 100 MG capsule Take 1 capsule by mouth 2 (two) times daily as needed for pain.  . ciprofloxacin (CIPRO) 500 MG tablet Take 1 tablet (500 mg total) by mouth 2 (two) times daily.  . Coenzyme Q10 200 MG capsule Take 200 mg by mouth every evening.  . docusate sodium (COLACE) 100 MG capsule Take 200 mg by mouth daily.  . ferrous sulfate 325 (65 FE) MG EC tablet Take 325 mg by mouth daily.  Marland Kitchen gabapentin (NEURONTIN) 300 MG capsule Take 300 mg by mouth 2 (two)  times daily.   . hydrocortisone (ANUSOL-HC) 2.5 % rectal cream Place 1 application rectally 2 (two) times daily.  . hydrocortisone (ANUSOL-HC) 25 MG suppository Place 1 suppository (25 mg total) rectally every 12 (twelve) hours.  . metroNIDAZOLE (FLAGYL) 500 MG tablet Take 1 tablet (500 mg total) by mouth 3 (three) times daily.  . Multiple Vitamin (MULTIVITAMIN WITH MINERALS) TABS tablet Take 1 tablet by mouth daily. Centrum Silver  . olmesartan (BENICAR) 40 MG tablet Take 40 mg by mouth daily.  . Omega-3 Fatty Acids (FISH OIL) 1200 MG CAPS Take 1,200-2,400 mg by mouth 2 (two) times daily. 2400 mg in the morning & 1200 mg in the evening.  . pantoprazole (PROTONIX) 40 MG tablet Take 40 mg by mouth 2 (two) times daily.  . pravastatin (PRAVACHOL) 20 MG tablet Take 20 mg by mouth every evening.   . traZODone (DESYREL) 50 MG tablet Take 1 tablet (50 mg total) by mouth at bedtime as needed for sleep.   No facility-administered encounter medications on file as of 09/24/2017.   :  Review of Systems:  Out of a complete 14 point review of systems, all are reviewed and negative with the exception of these symptoms as listed below:  Review of Systems  Neurological:  Pt presents today to discuss his cpap. Pt declined a MMSE today.    Objective:  Neurological Exam  Physical Exam Physical Examination:   Vitals:   09/24/17 1451  BP: 124/71  Pulse: 75    General Examination: The patient is a very pleasant 72 y.o. male in no acute distress. He appears well-developed and well-nourished and well groomed.   HEENT: Normocephalic, atraumatic, pupils are equal, round and reactive to light and accommodation. Extraocular tracking is good without limitation to gaze excursion or nystagmus noted. Normal smooth pursuit is noted. Hearing is Mildly impaired. He has bilateral hearing aids in place. Face is symmetric with normal facial animation and normal facial sensation. Speech is clear with no dysarthria  noted. There is no hypophonia. There is no lip, neck/head, jaw or voice tremor. Neck is supple with full range of passive and active motion. There are no carotid bruits on auscultation. Oropharynx exam reveals: mild mouth dryness, adequate dental hygiene and moderate airway crowding.   Chest: Clear to auscultation without wheezing, rhonchi or crackles noted.  Heart: S1+S2+0, regular and normal without murmurs, rubs or gallops noted.   Abdomen: Soft, non-tender and non-distended.   Extremities: There is no pitting edema in the distal lower extremities bilaterally.   Skin: Warm and dry without trophic changes noted.  Musculoskeletal: exam reveals some residual right knee discomfort. Range of motion is good. Reports lower back stiffness.  Neurologically:  Mental status: The patient is awake, alert and oriented in all 4 spheres. His immediate and remote memory, attention, language skills and fund of knowledge are fairly good.  There is no evidence of aphasia, agnosia, apraxia or anomia. Speech is clear with normal prosody and enunciation. Thought process is linear. Mood is normal and affect is normal.   On 03/08/2015: [place] 26/30. On 09/18/2016: MMSE: 29/30, CDT: 4/4, AFT: 18/min.  Cranial nerves II - XII are as described above under HEENT exam. Motor exam: Normal bulk, strength and tone is noted. There is no drift, tremor or rebound. Romberg is not tested for safety. Fine motor skills and coordination: intact.  Cerebellar testing: No dysmetria or intention tremor. There is no truncal or gait ataxia.  Sensory exam: intact to light touch in the upper and lower extremities.  Gait, station and balance: He stands with mild difficulty, he walks with no obvious limp.  Assessment and Plan:  In summary, SANJIT MCMICHAEL is a very pleasant 72 year old male with an underlying medical history of hearing loss, cognitive complaints, restless leg syndrome, history of neuropathy, arthritis of both knees,  Diverticulitis with recent complications, and obesity, who presents for follow-up consultation of his obstructive sleep apnea. He has been stable on BiPAP therapy at a pressure of 12/8 cm with full compliance ongoing good results. Memory scores have been stable. He had neuropsychological testing in December 2016 with reassuring findings and we can certainly repeat in the future, if need be. Physical exam is stable, he had interim right knee replacement surgery in April 2018. He had interim issues with diverticulitis and is currently finishing up another round of antibiotics. He is followed by GI for this. From a sleep apnea standpoint, he is doing well. I placed a prescription for renewing his supplies. His DME company is in [place]. I suggested a 1 year follow-up. He can see one of our nurse practitioners at the time. I answered all their questions today and the patient and his wife were in agreement. I spent 20 minutes in total face-to-face time  with the patient, more than 50% of which was spent in counseling and coordination of care, reviewing test results, reviewing medication and discussing or reviewing the diagnosis of OSA, its prognosis and treatment options. Pertinent laboratory and imaging test results that were available during this visit with the patient were reviewed by me and considered in my medical decision making (see chart for details).

## 2017-09-24 NOTE — Patient Instructions (Addendum)
Please continue using your BiPAP regularly. While your insurance requires that you use BiPAP at least 4 hours each night on 70% of the nights, I recommend, that you not skip any nights and use it throughout the night if you can. Getting used to BiPAP and staying with the treatment long term does take time and patience and discipline. Untreated obstructive sleep apnea when it is moderate to severe can have an adverse impact on cardiovascular health and raise her risk for heart disease, arrhythmias, hypertension, congestive heart failure, stroke and diabetes. Untreated obstructive sleep apnea causes sleep disruption, nonrestorative sleep, and sleep deprivation. This can have an impact on your day to day functioning and cause daytime sleepiness and impairment of cognitive function, memory loss, mood disturbance, and problems focussing. Using BiPAP regularly can improve these symptoms. Keep up the good work!  We will see you back in 1 year. You can see you in one year. You can seen Jinny Blossom or Hoyle Sauer, one of our NPs.

## 2017-10-02 ENCOUNTER — Ambulatory Visit (INDEPENDENT_AMBULATORY_CARE_PROVIDER_SITE_OTHER): Payer: Medicare Other | Admitting: Internal Medicine

## 2017-10-02 ENCOUNTER — Telehealth: Payer: Self-pay | Admitting: Internal Medicine

## 2017-10-02 ENCOUNTER — Encounter: Payer: Self-pay | Admitting: Internal Medicine

## 2017-10-02 VITALS — BP 114/62 | HR 72 | Ht 70.0 in | Wt 249.0 lb

## 2017-10-02 DIAGNOSIS — I10 Essential (primary) hypertension: Secondary | ICD-10-CM | POA: Diagnosis not present

## 2017-10-02 DIAGNOSIS — R0609 Other forms of dyspnea: Secondary | ICD-10-CM | POA: Diagnosis not present

## 2017-10-02 DIAGNOSIS — E782 Mixed hyperlipidemia: Secondary | ICD-10-CM

## 2017-10-02 DIAGNOSIS — G4733 Obstructive sleep apnea (adult) (pediatric): Secondary | ICD-10-CM | POA: Diagnosis not present

## 2017-10-02 DIAGNOSIS — R1030 Lower abdominal pain, unspecified: Secondary | ICD-10-CM

## 2017-10-02 NOTE — Progress Notes (Signed)
Cardiology Office Note   Date:  10/02/2017   ID:  Dakota Thompson, DOB 12-28-1945, MRN [id number]  PCP:  Redmond School, MD  Cardiologist:   Dorris Carnes, MD    Pt referred for abnormal EKG, possible stress test by Dr Gerarda Fraction  History of Present Illness: Dakota Thompson is a 72 y.o. male with a history of SOB   Seen by Dr Gerarda Fraction in [place]   Pt not sure what caused it  Pt says he thinks it has gotten better   Wife and patient though say he gets SOB when does things   More pronounced than last summer   Pt mows grass (rides)  Repairs back porch  Tiling   Planting shrubs  Pt denies  CP    Wife does note he has excess belching  Pt being eval by Dr Sydell Axon for abdominal pain   Colonoscopy scheduled  Pt''s wife says he would be content sitting on sofa  Til he died    Worked his whole life    Ready to sit.     Current Meds  Medication Sig  . aspirin EC 81 MG tablet Take 81 mg by mouth daily.  . celecoxib (CELEBREX) 100 MG capsule Take 1 capsule by mouth 2 (two) times daily as needed for pain.  . Coenzyme Q10 200 MG capsule Take 200 mg by mouth every evening.  . docusate sodium (COLACE) 100 MG capsule Take 200 mg by mouth daily.  . ferrous sulfate 325 (65 FE) MG EC tablet Take 325 mg by mouth daily.  Marland Kitchen gabapentin (NEURONTIN) 300 MG capsule Take 300 mg by mouth 2 (two) times daily.   . hydrocortisone (ANUSOL-HC) 2.5 % rectal cream Place 1 application rectally 2 (two) times daily.  . hydrocortisone (ANUSOL-HC) 25 MG suppository Place 1 suppository (25 mg total) rectally every 12 (twelve) hours.  . Multiple Vitamin (MULTIVITAMIN WITH MINERALS) TABS tablet Take 1 tablet by mouth daily. Centrum Silver  . olmesartan (BENICAR) 40 MG tablet Take 40 mg by mouth daily.  . Omega-3 Fatty Acids (FISH OIL) 1200 MG CAPS Take 1,200-2,400 mg by mouth 2 (two) times daily. 2400 mg in the morning & 1200 mg in the evening.  . pantoprazole (PROTONIX) 40 MG tablet Take 40 mg by mouth 2 (two) times daily.  .  pravastatin (PRAVACHOL) 20 MG tablet Take 20 mg by mouth every evening.      Allergies:   Patient has no known allergies.   Past Medical History:  Diagnosis Date  . Anxiety   . 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 GERD (gastroesophageal reflux disease)   . Hx of adenomatous colonic polyps    tubulovillous adenoma  . Hypertension   . Pancytopenia    mild  . Pancytopenia 02/27/2011  . Restless leg syndrome   . Sleep apnea    wears CPAP/BIPAP every night  . Thyroid disease   . Viral syndrome    Hx  . Viral syndrome 02/27/2011    Past Surgical History:  Procedure Laterality Date  . COLONOSCOPY  01/02/2010   RMR: normal rectum and colon. marginal prep compromised exam.   . COLONOSCOPY N/A 03/15/2015   Dr. Gala Romney: tubular adenoma, colonic diverticulosis, surveillance due 2021  . ESOPHAGOGASTRODUODENOSCOPY N/A 03/15/2015   2 cm hiatal hernia, otherwise normal  . EYE SURGERY     bilateral cataracts  . GANGLION CYST EXCISION    . knee cartilage repair  01/31/11  .  NASAL SINUS SURGERY    . TOTAL KNEE ARTHROPLASTY Right 10/03/2016   Procedure: RIGHT TOTAL KNEE ARTHROPLASTY;  Surgeon: Sydnee Cabal, MD;  Location: [facility];  Service: Orthopedics;  Laterality: Right;     Social History:  The patient  reports that he quit smoking about 36 years ago. His smoking use included cigarettes. He has never used smokeless tobacco. He reports that he does not drink alcohol or use drugs.   Family History:  The patient's family history includes Colon cancer in his brother and sister.  Father died of COPD   Mother no cad   ROS:  Please see the history of present illness. All other systems are reviewed and  Negative to the above problem except as noted.    PHYSICAL EXAM: VS:  BP 114/62 (BP Location: Right Arm)   Pulse 72   Ht 5\' 10"  (1.778 m)   Wt 249 lb (112.9 kg)   SpO2 97%   BMI 35.73 kg/m   GEN: Morbidly obese 72 yo in no acute  distress  HEENT: normal  Neck: no JVD, carotid bruits, or masses Cardiac: RRR; no murmurs, rubs, or gallops,no edema  Respiratory:  clear to auscultation bilaterally, normal work of breathing GI: soft, nontender, nondistended, + BS  No hepatomegaly  MS: no deformity Moving all extremities   Skin: warm and dry, no rash Neuro:  Strength and sensation are intact Psych: euthymic mood, full affect   EKG:  EKG is not ordered today.  SR  In Dr Fusco's clinic      Lipid Panel No results found for: CHOL, TRIG, HDL, CHOLHDL, VLDL, LDLCALC, LDLDIRECT    Wt Readings from Last 3 Encounters:  10/02/17 249 lb (112.9 kg)  09/24/17 247 lb (112 kg)  09/17/17 250 lb 6.4 oz (113.6 kg)      ASSESSMENT AND PLAN:  PT is a 72 yo who presents for SOB/DOE   He is a very difficult historian  Wife is with him   Has had progressive SOB   He is not that motivated to do things which complicates eval   ALso notes increased belching  No CP  I would recomm a Lexiscan myovue to r/o inducible ischemia     2  HTN  BP is good   Keep on current meds  3   HL   Pt on statin   Follows with Dr Gerarda Fraction  4  Morbid obesity   Discussed diet and wt loss Needs to exercise more   (has silver sneakers0  5   Sleep apnea   Wearing CPAP  6  GI   Abdominal pain   Has colonscopy scheduled in May  7   Charcot marie Tooth   Neuropath  in feet     Current medicines are reviewed at length with the patient today.  The patient does not have concerns regarding medicines.  Signed, Dorris Carnes, MD  10/02/2017 10:53 AM    [address], [place], [place]  [address] Phone: [phone]; Fax: [phone]  ekg cancelled   Done at PCP

## 2017-10-02 NOTE — Patient Instructions (Signed)
Your physician recommends that you schedule a follow-up appointment in: to be determined after tests     Your physician has requested that you have a lexiscan myoview. For further information please visit [email or web address]. Please follow instruction sheet, as given.    Your physician recommends that you continue on your current medications as directed. Please refer to the Current Medication list given to you today.    If you need a refill on your cardiac medications before your next appointment, please call your pharmacy.     No lab work today     Thank you for [facility] !

## 2017-10-02 NOTE — Telephone Encounter (Signed)
Approved and documented °

## 2017-10-05 ENCOUNTER — Encounter (HOSPITAL_BASED_OUTPATIENT_CLINIC_OR_DEPARTMENT_OTHER)
Admission: RE | Admit: 2017-10-05 | Discharge: 2017-10-05 | Disposition: A | Discharge status: Home / Self Care | Payer: Medicare Other | Source: Ambulatory Visit | Attending: Internal Medicine | Admitting: Internal Medicine

## 2017-10-05 ENCOUNTER — Encounter (HOSPITAL_COMMUNITY)
Admission: RE | Admit: 2017-10-05 | Discharge: 2017-10-05 | Disposition: A | Discharge status: Home / Self Care | Payer: Medicare Other | Source: Ambulatory Visit | Attending: Internal Medicine | Admitting: Internal Medicine

## 2017-10-05 ENCOUNTER — Encounter (HOSPITAL_COMMUNITY): Payer: Self-pay

## 2017-10-05 DIAGNOSIS — R0609 Other forms of dyspnea: Secondary | ICD-10-CM | POA: Diagnosis present

## 2017-10-05 HISTORY — DX: Malignant (primary) neoplasm, unspecified: C80.1

## 2017-10-05 LAB — NM MYOCAR MULTI W/SPECT W/WALL MOTION / EF
CHL CUP RESTING HR STRESS: 60 {beats}/min
LHR: 0.38
LVDIAVOL: 117 mL (ref 62–150)
LVSYSVOL: 36 mL
NUC STRESS TID: 1.08
Peak HR: 93 {beats}/min
SDS: 2
SRS: 0
SSS: 2

## 2017-10-05 MED ORDER — REGADENOSON 0.4 MG/5ML IV SOLN
INTRAVENOUS | Status: AC
  Administered 2017-10-05: 0.4 mg via INTRAVENOUS
  Filled 2017-10-05: qty 5

## 2017-10-05 MED ORDER — TECHNETIUM TC 99M TETROFOSMIN IV KIT
10.0000 | PACK | Freq: Once | INTRAVENOUS | Status: AC | PRN
  Administered 2017-10-05: 9.5 via INTRAVENOUS

## 2017-10-05 MED ORDER — TECHNETIUM TC 99M TETROFOSMIN IV KIT
30.0000 | PACK | Freq: Once | INTRAVENOUS | Status: AC | PRN
  Administered 2017-10-05: 27.9 via INTRAVENOUS

## 2017-10-05 MED ORDER — SODIUM CHLORIDE 0.9% FLUSH
INTRAVENOUS | Status: AC
  Administered 2017-10-05: 10 mL via INTRAVENOUS
  Filled 2017-10-05: qty 10

## 2017-10-12 ENCOUNTER — Telehealth: Payer: Self-pay

## 2017-10-12 NOTE — Telephone Encounter (Signed)
Called pt to reschedule TCS/-/+EGD w/Propofol with RMR (d/t schedule change). Pt requested I call his wife to reschedule the procedure. Called his wife. Procedure rescheduled to 12/31/17 at 11:00am. Called and informed Endo scheduler. New pre-op appt 12/28/17 at 9:00am. Wife aware of new pre-op appt. Letter mailed with new procedure instructions.

## 2017-10-19 ENCOUNTER — Other Ambulatory Visit (HOSPITAL_COMMUNITY): Payer: Medicare Other

## 2017-11-06 ENCOUNTER — Ambulatory Visit: Payer: Medicare Other | Admitting: Gastroenterology

## 2017-11-06 ENCOUNTER — Encounter

## 2017-11-30 ENCOUNTER — Telehealth: Payer: Self-pay | Admitting: Internal Medicine

## 2017-11-30 NOTE — Telephone Encounter (Signed)
Where was he hospitalized? We need all records as soon as possible, so we can review. He may not need to have colonoscopy/EGD postponed, as 7/11 is about 5 weeks out from his recent flare. I will need to review records before giving a final recommendation on if we need to postpone or not.

## 2017-11-30 NOTE — Telephone Encounter (Signed)
PLEASE CALL PATIENT, HAVING A FLAIR UP OF DIVERTICULITIS AND WAS TOLD HE COULDN'T HAVE HIS TCS WITH A FLAIR UP [phone]

## 2017-11-30 NOTE — Telephone Encounter (Signed)
Spoke with pts spouse. Pt was seen 08/2017 for perforated diverticulum and Jerrye Bushy. Pts TCS was r/s due to RMR not being available. PT went out of town last week and had a diverticulitis flare which resulted in a CT scan and hospitalization on June 5- June 7. Pt is currently on two antibiotics which he takes when his diverticulitis flares. Pt is scheduled for his EGD/tcs on 12/31/17 with RMR but they are really concerned due to his excessive bleaching with or without food, family hx of colon cancer and recurrent diverticulitis flares. Pt is afraid something is really going on and his procedure shouldn't be put off. Please advise.

## 2017-11-30 NOTE — Telephone Encounter (Signed)
Noted. Pt was seen in [facility] in [place], [place] [phone]. Dakota Thompson please request records.

## 2017-12-01 NOTE — Telephone Encounter (Signed)
Pt notified of results

## 2017-12-01 NOTE — Telephone Encounter (Signed)
Correction, pt is aware that a medical release was mailed, pt must sign and return.

## 2017-12-01 NOTE — Telephone Encounter (Signed)
I will need a signed release of records to retrieve medical records. I have mailed patient a release to sign and get back to me ASAP.

## 2017-12-04 ENCOUNTER — Telehealth: Payer: Self-pay | Admitting: Internal Medicine

## 2017-12-04 NOTE — Telephone Encounter (Signed)
Patient wife called stating they have never received a release and I sent them one from this office

## 2017-12-04 NOTE — Telephone Encounter (Signed)
Noted  

## 2017-12-09 ENCOUNTER — Other Ambulatory Visit (HOSPITAL_COMMUNITY): Payer: Self-pay | Admitting: Internal Medicine

## 2017-12-09 DIAGNOSIS — K5732 Diverticulitis of large intestine without perforation or abscess without bleeding: Secondary | ICD-10-CM

## 2017-12-17 NOTE — Telephone Encounter (Addendum)
Called PCP and was advised they do not have any d/c summaries on patient.   Called spouse to offer appointment tomorrow but they are out of town currently. She states they are scheduled for CT on 12/25/17 ordered by Dr. Gerarda Fraction.

## 2017-12-17 NOTE — Telephone Encounter (Signed)
It looks like patient has an upcoming CT in July? He really needs an office visit and we need records prior to pursuing this colonoscopy July 11, especially with the change in status. Does his PCP have the records from the hospitalization? He may ultimately just need a surgical referral.

## 2017-12-21 NOTE — Patient Instructions (Signed)
Dakota Thompson  12/21/2017     @PREFPERIOPPHARMACY @   Your procedure is scheduled on  12/31/2017 .  Report to [facility] at  900   A.M.  Call this number if you have problems the morning of surgery:  [phone]   Remember:  Do not eat or drink after midnight.  You may drink clear liquids until  (follow the instructions given to you) .  Clear liquids allowed are:                    Water, Juice (non-citric and without pulp), Carbonated beverages, Clear Tea, Black Coffee only, Plain Jell-O only, Gatorade and Plain Popsicles only    Take these medicines the morning of surgery with A SIP OF WATER  Celebrex, gabapentin, benicar, protonix.    Do not wear jewelry, make-up or nail polish.  Do not wear lotions, powders, or perfumes, or deodorant.  Do not shave 48 hours prior to surgery.  Men may shave face and neck.  Do not bring valuables to the hospital.  [facility] is not responsible for any belongings or valuables.  Contacts, dentures or bridgework may not be worn into surgery.  Leave your suitcase in the car.  After surgery it may be brought to your room.  For patients admitted to the hospital, discharge time will be determined by your treatment team.  Patients discharged the day of surgery will not be allowed to drive home.   Name and phone number of your driver:   family Special instructions:  Follow the diet and prep instructions given to you by Dr Roseanne Kaufman office. Please read over the following fact sheets that you were given. Anesthesia Post-op Instructions and Care and Recovery After Surgery       Esophagogastroduodenoscopy Esophagogastroduodenoscopy (EGD) is a procedure to examine the lining of the esophagus, stomach, and first part of the small intestine (duodenum). This procedure is done to check for problems such as inflammation, bleeding, ulcers, or growths. During this procedure, a long, flexible, lighted tube with a camera attached (endoscope)  is inserted down the throat. Tell a health care provider about:  Any allergies you have.  All medicines you are taking, including vitamins, herbs, eye drops, creams, and over-the-counter medicines.  Any problems you or family members have had with anesthetic medicines.  Any blood disorders you have.  Any surgeries you have had.  Any medical conditions you have.  Whether you are pregnant or may be pregnant. What are the risks? Generally, this is a safe procedure. However, problems may occur, including:  Infection.  Bleeding.  A tear (perforation) in the esophagus, stomach, or duodenum.  Trouble breathing.  Excessive sweating.  Spasms of the larynx.  A slowed heartbeat.  Low blood pressure.  What happens before the procedure?  Follow instructions from your health care provider about eating or drinking restrictions.  Ask your health care provider about: ? Changing or stopping your regular medicines. This is especially important if you are taking diabetes medicines or blood thinners. ? Taking medicines such as aspirin and ibuprofen. These medicines can thin your blood. Do not take these medicines before your procedure if your health care provider instructs you not to.  Plan to have someone take you home after the procedure.  If you wear dentures, be ready to remove them before the procedure. What happens during the procedure?  To reduce your risk of infection,  your health care team will wash or sanitize their hands.  An IV tube will be put in a vein in your hand or arm. You will get medicines and fluids through this tube.  You will be given one or more of the following: ? A medicine to help you relax (sedative). ? A medicine to numb the area (local anesthetic). This medicine may be sprayed into your throat. It will make you feel more comfortable and keep you from gagging or coughing during the procedure. ? A medicine for pain.  A mouth guard may be placed in your  mouth to protect your teeth and to keep you from biting on the endoscope.  You will be asked to lie on your left side.  The endoscope will be lowered down your throat into your esophagus, stomach, and duodenum.  Air will be put into the endoscope. This will help your health care provider see better.  The lining of your esophagus, stomach, and duodenum will be examined.  Your health care provider may: ? Take a tissue sample so it can be looked at in a lab (biopsy). ? Remove growths. ? Remove objects (foreign bodies) that are stuck. ? Treat any bleeding with medicines or other devices that stop tissue from bleeding. ? Widen (dilate) or stretch narrowed areas of your esophagus and stomach.  The endoscope will be taken out. The procedure may vary among health care providers and hospitals. What happens after the procedure?  Your blood pressure, heart rate, breathing rate, and blood oxygen level will be monitored often until the medicines you were given have worn off.  Do not eat or drink anything until the numbing medicine has worn off and your gag reflex has returned. This information is not intended to replace advice given to you by your health care provider. Make sure you discuss any questions you have with your health care provider. Document Released: 10/10/2004 Document Revised: 11/15/2015 Document Reviewed: 05/03/2015 Elsevier Interactive Patient Education  2018 [employer]. Esophagogastroduodenoscopy, Care After Refer to this sheet in the next few weeks. These instructions provide you with information about caring for yourself after your procedure. Your health care provider may also give you more specific instructions. Your treatment has been planned according to current medical practices, but problems sometimes occur. Call your health care provider if you have any problems or questions after your procedure. What can I expect after the procedure? After the procedure, it is common to  have:  A sore throat.  Nausea.  Bloating.  Dizziness.  Fatigue.  Follow these instructions at home:  Do not eat or drink anything until the numbing medicine (local anesthetic) has worn off and your gag reflex has returned. You will know that the local anesthetic has worn off when you can swallow comfortably.  Do not drive for 24 hours if you received a medicine to help you relax (sedative).  If your health care provider took a tissue sample for testing during the procedure, make sure to get your test results. This is your responsibility. Ask your health care provider or the department performing the test when your results will be ready.  Keep all follow-up visits as told by your health care provider. This is important. Contact a health care provider if:  You cannot stop coughing.  You are not urinating.  You are urinating less than usual. Get help right away if:  You have trouble swallowing.  You cannot eat or drink.  You have throat or chest pain that  gets worse.  You are dizzy or light-headed.  You faint.  You have nausea or vomiting.  You have chills.  You have a fever.  You have severe abdominal pain.  You have black, tarry, or bloody stools. This information is not intended to replace advice given to you by your health care provider. Make sure you discuss any questions you have with your health care provider. Document Released: 05/26/2012 Document Revised: 11/15/2015 Document Reviewed: 05/03/2015 Elsevier Interactive Patient Education  2018 [employer].  Colonoscopy, Adult A colonoscopy is an exam to look at the large intestine. It is done to check for problems, such as:  Lumps (tumors).  Growths (polyps).  Swelling (inflammation).  Bleeding.  What happens before the procedure? Eating and drinking Follow instructions from your doctor about eating and drinking. These instructions may include:  A few days before the procedure - follow a  low-fiber diet. ? Avoid nuts. ? Avoid seeds. ? Avoid dried fruit. ? Avoid raw fruits. ? Avoid vegetables.  1-3 days before the procedure - follow a clear liquid diet. Avoid liquids that have red or purple dye. Drink only clear liquids, such as: ? Clear broth or bouillon. ? Black coffee or tea. ? Clear juice. ? Clear soft drinks or sports drinks. ? Gelatin dessert. ? Popsicles.  On the day of the procedure - do not eat or drink anything during the 2 hours before the procedure.  Bowel prep If you were prescribed an oral bowel prep:  Take it as told by your doctor. Starting the day before your procedure, you will need to drink a lot of liquid. The liquid will cause you to poop (have bowel movements) until your poop is almost clear or light green.  If your skin or butt gets irritated from diarrhea, you may: ? Wipe the area with wipes that have medicine in them, such as adult wet wipes with aloe and vitamin E. ? Put something on your skin that soothes the area, such as petroleum jelly.  If you throw up (vomit) while drinking the bowel prep, take a break for up to 60 minutes. Then begin the bowel prep again. If you keep throwing up and you cannot take the bowel prep without throwing up, call your doctor.  General instructions  Ask your doctor about changing or stopping your normal medicines. This is important if you take diabetes medicines or blood thinners.  Plan to have someone take you home from the hospital or clinic. What happens during the procedure?  An IV tube may be put into one of your veins.  You will be given medicine to help you relax (sedative).  To reduce your risk of infection: ? Your doctors will wash their hands. ? Your anal area will be washed with soap.  You will be asked to lie on your side with your knees bent.  Your doctor will get a long, thin, flexible tube ready. The tube will have a camera and a light on the end.  The tube will be put into your  anus.  The tube will be gently put into your large intestine.  Air will be delivered into your large intestine to keep it open. You may feel some pressure or cramping.  The camera will be used to take photos.  A small tissue sample may be removed from your body to be looked at under a microscope (biopsy). If any possible problems are found, the tissue will be sent to a lab for testing.  If  small growths are found, your doctor may remove them and have them checked for cancer.  The tube that was put into your anus will be slowly removed. The procedure may vary among doctors and hospitals. What happens after the procedure?  Your doctor will check on you often until the medicines you were given have worn off.  Do not drive for 24 hours after the procedure.  You may have a small amount of blood in your poop.  You may pass gas.  You may have mild cramps or bloating in your belly (abdomen).  It is up to you to get the results of your procedure. Ask your doctor, or the department performing the procedure, when your results will be ready. This information is not intended to replace advice given to you by your health care provider. Make sure you discuss any questions you have with your health care provider. Document Released: 07/12/2010 Document Revised: 04/09/2016 Document Reviewed: 08/21/2015 Elsevier Interactive Patient Education  2017 [employer]  Colonoscopy, Adult, Care After This sheet gives you information about how to care for yourself after your procedure. Your health care provider may also give you more specific instructions. If you have problems or questions, contact your health care provider. What can I expect after the procedure? After the procedure, it is common to have:  A small amount of blood in your stool for 24 hours after the procedure.  Some gas.  Mild abdominal cramping or bloating.  Follow these instructions at home: General instructions   For the first  24 hours after the procedure: ? Do not drive or use machinery. ? Do not sign important documents. ? Do not drink alcohol. ? Do your regular daily activities at a slower pace than normal. ? Eat soft, easy-to-digest foods. ? Rest often.  Take over-the-counter or prescription medicines only as told by your health care provider.  It is up to you to get the results of your procedure. Ask your health care provider, or the department performing the procedure, when your results will be ready. Relieving cramping and bloating  Try walking around when you have cramps or feel bloated.  Apply heat to your abdomen as told by your health care provider. Use a heat source that your health care provider recommends, such as a moist heat pack or a heating pad. ? Place a towel between your skin and the heat source. ? Leave the heat on for 20-30 minutes. ? Remove the heat if your skin turns bright red. This is especially important if you are unable to feel pain, heat, or cold. You may have a greater risk of getting burned. Eating and drinking  Drink enough fluid to keep your urine clear or pale yellow.  Resume your normal diet as instructed by your health care provider. Avoid heavy or fried foods that are hard to digest.  Avoid drinking alcohol for as long as instructed by your health care provider. Contact a health care provider if:  You have blood in your stool 2-3 days after the procedure. Get help right away if:  You have more than a small spotting of blood in your stool.  You pass large blood clots in your stool.  Your abdomen is swollen.  You have nausea or vomiting.  You have a fever.  You have increasing abdominal pain that is not relieved with medicine. This information is not intended to replace advice given to you by your health care provider. Make sure you discuss any questions you have  with your health care provider. Document Released: 01/22/2004 Document Revised: 03/03/2016  Document Reviewed: 08/21/2015 Elsevier Interactive Patient Education  2018 [place] Anesthesia is a term that refers to techniques, procedures, and medicines that help a person stay safe and comfortable during a medical procedure. Monitored anesthesia care, or sedation, is one type of anesthesia. Your anesthesia specialist may recommend sedation if you will be having a procedure that does not require you to be unconscious, such as:  Cataract surgery.  A dental procedure.  A biopsy.  A colonoscopy.  During the procedure, you may receive a medicine to help you relax (sedative). There are three levels of sedation:  Mild sedation. At this level, you may feel awake and relaxed. You will be able to follow directions.  Moderate sedation. At this level, you will be sleepy. You may not remember the procedure.  Deep sedation. At this level, you will be asleep. You will not remember the procedure.  The more medicine you are given, the deeper your level of sedation will be. Depending on how you respond to the procedure, the anesthesia specialist may change your level of sedation or the type of anesthesia to fit your needs. An anesthesia specialist will monitor you closely during the procedure. Let your health care provider know about:  Any allergies you have.  All medicines you are taking, including vitamins, herbs, eye drops, creams, and over-the-counter medicines.  Any use of steroids (by mouth or as a cream).  Any problems you or family members have had with sedatives and anesthetic medicines.  Any blood disorders you have.  Any surgeries you have had.  Any medical conditions you have, such as sleep apnea.  Whether you are pregnant or may be pregnant.  Any use of cigarettes, alcohol, or street drugs. What are the risks? Generally, this is a safe procedure. However, problems may occur, including:  Getting too much medicine  (oversedation).  Nausea.  Allergic reaction to medicines.  Trouble breathing. If this happens, a breathing tube may be used to help with breathing. It will be removed when you are awake and breathing on your own.  Heart trouble.  Lung trouble.  Before the procedure Staying hydrated Follow instructions from your health care provider about hydration, which may include:  Up to 2 hours before the procedure - you may continue to drink clear liquids, such as water, clear fruit juice, black coffee, and plain tea.  Eating and drinking restrictions Follow instructions from your health care provider about eating and drinking, which may include:  8 hours before the procedure - stop eating heavy meals or foods such as meat, fried foods, or fatty foods.  6 hours before the procedure - stop eating light meals or foods, such as toast or cereal.  6 hours before the procedure - stop drinking milk or drinks that contain milk.  2 hours before the procedure - stop drinking clear liquids.  Medicines Ask your health care provider about:  Changing or stopping your regular medicines. This is especially important if you are taking diabetes medicines or blood thinners.  Taking medicines such as aspirin and ibuprofen. These medicines can thin your blood. Do not take these medicines before your procedure if your health care provider instructs you not to.  Tests and exams  You will have a physical exam.  You may have blood tests done to show: ? How well your kidneys and liver are working. ? How well your blood can clot.  General  instructions  Plan to have someone take you home from the hospital or clinic.  If you will be going home right after the procedure, plan to have someone with you for 24 hours.  What happens during the procedure?  Your blood pressure, heart rate, breathing, level of pain and overall condition will be monitored.  An IV tube will be inserted into one of your  veins.  Your anesthesia specialist will give you medicines as needed to keep you comfortable during the procedure. This may mean changing the level of sedation.  The procedure will be performed. After the procedure  Your blood pressure, heart rate, breathing rate, and blood oxygen level will be monitored until the medicines you were given have worn off.  Do not drive for 24 hours if you received a sedative.  You may: ? Feel sleepy, clumsy, or nauseous. ? Feel forgetful about what happened after the procedure. ? Have a sore throat if you had a breathing tube during the procedure. ? Vomit. This information is not intended to replace advice given to you by your health care provider. Make sure you discuss any questions you have with your health care provider. Document Released: 03/05/2005 Document Revised: 11/16/2015 Document Reviewed: 09/30/2015 Elsevier Interactive Patient Education  2018 [place], Care After These instructions provide you with information about caring for yourself after your procedure. Your health care provider may also give you more specific instructions. Your treatment has been planned according to current medical practices, but problems sometimes occur. Call your health care provider if you have any problems or questions after your procedure. What can I expect after the procedure? After your procedure, it is common to:  Feel sleepy for several hours.  Feel clumsy and have poor balance for several hours.  Feel forgetful about what happened after the procedure.  Have poor judgment for several hours.  Feel nauseous or vomit.  Have a sore throat if you had a breathing tube during the procedure.  Follow these instructions at home: For at least 24 hours after the procedure:   Do not: ? Participate in activities in which you could fall or become injured. ? Drive. ? Use heavy machinery. ? Drink alcohol. ? Take sleeping pills or  medicines that cause drowsiness. ? Make important decisions or sign legal documents. ? Take care of children on your own.  Rest. Eating and drinking  Follow the diet that is recommended by your health care provider.  If you vomit, drink water, juice, or soup when you can drink without vomiting.  Make sure you have little or no nausea before eating solid foods. General instructions  Have a responsible adult stay with you until you are awake and alert.  Take over-the-counter and prescription medicines only as told by your health care provider.  If you smoke, do not smoke without supervision.  Keep all follow-up visits as told by your health care provider. This is important. Contact a health care provider if:  You keep feeling nauseous or you keep vomiting.  You feel light-headed.  You develop a rash.  You have a fever. Get help right away if:  You have trouble breathing. This information is not intended to replace advice given to you by your health care provider. Make sure you discuss any questions you have with your health care provider. Document Released: 09/30/2015 Document Revised: 01/30/2016 Document Reviewed: 09/30/2015 Elsevier Interactive Patient Education  [employer].

## 2017-12-22 NOTE — Telephone Encounter (Signed)
Patient has been mailed a release of records (twice) and is aware that we need to get these records ASAP. I still do not have a release.

## 2017-12-25 ENCOUNTER — Encounter (HOSPITAL_COMMUNITY): Payer: Self-pay

## 2017-12-25 ENCOUNTER — Encounter (HOSPITAL_COMMUNITY)
Admission: RE | Admit: 2017-12-25 | Discharge: 2017-12-25 | Disposition: A | Discharge status: Home / Self Care | Payer: Medicare Other | Source: Ambulatory Visit | Attending: Internal Medicine | Admitting: Internal Medicine

## 2017-12-25 ENCOUNTER — Ambulatory Visit (HOSPITAL_COMMUNITY)
Admission: RE | Admit: 2017-12-25 | Discharge: 2017-12-25 | Disposition: A | Discharge status: Home / Self Care | Payer: Medicare Other | Source: Ambulatory Visit | Attending: Internal Medicine | Admitting: Internal Medicine

## 2017-12-25 ENCOUNTER — Other Ambulatory Visit (HOSPITAL_COMMUNITY): Payer: Medicare Other

## 2017-12-25 DIAGNOSIS — K5730 Diverticulosis of large intestine without perforation or abscess without bleeding: Secondary | ICD-10-CM | POA: Diagnosis not present

## 2017-12-25 DIAGNOSIS — K5732 Diverticulitis of large intestine without perforation or abscess without bleeding: Secondary | ICD-10-CM | POA: Insufficient documentation

## 2017-12-25 HISTORY — DX: Anemia, unspecified: D64.9

## 2017-12-25 LAB — POCT I-STAT, CHEM 8
BUN: 21 mg/dL (ref 8–23)
CHLORIDE: 102 mmol/L (ref 98–111)
Calcium, Ion: 1.18 mmol/L (ref 1.15–1.40)
Creatinine, Ser: 1.2 mg/dL (ref 0.61–1.24)
Glucose, Bld: 106 mg/dL — ABNORMAL HIGH (ref 70–99)
HCT: 45 % (ref 39.0–52.0)
HEMOGLOBIN: 15.3 g/dL (ref 13.0–17.0)
POTASSIUM: 4.6 mmol/L (ref 3.5–5.1)
Sodium: 140 mmol/L (ref 135–145)
TCO2: 25 mmol/L (ref 22–32)

## 2017-12-25 LAB — CBC WITH DIFFERENTIAL/PLATELET
BASOS ABS: 0 10*3/uL (ref 0.0–0.1)
Basophils Relative: 1 %
Eosinophils Absolute: 0.1 10*3/uL (ref 0.0–0.7)
Eosinophils Relative: 2 %
HEMATOCRIT: 45.3 % (ref 39.0–52.0)
Hemoglobin: 15.3 g/dL (ref 13.0–17.0)
LYMPHS ABS: 1.2 10*3/uL (ref 0.7–4.0)
LYMPHS PCT: 28 %
MCH: 29.9 pg (ref 26.0–34.0)
MCHC: 33.8 g/dL (ref 30.0–36.0)
MCV: 88.6 fL (ref 78.0–100.0)
Monocytes Absolute: 0.4 10*3/uL (ref 0.1–1.0)
Monocytes Relative: 10 %
NEUTROS PCT: 59 %
Neutro Abs: 2.5 10*3/uL (ref 1.7–7.7)
PLATELETS: 153 10*3/uL (ref 150–400)
RBC: 5.11 MIL/uL (ref 4.22–5.81)
RDW: 13.6 % (ref 11.5–15.5)
WBC: 4.2 10*3/uL (ref 4.0–10.5)

## 2017-12-25 LAB — BASIC METABOLIC PANEL
ANION GAP: 10 (ref 5–15)
BUN: 20 mg/dL (ref 8–23)
CALCIUM: 8.9 mg/dL (ref 8.9–10.3)
CHLORIDE: 107 mmol/L (ref 98–111)
CO2: 23 mmol/L (ref 22–32)
CREATININE: 1.2 mg/dL (ref 0.61–1.24)
GFR calc Af Amer: >60 mL/min (ref >60)
GFR calc non Af Amer: 60 mL/min — ABNORMAL LOW (ref >60)
Glucose, Bld: 131 mg/dL — ABNORMAL HIGH (ref 70–99)
POTASSIUM: 4.1 mmol/L (ref 3.5–5.1)
Sodium: 140 mmol/L (ref 135–145)

## 2017-12-25 MED ORDER — IOPAMIDOL (ISOVUE-300) INJECTION 61%
100.0000 mL | Freq: Once | INTRAVENOUS | Status: AC | PRN
  Administered 2017-12-25: 100 mL via INTRAVENOUS

## 2017-12-28 ENCOUNTER — Other Ambulatory Visit (HOSPITAL_COMMUNITY): Payer: Medicare Other

## 2017-12-30 NOTE — Telephone Encounter (Signed)
We have been unable to retrieve any records. I spoke with his wife today. He has chronic intermittent low-volume hematochezia, which is unchanged. She reports that he was in [place] and told he had diverticulitis November 25, 2017. We do not have this CT report. However, CT ordered by PCP July 5th shows NO ACUTE OR INFLAMMATORY PROCESS. Patient is at baseline for symptoms. No fever or chills. Wife concerned about burping, which was also going on at last appt.   CT done recently is encouraging. He is over 5 weeks out from presumed possible last flare, with a CT in interim documenting no acute findings. Continue with plans for colonoscopy/EGD.

## 2017-12-31 ENCOUNTER — Other Ambulatory Visit: Payer: Self-pay

## 2017-12-31 ENCOUNTER — Ambulatory Visit (HOSPITAL_COMMUNITY)
Admission: RE | Admit: 2017-12-31 | Discharge: 2017-12-31 | Disposition: A | Discharge status: Home / Self Care | Payer: Medicare Other | Source: Ambulatory Visit | Attending: Internal Medicine | Admitting: Internal Medicine

## 2017-12-31 ENCOUNTER — Telehealth: Payer: Self-pay | Admitting: Internal Medicine

## 2017-12-31 ENCOUNTER — Encounter (HOSPITAL_COMMUNITY)
Admission: RE | Disposition: A | Discharge status: Home / Self Care | Payer: Self-pay | Source: Ambulatory Visit | Attending: Internal Medicine

## 2017-12-31 ENCOUNTER — Encounter (HOSPITAL_COMMUNITY): Payer: Self-pay | Admitting: Anesthesiology

## 2017-12-31 ENCOUNTER — Ambulatory Visit (HOSPITAL_COMMUNITY): Payer: Medicare Other | Admitting: Anesthesiology

## 2017-12-31 DIAGNOSIS — Z7902 Long term (current) use of antithrombotics/antiplatelets: Secondary | ICD-10-CM | POA: Diagnosis not present

## 2017-12-31 DIAGNOSIS — Z85828 Personal history of other malignant neoplasm of skin: Secondary | ICD-10-CM | POA: Insufficient documentation

## 2017-12-31 DIAGNOSIS — G4730 Sleep apnea, unspecified: Secondary | ICD-10-CM | POA: Insufficient documentation

## 2017-12-31 DIAGNOSIS — K643 Fourth degree hemorrhoids: Secondary | ICD-10-CM | POA: Diagnosis not present

## 2017-12-31 DIAGNOSIS — K2950 Unspecified chronic gastritis without bleeding: Secondary | ICD-10-CM | POA: Insufficient documentation

## 2017-12-31 DIAGNOSIS — I10 Essential (primary) hypertension: Secondary | ICD-10-CM | POA: Diagnosis not present

## 2017-12-31 DIAGNOSIS — K3189 Other diseases of stomach and duodenum: Secondary | ICD-10-CM

## 2017-12-31 DIAGNOSIS — Z9989 Dependence on other enabling machines and devices: Secondary | ICD-10-CM | POA: Insufficient documentation

## 2017-12-31 DIAGNOSIS — K649 Unspecified hemorrhoids: Secondary | ICD-10-CM

## 2017-12-31 DIAGNOSIS — Z8719 Personal history of other diseases of the digestive system: Secondary | ICD-10-CM | POA: Diagnosis not present

## 2017-12-31 DIAGNOSIS — Z79899 Other long term (current) drug therapy: Secondary | ICD-10-CM | POA: Diagnosis not present

## 2017-12-31 DIAGNOSIS — Z87891 Personal history of nicotine dependence: Secondary | ICD-10-CM | POA: Diagnosis not present

## 2017-12-31 DIAGNOSIS — K5732 Diverticulitis of large intestine without perforation or abscess without bleeding: Secondary | ICD-10-CM | POA: Insufficient documentation

## 2017-12-31 DIAGNOSIS — Z86010 Personal history of colonic polyps: Secondary | ICD-10-CM | POA: Insufficient documentation

## 2017-12-31 DIAGNOSIS — K644 Residual hemorrhoidal skin tags: Secondary | ICD-10-CM | POA: Insufficient documentation

## 2017-12-31 DIAGNOSIS — R1012 Left upper quadrant pain: Secondary | ICD-10-CM

## 2017-12-31 DIAGNOSIS — Z96651 Presence of right artificial knee joint: Secondary | ICD-10-CM | POA: Diagnosis not present

## 2017-12-31 DIAGNOSIS — K921 Melena: Secondary | ICD-10-CM

## 2017-12-31 DIAGNOSIS — K219 Gastro-esophageal reflux disease without esophagitis: Secondary | ICD-10-CM | POA: Insufficient documentation

## 2017-12-31 DIAGNOSIS — Z7982 Long term (current) use of aspirin: Secondary | ICD-10-CM | POA: Diagnosis not present

## 2017-12-31 DIAGNOSIS — Z800 Family history of malignant neoplasm of digestive organs: Secondary | ICD-10-CM | POA: Diagnosis not present

## 2017-12-31 DIAGNOSIS — K625 Hemorrhage of anus and rectum: Secondary | ICD-10-CM

## 2017-12-31 HISTORY — PX: ESOPHAGOGASTRODUODENOSCOPY (EGD) WITH PROPOFOL: SHX5813

## 2017-12-31 HISTORY — PX: COLONOSCOPY WITH PROPOFOL: SHX5780

## 2017-12-31 HISTORY — PX: BIOPSY: SHX5522

## 2017-12-31 SURGERY — COLONOSCOPY WITH PROPOFOL
Anesthesia: Monitor Anesthesia Care

## 2017-12-31 MED ORDER — LIDOCAINE VISCOUS HCL 2 % MT SOLN
OROMUCOSAL | Status: AC
  Filled 2017-12-31: qty 15

## 2017-12-31 MED ORDER — LACTATED RINGERS IV SOLN: INTRAVENOUS | Status: DC

## 2017-12-31 MED ORDER — MIDAZOLAM HCL 5 MG/5ML IJ SOLN
INTRAMUSCULAR | Status: DC | PRN
  Administered 2017-12-31: 2 mg via INTRAVENOUS

## 2017-12-31 MED ORDER — CHLORHEXIDINE GLUCONATE CLOTH 2 % EX PADS: 6.0000 | MEDICATED_PAD | Freq: Once | CUTANEOUS | Status: DC

## 2017-12-31 MED ORDER — PROPOFOL 500 MG/50ML IV EMUL
INTRAVENOUS | Status: DC | PRN
  Administered 2017-12-31: 100 ug/kg/min via INTRAVENOUS
  Administered 2017-12-31: 150 ug/kg/min via INTRAVENOUS

## 2017-12-31 MED ORDER — PHENYLEPHRINE HCL 10 MG/ML IJ SOLN
INTRAMUSCULAR | Status: DC | PRN
  Administered 2017-12-31 (×2): 50 ug via INTRAVENOUS

## 2017-12-31 MED ORDER — LACTATED RINGERS IV SOLN
INTRAVENOUS | Status: DC
  Administered 2017-12-31: 11:00:00 via INTRAVENOUS

## 2017-12-31 MED ORDER — PROMETHAZINE HCL 25 MG/ML IJ SOLN: 6.2500 mg | INTRAMUSCULAR | Status: DC | PRN

## 2017-12-31 MED ORDER — PROPOFOL 10 MG/ML IV BOLUS
INTRAVENOUS | Status: DC | PRN
  Administered 2017-12-31: 10 mg via INTRAVENOUS
  Administered 2017-12-31: 20 mg via INTRAVENOUS

## 2017-12-31 MED ORDER — LIDOCAINE VISCOUS HCL 2 % MT SOLN
6.0000 mL | Freq: Once | OROMUCOSAL | Status: AC
  Administered 2017-12-31: 6 mL via OROMUCOSAL

## 2017-12-31 MED ORDER — HYDROMORPHONE HCL 1 MG/ML IJ SOLN: 0.2500 mg | INTRAMUSCULAR | Status: DC | PRN

## 2017-12-31 MED ORDER — MEPERIDINE HCL 100 MG/ML IJ SOLN: 6.2500 mg | INTRAMUSCULAR | Status: DC | PRN

## 2017-12-31 MED ORDER — HYDROCODONE-ACETAMINOPHEN 7.5-325 MG PO TABS: 1.0000 | ORAL_TABLET | Freq: Once | ORAL | Status: DC | PRN

## 2017-12-31 NOTE — Discharge Instructions (Signed)
°Colonoscopy °Discharge Instructions ° °Read the instructions outlined below and refer to this sheet in the next few weeks. These discharge instructions provide you with general information on caring for yourself after you leave the hospital. Your doctor may also give you specific instructions. While your treatment has been planned according to the most current medical practices available, unavoidable complications occasionally occur. If you have any problems or questions after discharge, call Dr. Rourk at [phone]. °ACTIVITY °· You may resume your regular activity, but move at a slower pace for the next 24 hours.  °· Take frequent rest periods for the next 24 hours.  °· Walking will help get rid of the air and reduce the bloated feeling in your belly (abdomen).  °· No driving for 24 hours (because of the medicine (anesthesia) used during the test).   °· Do not sign any important legal documents or operate any machinery for 24 hours (because of the anesthesia used during the test).  °NUTRITION °· Drink plenty of fluids.  °· You may resume your normal diet as instructed by your doctor.  °· Begin with a light meal and progress to your normal diet. Heavy or fried foods are harder to digest and may make you feel sick to your stomach (nauseated).  °· Avoid alcoholic beverages for 24 hours or as instructed.  °MEDICATIONS °· You may resume your normal medications unless your doctor tells you otherwise.  °WHAT YOU CAN EXPECT TODAY °· Some feelings of bloating in the abdomen.  °· Passage of more gas than usual.  °· Spotting of blood in your stool or on the toilet paper.  °IF YOU HAD POLYPS REMOVED DURING THE COLONOSCOPY: °· No aspirin products for 7 days or as instructed.  °· No alcohol for 7 days or as instructed.  °· Eat a soft diet for the next 24 hours.  °FINDING OUT THE RESULTS OF YOUR TEST °Not all test results are available during your visit. If your test results are not back during the visit, make an appointment  with your caregiver to find out the results. Do not assume everything is normal if you have not heard from your caregiver or the medical facility. It is important for you to follow up on all of your test results.  °SEEK IMMEDIATE MEDICAL ATTENTION IF: °· You have more than a spotting of blood in your stool.  °· Your belly is swollen (abdominal distention).  °· You are nauseated or vomiting.  °· You have a temperature over 101.  °· You have abdominal pain or discomfort that is severe or gets worse throughout the day.  °EGD °Discharge instructions °Please read the instructions outlined below and refer to this sheet in the next few weeks. These discharge instructions provide you with general information on caring for yourself after you leave the hospital. Your doctor may also give you specific instructions. While your treatment has been planned according to the most current medical practices available, unavoidable complications occasionally occur. If you have any problems or questions after discharge, please call your doctor. °ACTIVITY °· You may resume your regular activity but move at a slower pace for the next 24 hours.  °· Take frequent rest periods for the next 24 hours.  °· Walking will help expel (get rid of) the air and reduce the bloated feeling in your abdomen.  °· No driving for 24 hours (because of the anesthesia (medicine) used during the test).  °· You may shower.  °· Do not sign any important   legal documents or operate any machinery for 24 hours (because of the anesthesia used during the test).  NUTRITION  Drink plenty of fluids.   You may resume your normal diet.   Begin with a light meal and progress to your normal diet.   Avoid alcoholic beverages for 24 hours or as instructed by your caregiver.  MEDICATIONS  You may resume your normal medications unless your caregiver tells you otherwise.  WHAT YOU CAN EXPECT TODAY  You may experience abdominal discomfort such as a feeling of fullness  or gas pains.  FOLLOW-UP  Your doctor will discuss the results of your test with you.  SEEK IMMEDIATE MEDICAL ATTENTION IF ANY OF THE FOLLOWING OCCUR:  Excessive nausea (feeling sick to your stomach) and/or vomiting.   Severe abdominal pain and distention (swelling).   Trouble swallowing.   Temperature over 101 F (37.8 C).   Rectal bleeding or vomiting of blood.    GERD information provided  Diverticulosis information provided  Begin Benefiber 2 tablespoons in the morning for 3 weeks then increase to 2 tablespoons twice daily thereafter  Stop Protonix for now  Trial of Dexilant 60 mg daily 3 weeks-go by my office for free samples    ****Office notified of need of samples*****  Office visit with [place] in 6 weeks..********.has been scheduled  Further recommendations to follow pending review of pathology report  Hemorrhoid information provided  Your hemorrhoids are too large (grade 4) to band in the office. The best approach is surgical removal. I suggest you make contact with Dr. Arnoldo Morale.   *****Dr.Rourk's office will make referral and follow up for this*****  PER DR. Gala Romney YOU MAY RESUME ALL USUAL MEDICATIONS     Gastroesophageal Reflux Disease, Adult Normally, food travels down the esophagus and stays in the stomach to be digested. If a person has gastroesophageal reflux disease (GERD), food and stomach acid move back up into the esophagus. When this happens, the esophagus becomes sore and swollen (inflamed). Over time, GERD can make small holes (ulcers) in the lining of the esophagus. Follow these instructions at home: Diet  Follow a diet as told by your doctor. You may need to avoid foods and drinks such as: ? Coffee and tea (with or without caffeine). ? Drinks that contain alcohol. ? Energy drinks and sports drinks. ? Carbonated drinks or sodas. ? Chocolate and cocoa. ? Peppermint and mint flavorings. ? Garlic and onions. ? Horseradish. ? Spicy and acidic  foods, such as peppers, chili powder, curry powder, vinegar, hot sauces, and BBQ sauce. ? Citrus fruit juices and citrus fruits, such as oranges, lemons, and limes. ? Tomato-based foods, such as red sauce, chili, salsa, and pizza with red sauce. ? Fried and fatty foods, such as donuts, french fries, potato chips, and high-fat dressings. ? High-fat meats, such as hot dogs, rib eye steak, sausage, ham, and bacon. ? High-fat dairy items, such as whole milk, butter, and cream cheese.  Eat small meals often. Avoid eating large meals.  Avoid drinking large amounts of liquid with your meals.  Avoid eating meals during the 2-3 hours before bedtime.  Avoid lying down right after you eat.  Do not exercise right after you eat. General instructions  Pay attention to any changes in your symptoms.  Take over-the-counter and prescription medicines only as told by your doctor. Do not take aspirin, ibuprofen, or other NSAIDs unless your doctor says it is okay.  Do not use any tobacco products, including cigarettes, chewing tobacco, and  e-cigarettes. If you need help quitting, ask your doctor.  Wear loose clothes. Do not wear anything tight around your waist.  Raise (elevate) the head of your bed about 6 inches (15 cm).  Try to lower your stress. If you need help doing this, ask your doctor.  If you are overweight, lose an amount of weight that is healthy for you. Ask your doctor about a safe weight loss goal.  Keep all follow-up visits as told by your doctor. This is important. Contact a doctor if:  You have new symptoms.  You lose weight and you do not know why it is happening.  You have trouble swallowing, or it hurts to swallow.  You have wheezing or a cough that keeps happening.  Your symptoms do not get better with treatment.  You have a hoarse voice. Get help right away if:  You have pain in your arms, neck, jaw, teeth, or back.  You feel sweaty, dizzy, or light-headed.  You  have chest pain or shortness of breath.  You throw up (vomit) and your throw up looks like blood or coffee grounds.  You pass out (faint).  Your poop (stool) is bloody or black.  You cannot swallow, drink, or eat. This information is not intended to replace advice given to you by your health care provider. Make sure you discuss any questions you have with your health care provider. Document Released: 11/26/2007 Document Revised: 11/15/2015 Document Reviewed: 10/04/2014 Elsevier Interactive Patient Education  [employer].     Diverticulosis Diverticulosis is a condition that develops when small pouches (diverticula) form in the wall of the large intestine (colon). The colon is where water is absorbed and stool is formed. The pouches form when the inside layer of the colon pushes through weak spots in the outer layers of the colon. You may have a few pouches or many of them. What are the causes? The cause of this condition is not known. What increases the risk? The following factors may make you more likely to develop this condition:  Being older than age 74. Your risk for this condition increases with age. Diverticulosis is rare among people younger than age 47. By age 80, many people have it.  Eating a low-fiber diet.  Having frequent constipation.  Being overweight.  Not getting enough exercise.  Smoking.  Taking over-the-counter pain medicines, like aspirin and ibuprofen.  Having a family history of diverticulosis.  What are the signs or symptoms? In most people, there are no symptoms of this condition. If you do have symptoms, they may include:  Bloating.  Cramps in the abdomen.  Constipation or diarrhea.  Pain in the lower left side of the abdomen.  How is this diagnosed? This condition is most often diagnosed during an exam for other colon problems. Because diverticulosis usually has no symptoms, it often cannot be diagnosed independently. This condition  may be diagnosed by:  Using a flexible scope to examine the colon (colonoscopy).  Taking an X-ray of the colon after dye has been put into the colon (barium enema).  Doing a CT scan.  How is this treated? You may not need treatment for this condition if you have never developed an infection related to diverticulosis. If you have had an infection before, treatment may include:  Eating a high-fiber diet. This may include eating more fruits, vegetables, and grains.  Taking a fiber supplement.  Taking a live bacteria supplement (probiotic).  Taking medicine to relax your colon.  Taking  antibiotic medicines.  Follow these instructions at home:  Drink 6-8 glasses of water or more each day to prevent constipation.  Try not to strain when you have a bowel movement.  If you have had an infection before: ? Eat more fiber as directed by your health care provider or your diet and nutrition specialist (dietitian). ? Take a fiber supplement or probiotic, if your health care provider approves.  Take over-the-counter and prescription medicines only as told by your health care provider.  If you were prescribed an antibiotic, take it as told by your health care provider. Do not stop taking the antibiotic even if you start to feel better.  Keep all follow-up visits as told by your health care provider. This is important. Contact a health care provider if:  You have pain in your abdomen.  You have bloating.  You have cramps.  You have not had a bowel movement in 3 days. Get help right away if:  Your pain gets worse.  Your bloating becomes very bad.  You have a fever or chills, and your symptoms suddenly get worse.  You vomit.  You have bowel movements that are bloody or black.  You have bleeding from your rectum. Summary  Diverticulosis is a condition that develops when small pouches (diverticula) form in the wall of the large intestine (colon).  You may have a few pouches  or many of them.  This condition is most often diagnosed during an exam for other colon problems.  If you have had an infection related to diverticulosis, treatment may include increasing the fiber in your diet, taking supplements, or taking medicines. This information is not intended to replace advice given to you by your health care provider. Make sure you discuss any questions you have with your health care provider. Document Released: 03/06/2004 Document Revised: 04/28/2016 Document Reviewed: 04/28/2016 Elsevier Interactive Patient Education  2017 [place].     Hemorrhoids Hemorrhoids are swollen veins in and around the rectum or anus. There are two types of hemorrhoids:  Internal hemorrhoids. These occur in the veins that are just inside the rectum. They may poke through to the outside and become irritated and painful.  External hemorrhoids. These occur in the veins that are outside of the anus and can be felt as a painful swelling or hard lump near the anus.  Most hemorrhoids do not cause serious problems, and they can be managed with home treatments such as diet and lifestyle changes. If home treatments do not help your symptoms, procedures can be done to shrink or remove the hemorrhoids. What are the causes? This condition is caused by increased pressure in the anal area. This pressure may result from various things, including:  Constipation.  Straining to have a bowel movement.  Diarrhea.  Pregnancy.  Obesity.  Sitting for long periods of time.  Heavy lifting or other activity that causes you to strain.  Anal sex.  What are the signs or symptoms? Symptoms of this condition include:  Pain.  Anal itching or irritation.  Rectal bleeding.  Leakage of stool (feces).  Anal swelling.  One or more lumps around the anus.  How is this diagnosed? This condition can often be diagnosed through a visual exam. Other exams or tests may also be done, such  as:  Examination of the rectal area with a gloved hand (digital rectal exam).  Examination of the anal canal using a small tube (anoscope).  A blood test, if you have lost a significant  amount of blood.  A test to look inside the colon (sigmoidoscopy or colonoscopy).  How is this treated? This condition can usually be treated at home. However, various procedures may be done if dietary changes, lifestyle changes, and other home treatments do not help your symptoms. These procedures can help make the hemorrhoids smaller or remove them completely. Some of these procedures involve surgery, and others do not. Common procedures include:  Rubber band ligation. Rubber bands are placed at the base of the hemorrhoids to cut off the blood supply to them.  Sclerotherapy. Medicine is injected into the hemorrhoids to shrink them.  Infrared coagulation. A type of light energy is used to get rid of the hemorrhoids.  Hemorrhoidectomy surgery. The hemorrhoids are surgically removed, and the veins that supply them are tied off.  Stapled hemorrhoidopexy surgery. A circular stapling device is used to remove the hemorrhoids and use staples to cut off the blood supply to them.  Follow these instructions at home: Eating and drinking  Eat foods that have a lot of fiber in them, such as whole grains, beans, nuts, fruits, and vegetables. Ask your health care provider about taking products that have added fiber (fiber supplements).  Drink enough fluid to keep your urine clear or pale yellow. Managing pain and swelling  Take warm sitz baths for 20 minutes, 3-4 times a day to ease pain and discomfort.  If directed, apply ice to the affected area. Using ice packs between sitz baths may be helpful. ? Put ice in a plastic bag. ? Place a towel between your skin and the bag. ? Leave the ice on for 20 minutes, 2-3 times a day. General instructions  Take over-the-counter and prescription medicines only as told by  your health care provider.  Use medicated creams or suppositories as told.  Exercise regularly.  Go to the bathroom when you have the urge to have a bowel movement. Do not wait.  Avoid straining to have bowel movements.  Keep the anal area dry and clean. Use wet toilet paper or moist towelettes after a bowel movement.  Do not sit on the toilet for long periods of time. This increases blood pooling and pain. Contact a health care provider if:  You have increasing pain and swelling that are not controlled by treatment or medicine.  You have uncontrolled bleeding.  You have difficulty having a bowel movement, or you are unable to have a bowel movement.  You have pain or inflammation outside the area of the hemorrhoids. This information is not intended to replace advice given to you by your health care provider. Make sure you discuss any questions you have with your health care provider. Document Released: 06/06/2000 Document Revised: 11/07/2015 Document Reviewed: 02/21/2015 Elsevier Interactive Patient Education  2018 [place], Care After These instructions provide you with information about caring for yourself after your procedure. Your health care provider may also give you more specific instructions. Your treatment has been planned according to current medical practices, but problems sometimes occur. Call your health care provider if you have any problems or questions after your procedure. What can I expect after the procedure? After your procedure, it is common to:  Feel sleepy for several hours.  Feel clumsy and have poor balance for several hours.  Feel forgetful about what happened after the procedure.  Have poor judgment for several hours.  Feel nauseous or vomit.  Have a sore throat if you had a breathing tube  during the procedure.  Follow these instructions at home: For at least 24 hours after the procedure:   Do  not: ? Participate in activities in which you could fall or become injured. ? Drive. ? Use heavy machinery. ? Drink alcohol. ? Take sleeping pills or medicines that cause drowsiness. ? Make important decisions or sign legal documents. ? Take care of children on your own.  Rest. Eating and drinking  Follow the diet that is recommended by your health care provider.  If you vomit, drink water, juice, or soup when you can drink without vomiting.  Make sure you have little or no nausea before eating solid foods. General instructions  Have a responsible adult stay with you until you are awake and alert.  Take over-the-counter and prescription medicines only as told by your health care provider.  If you smoke, do not smoke without supervision.  Keep all follow-up visits as told by your health care provider. This is important. Contact a health care provider if:  You keep feeling nauseous or you keep vomiting.  You feel light-headed.  You develop a rash.  You have a fever. Get help right away if:  You have trouble breathing. This information is not intended to replace advice given to you by your health care provider. Make sure you discuss any questions you have with your health care provider. Document Released: 09/30/2015 Document Revised: 01/30/2016 Document Reviewed: 09/30/2015 Elsevier Interactive Patient Education  [employer].

## 2017-12-31 NOTE — H&P (Signed)
@LOGO @   Primary Care Physician:  Redmond School, MD Primary Gastroenterologist:  Dr. Gala Romney  Pre-Procedure History & Physical: HPI:  Dakota Thompson is a 72 y.o. male here for first diagnostic colonoscopy. Recent rectal bleeding. History of advanced adenoma and a positive family history of 2 first-degree relatives with colon cancer. History recently significant for perforated diverticulitis managed conservatively. Recent CT confirms acute diverticulitis previously has resolved. Patient is frequent reflux symptoms but no dysphagia. Patient with persisting left upper quadrant abdominal pain.  Past Medical History:  Diagnosis Date  . Anemia   . Anxiety   . Cancer (HCC)    Skin  . 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 GERD (gastroesophageal reflux disease)   . Hx of adenomatous colonic polyps    tubulovillous adenoma  . Hypertension   . Pancytopenia    mild  . Pancytopenia 02/27/2011  . Restless leg syndrome   . Sleep apnea    wears CPAP/BIPAP every night  . Thyroid disease   . Viral syndrome    Hx  . Viral syndrome 02/27/2011    Past Surgical History:  Procedure Laterality Date  . COLONOSCOPY  01/02/2010   RMR: normal rectum and colon. marginal prep compromised exam.   . COLONOSCOPY N/A 03/15/2015   Dr. Gala Romney: tubular adenoma, colonic diverticulosis, surveillance due 2021  . ESOPHAGOGASTRODUODENOSCOPY N/A 03/15/2015   2 cm hiatal hernia, otherwise normal  . EYE SURGERY     bilateral cataracts  . GANGLION CYST EXCISION Right   . knee cartilage repair Bilateral 01/31/2011  . NASAL SINUS SURGERY    . removal skin cancer      nose  . TOTAL KNEE ARTHROPLASTY Right 10/03/2016   Procedure: RIGHT TOTAL KNEE ARTHROPLASTY;  Surgeon: Sydnee Cabal, MD;  Location: [facility];  Service: Orthopedics;  Laterality: Right;    Prior to Admission medications   Medication Sig Start Date End Date Taking? Authorizing Provider  celecoxib  (CELEBREX) 100 MG capsule Take 100 mg by mouth 2 (two) times daily.  10/17/16  Yes [provider]  Coenzyme Q10 200 MG capsule Take 200 mg by mouth every evening.   Yes [provider]  docusate sodium (COLACE) 100 MG capsule Take 200 mg by mouth daily.   Yes [provider]  gabapentin (NEURONTIN) 300 MG capsule Take 600 mg by mouth every evening.    Yes [provider]  Multiple Vitamin (MULTIVITAMIN WITH MINERALS) TABS tablet Take 1 tablet by mouth daily. Centrum Silver   Yes [provider]  olmesartan (BENICAR) 40 MG tablet Take 40 mg by mouth daily. 07/26/15  Yes [provider]  Omega-3 Fatty Acids (FISH OIL) 1200 MG CAPS Take 1,200-2,400 mg by mouth See admin instructions. 2400 mg in the morning & 1200 mg in the evening.    Yes [provider]  pantoprazole (PROTONIX) 40 MG tablet Take 40-80 mg by mouth See admin instructions. Take 80 mg by mouth in the morning and take 40 mg by mouth in the evening at 1830   Yes [provider]  Polyethylene Glycol 400 (BLINK TEARS) 0.25 % SOLN Place 1 drop into both eyes daily as needed (for dry eyes).   Yes [provider]  pravastatin (PRAVACHOL) 20 MG tablet Take 20 mg by mouth every evening.    Yes [provider]  traZODone (DESYREL) 50 MG tablet Take 1 tablet (50 mg total) by mouth at bedtime as needed for sleep.  Patient taking differently: Take 50 mg by mouth every evening.  01/20/17  Yes Kathie Dike, MD  aspirin EC 81 MG tablet Take 81 mg by mouth daily.    [provider]  hydrocortisone (ANUSOL-HC) 2.5 % rectal cream Place 1 application rectally 2 (two) times daily. Patient not taking: Reported on 12/21/2017 09/22/17   Annitta Needs, NP  hydrocortisone (ANUSOL-HC) 25 MG suppository Place 1 suppository (25 mg total) rectally every 12 (twelve) hours. Patient not taking: Reported on 12/21/2017 09/22/17   Annitta Needs, NP    Allergies as of 09/17/2017  .  (No Known Allergies)    Family History  Problem Relation Age of Onset  . Colon cancer Brother        older than 47   . Colon cancer Sister        older than 18     Social History   Socioeconomic History  . Marital status: Married    Spouse name: Adela Lank   . Number of children: 2  . Years of education: Not on file  . Highest education level: Not on file  Occupational History  . Occupation: Retired    Fish farm manager: RETIRED  Social Needs  . Financial resource strain: Not on file  . Food insecurity:    Worry: Not on file    Inability: Not on file  . Transportation needs:    Medical: Not on file    Non-medical: Not on file  Tobacco Use  . Smoking status: Former Smoker    Packs/day: 3.00    Years: 28.00    Pack years: 84.00    Types: Cigarettes    Last attempt to quit: 05/22/1981    Years since quitting: 36.6  . Smokeless tobacco: Never Used  Substance and Sexual Activity  . Alcohol use: Yes    Comment: rare  . Drug use: No  . Sexual activity: Yes  Lifestyle  . Physical activity:    Days per week: Not on file    Minutes per session: Not on file  . Stress: Not on file  Relationships  . Social connections:    Talks on phone: Not on file    Gets together: Not on file    Attends religious service: Not on file    Active member of club or organization: Not on file    Attends meetings of clubs or organizations: Not on file    Relationship status: Not on file  . Intimate partner violence:    Fear of current or ex partner: Not on file    Emotionally abused: Not on file    Physically abused: Not on file    Forced sexual activity: Not on file  Other Topics Concern  . Not on file  Social History Narrative   Patient lives at home with his wife.    Patient has 2 children.    Patient is retired.     Review of Systems: See HPI, otherwise negative ROS  Physical Exam: BP 116/60   Pulse 64   Temp 97.6 F (36.4 C) (Oral)   Resp 20   Ht 5\' 10"  (1.778 m)   Wt 250 lb  (113.4 kg)   SpO2 94%   BMI 35.87 kg/m  General:   Alert,  Well-developed, well-nourished, pleasant and cooperative in NAD Neck:  Supple; no masses or thyromegaly. No significant cervical adenopathy. Lungs:  Clear throughout to auscultation.   No wheezes, crackles, or rhonchi. No acute distress. Heart:  Regular rate and  rhythm; no murmurs, clicks, rubs,  or gallops. Abdomen: Non-distended, normal bowel sounds. Some localized left upper quadrant abdominal tenderness. No appreciable mass or hepatosplenomegaly.s:  Normal pulses noted. Extremities:  Without clubbing or edema.  Impression/Plan:  72 year old gentleman with history advanced adenoma and positive family history colon cancer. Recent rectal bleeding. History of complicated Diverticulitis. Refractory reflux symptoms. No dysphagia.  I have offered the patient an EGD and colonoscopy today.  The risks, benefits, limitations, imponderables and alternatives regarding both EGD and colonoscopy have been reviewed with the patient. Questions have been answered. All parties agreeable.      Notice: This dictation was prepared with Dragon dictation along with smaller phrase technology. Any transcriptional errors that result from this process are unintentional and may not be corrected upon review.

## 2017-12-31 NOTE — Anesthesia Preprocedure Evaluation (Signed)
Anesthesia Evaluation  Patient identified by MRN, date of birth, ID band Patient awake    Reviewed: Allergy & Precautions, H&P , NPO status , Patient's Chart, lab work & pertinent test results, reviewed documented beta blocker date and time   Airway Mallampati: II  TM Distance: >3 FB Neck ROM: full    Dental no notable dental hx. (+) Teeth Intact, Dental Advidsory Given   Pulmonary neg pulmonary ROS, sleep apnea , former smoker,    Pulmonary exam normal breath sounds clear to auscultation       Cardiovascular Exercise Tolerance: Good hypertension, On Medications negative cardio ROS   Rhythm:regular Rate:Normal     Neuro/Psych Anxiety  Neuromuscular disease negative neurological ROS  negative psych ROS   GI/Hepatic negative GI ROS, Neg liver ROS, hiatal hernia, GERD  ,  Endo/Other  negative endocrine ROS  Renal/GU negative Renal ROS  negative genitourinary   Musculoskeletal   Abdominal   Peds  Hematology negative hematology ROS (+) anemia ,   Anesthesia Other Findings   Reproductive/Obstetrics negative OB ROS                             Anesthesia Physical Anesthesia Plan  ASA: III  Anesthesia Plan: MAC   Post-op Pain Management:    Induction:   PONV Risk Score and Plan:   Airway Management Planned:   Additional Equipment:   Intra-op Plan:   Post-operative Plan:   Informed Consent: I have reviewed the patients History and Physical, chart, labs and discussed the procedure including the risks, benefits and alternatives for the proposed anesthesia with the patient or authorized representative who has indicated his/her understanding and acceptance.   Dental Advisory Given  Plan Discussed with: CRNA and Anesthesiologist  Anesthesia Plan Comments:         Anesthesia Quick Evaluation

## 2017-12-31 NOTE — Telephone Encounter (Signed)
Noted, medication is ready for pickup.

## 2017-12-31 NOTE — Op Note (Signed)
[facility] Patient Name: Dakota Thompson Procedure Date: 12/31/2017 10:42 AM MRN: [id number] Date of Birth: 09-May-1946 Attending MD: Norvel Richards , MD CSN: [id number] Age: 72 Admit Type: Outpatient Procedure:                Upper GI endoscopy Indications:              Abdominal pain in the left upper quadrant Providers:                Norvel Richards, MD, Janeece Riggers, RN, Randa Spike, Technician Referring MD:              Medicines:                Propofol per Anesthesia Complications:            No immediate complications. Estimated blood loss:                            Minimal. Estimated Blood Loss:     Estimated blood loss was minimal. Procedure:                Pre-Anesthesia Assessment:                           - Prior to the procedure, a History and Physical                            was performed, and patient medications and                            allergies were reviewed. The patient's tolerance of                            previous anesthesia was also reviewed. The risks                            and benefits of the procedure and the sedation                            options and risks were discussed with the patient.                            All questions were answered, and informed consent                            was obtained. Prior Anticoagulants: The patient has                            taken no previous anticoagulant or antiplatelet                            agents. ASA Grade Assessment: III - A patient with  severe systemic disease. After reviewing the risks                            and benefits, the patient was deemed in                            satisfactory condition to undergo the procedure.                           After obtaining informed consent, the endoscope was                            passed under direct vision. Throughout the                            procedure,  the patient's blood pressure, pulse, and                            oxygen saturations were monitored continuously. The                            [id number]-[id number] ([id number]) scope was introduced through the                            and advanced to the second part of duodenum. The                            upper GI endoscopy was accomplished without                            difficulty. The patient tolerated the procedure                            well. Scope In: 11:07:43 AM Scope Out: 11:13:45 AM Total Procedure Duration: 0 hours 6 minutes 2 seconds  Findings:      The examined esophagus was normal.      Diffuse moderately erythematous mucosa was found in the entire examined       stomach. Estimated blood loss was minimal. This was biopsied with a cold       forceps for histology. Estimated blood loss was minimal.      The second portion of the duodenum and third portion of the duodenum       were normal. Impression:               - Normal esophagus.                           - Erythematous mucosa in the stomach. Biopsied.                           - Normal second portion of the duodenum and third                            portion of the duodenum. Biopsied. Moderate Sedation:      Moderate (conscious)  sedation was administered by the endoscopy nurse       and supervised by the endoscopist. The following parameters were       monitored: oxygen saturation, heart rate, blood pressure, respiratory       rate, EKG, adequacy of pulmonary ventilation, and response to care.       Total physician intraservice time was 11 minutes. Recommendation:           - Patient has a contact number available for                            emergencies. The signs and symptoms of potential                            delayed complications were discussed with the                            patient. Return to normal activities tomorrow.                            Written discharge instructions were provided to the                             patient.                           - Resume previous diet.                           - Continue present medications.                           - Await pathology results.                           - Return to GI clinic (date not yet determined).                            See colonoscopy report. Procedure Code(s):        --- Professional ---                           [phone], Moderate sedation services provided by the                            same physician or other qualified health care                            professional performing the diagnostic or                            therapeutic service that the sedation supports,                            requiring the presence of an independent trained  observer to assist in the monitoring of the                            patient's level of consciousness and physiological                            status; initial 15 minutes of intraservice time,                            patient age 33 years or older Diagnosis Code(s):        --- Professional ---                           K31.89, Other diseases of stomach and duodenum                           R10.12, Left upper quadrant pain CPT copyright 2017 American Medical Association. All rights reserved. The codes documented in this report are preliminary and upon coder review may  be revised to meet current compliance requirements. Cristopher Estimable. Adrick Kestler, MD Norvel Richards, MD 12/31/2017 11:18:58 AM This report has been signed electronically. Number of Addenda: 0

## 2017-12-31 NOTE — Op Note (Signed)
[facility] Patient Name: Dakota Thompson Procedure Date: 12/31/2017 11:16 AM MRN: [id number] Date of Birth: 1945/08/26 Attending MD: Norvel Richards , MD CSN: [id number] Age: 72 Admit Type: Outpatient Procedure:                Colonoscopy Indications:              Hematochezia Providers:                Norvel Richards, MD, Janeece Riggers, RN, Randa Spike, Technician Referring MD:              Medicines:                Propofol per Anesthesia Complications:            No immediate complications. Estimated Blood Loss:     Estimated blood loss: none. Procedure:                Pre-Anesthesia Assessment:                           - Prior to the procedure, a History and Physical                            was performed, and patient medications and                            allergies were reviewed. The patient's tolerance of                            previous anesthesia was also reviewed. The risks                            and benefits of the procedure and the sedation                            options and risks were discussed with the patient.                            All questions were answered, and informed consent                            was obtained. Prior Anticoagulants: The patient has                            taken no previous anticoagulant or antiplatelet                            agents. ASA Grade Assessment: III - A patient with                            severe systemic disease. After reviewing the risks                            and  benefits, the patient was deemed in                            satisfactory condition to undergo the procedure.                           After obtaining informed consent, the colonoscope                            was passed under direct vision. Throughout the                            procedure, the patient's blood pressure, pulse, and                            oxygen saturations were  monitored continuously. The                            [id number]-[id number] ([id number]) scope was introduced through                            the and advanced to the the cecum, identified by                            appendiceal orifice and ileocecal valve. The                            ileocecal valve, appendiceal orifice, and rectum                            were photographed. The ileocecal valve, appendiceal                            orifice, and rectum were photographed. The entire                            colon was well visualized. Scope In: 11:20:56 AM Scope Out: 11:33:27 AM Scope Withdrawal Time: 0 hours 7 minutes 43 seconds  Total Procedure Duration: 0 hours 12 minutes 31 seconds  Findings:      Hemorrhoids were found on perianal exam.      Non-bleeding external hemorrhoids were found during endoscopy. The       hemorrhoids were moderate, large and Grade IV (internal hemorrhoids that       prolapse and cannot be reduced manually).      Multiple small and large-mouthed diverticula were found in the sigmoid       colon and descending colon.      The exam was otherwise without abnormality on direct and retroflexion       views. Impression:               - Hemorrhoids found on perianal exam.                           - Non-bleeding external hemorrhoids.                           -  Diverticulosis in the sigmoid colon and in the                            descending colon.                           - The examination was otherwise normal on direct                            and retroflexion views.                           - No specimens collected. Moderate Sedation:      Moderate (conscious) sedation was personally administered by an       anesthesia professional. The following parameters were monitored: oxygen       saturation, heart rate, blood pressure, respiratory rate, EKG, adequacy       of pulmonary ventilation, and response to care. Total physician       intraservice time was 33  minutes. Recommendation:           - Patient has a contact number available for                            emergencies. The signs and symptoms of potential                            delayed complications were discussed with the                            patient. Return to normal activities tomorrow.                            Written discharge instructions were provided to the                            patient.                           - Resume previous diet.                           - Continue present medications.                           - Repeat colonoscopy in 5 years for surveillance.                           - Return to GI office in 8 weeks. Begin Benefiber                            as instructed. See Dr. Arnoldo Morale for hemorrhoidectomy. Procedure Code(s):        --- Professional ---                           [phone], Colonoscopy, flexible; diagnostic, including  collection of specimen(s) by brushing or washing,                            when performed (separate procedure) Diagnosis Code(s):        --- Professional ---                           F47.3, Fourth degree hemorrhoids                           K64.4, Residual hemorrhoidal skin tags                           K92.1, Melena (includes Hematochezia)                           K57.30, Diverticulosis of large intestine without                            perforation or abscess without bleeding CPT copyright 2017 American Medical Association. All rights reserved. The codes documented in this report are preliminary and upon coder review may  be revised to meet current compliance requirements. Dakota Thompson. Dakota Memon, MD Norvel Richards, MD 12/31/2017 11:42:28 AM This report has been signed electronically. Number of Addenda: 0

## 2017-12-31 NOTE — Telephone Encounter (Signed)
Per RMR recommendations in today's procedure note he recommends patient to follow up with Dr Arnoldo Morale for a hemorrhoidectomy. Is this something we need to send a referral one or can patient call Dr Adline Mango office to schedule?

## 2017-12-31 NOTE — Telephone Encounter (Signed)
RMR is sending patient to the office to pick up Dexilant  60mg  samples x 3 week supply.

## 2017-12-31 NOTE — Anesthesia Postprocedure Evaluation (Signed)
Anesthesia Post Note  Patient: Dakota Thompson  Procedure(s) Performed: COLONOSCOPY WITH PROPOFOL (N/A ) ESOPHAGOGASTRODUODENOSCOPY (EGD) WITH PROPOFOL (N/A ) BIOPSY  Patient location during evaluation: PACU Anesthesia Type: MAC Level of consciousness: awake and alert and oriented Pain management: pain level controlled Vital Signs Assessment: post-procedure vital signs reviewed and stable Respiratory status: spontaneous breathing Cardiovascular status: stable Postop Assessment: no apparent nausea or vomiting Anesthetic complications: no     Last Vitals:  Vitals:   12/31/17 0919 12/31/17 1141  BP: 116/60 (!) 96/59  Pulse: 64 77  Resp: 20 (!) 21  Temp: 36.4 C 36.5 C  SpO2: 94% 96%    Last Pain:  Vitals:   12/31/17 0919  TempSrc: Oral  PainSc: 2                  Io Dieujuste

## 2017-12-31 NOTE — Transfer of Care (Signed)
Immediate Anesthesia Transfer of Care Note  Patient: Dakota Thompson  Procedure(s) Performed: COLONOSCOPY WITH PROPOFOL (N/A ) ESOPHAGOGASTRODUODENOSCOPY (EGD) WITH PROPOFOL (N/A ) BIOPSY  Patient Location: PACU  Anesthesia Type:MAC  Level of Consciousness: awake  Airway & Oxygen Therapy: Patient Spontanous Breathing  Post-op Assessment: Report given to RN  Post vital signs: Reviewed  Last Vitals:  Vitals Value Taken Time  BP 96/59 12/31/2017 11:41 AM  Temp    Pulse 78 12/31/2017 11:42 AM  Resp 21 12/31/2017 11:42 AM  SpO2 96 % 12/31/2017 11:42 AM  Vitals shown include unvalidated device data.  Last Pain:  Vitals:   12/31/17 0919  TempSrc: Oral  PainSc: 2       Patients Stated Pain Goal: 6 (31/12/16 2446)  Complications: No apparent anesthesia complications

## 2017-12-31 NOTE — Telephone Encounter (Signed)
Per RMR d/c see Dr. Arnoldo Morale for hemorrhoidectomy. Referral sent.

## 2018-01-03 ENCOUNTER — Encounter: Payer: Self-pay | Admitting: Internal Medicine

## 2018-01-05 ENCOUNTER — Encounter (HOSPITAL_COMMUNITY): Payer: Self-pay | Admitting: Internal Medicine

## 2018-01-18 ENCOUNTER — Telehealth: Payer: Self-pay | Admitting: Internal Medicine

## 2018-01-18 NOTE — Telephone Encounter (Signed)
Patient received dexilant samples and would like a prescription sent to [facility] on [address] in [place]

## 2018-01-18 NOTE — Telephone Encounter (Signed)
Pt received samples per RMR on 12/31/17. Pt would like RX sent to his pharmacy.

## 2018-01-19 MED ORDER — DEXLANSOPRAZOLE 60 MG PO CPDR: 60.0000 mg | DELAYED_RELEASE_CAPSULE | Freq: Every day | ORAL | 3 refills | Status: DC

## 2018-01-19 NOTE — Addendum Note (Signed)
Addended by: Annitta Needs on: 01/19/2018 01:26 PM   Modules accepted: Orders

## 2018-01-19 NOTE — Telephone Encounter (Signed)
Done

## 2018-01-21 ENCOUNTER — Ambulatory Visit: Payer: Medicare Other | Admitting: General Surgery

## 2018-02-16 ENCOUNTER — Ambulatory Visit: Payer: Medicare Other | Admitting: General Surgery

## 2018-02-16 ENCOUNTER — Encounter: Payer: Self-pay | Admitting: General Surgery

## 2018-02-16 VITALS — BP 130/66 | HR 73 | Temp 98.9°F | Resp 20 | Wt 249.5 lb

## 2018-02-16 DIAGNOSIS — K644 Residual hemorrhoidal skin tags: Secondary | ICD-10-CM | POA: Diagnosis not present

## 2018-02-16 NOTE — Progress Notes (Signed)
Dakota Thompson; [id number]; 03/09/1946   HPI Patient is a 72 year old white male who was referred to my care by Dr. Gala Romney for evaluation treatment of hemorrhoidal disease.  Patient states he has had intermittent episodes of hemorrhoidal disease for some time now.  He was found on a recent colonoscopy to have prolapsing external hemorrhoids.  They could not be banded.  Patient states he has intermittent episodes of blood per rectum and he notices blood on the toilet paper when he wipes himself.  His last episode was several months ago.  He currently has no rectal pain.  He is not trying any creams or suppositories. Past Medical History:  Diagnosis Date  . Anemia   . Anxiety   . Cancer (HCC)    Skin  . 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 GERD (gastroesophageal reflux disease)   . Hx of adenomatous colonic polyps    tubulovillous adenoma  . Hypertension   . Pancytopenia    mild  . Pancytopenia 02/27/2011  . Restless leg syndrome   . Sleep apnea    wears CPAP/BIPAP every night  . Thyroid disease   . Viral syndrome    Hx  . Viral syndrome 02/27/2011    Past Surgical History:  Procedure Laterality Date  . BIOPSY  12/31/2017   Procedure: BIOPSY;  Surgeon: Daneil Dolin, MD;  Location: [facility];  Service: Endoscopy;;  gastric biopsy   . COLONOSCOPY  01/02/2010   RMR: normal rectum and colon. marginal prep compromised exam.   . COLONOSCOPY N/A 03/15/2015   Dr. Gala Romney: tubular adenoma, colonic diverticulosis, surveillance due 2021  . COLONOSCOPY WITH PROPOFOL N/A 12/31/2017   Procedure: COLONOSCOPY WITH PROPOFOL;  Surgeon: Daneil Dolin, MD;  Location: [facility];  Service: Endoscopy;  Laterality: N/A;  2:00pm  . ESOPHAGOGASTRODUODENOSCOPY N/A 03/15/2015   2 cm hiatal hernia, otherwise normal  . ESOPHAGOGASTRODUODENOSCOPY (EGD) WITH PROPOFOL N/A 12/31/2017   Procedure: ESOPHAGOGASTRODUODENOSCOPY (EGD) WITH PROPOFOL;  Surgeon:  Daneil Dolin, MD;  Location: [facility];  Service: Endoscopy;  Laterality: N/A;  . EYE SURGERY     bilateral cataracts  . GANGLION CYST EXCISION Right   . knee cartilage repair Bilateral 01/31/2011  . NASAL SINUS SURGERY    . removal skin cancer      nose  . TOTAL KNEE ARTHROPLASTY Right 10/03/2016   Procedure: RIGHT TOTAL KNEE ARTHROPLASTY;  Surgeon: Sydnee Cabal, MD;  Location: [facility];  Service: Orthopedics;  Laterality: Right;    Family History  Problem Relation Age of Onset  . Colon cancer Brother        older than 57   . Colon cancer Sister        older than 77     Current Outpatient Medications on File Prior to Visit  Medication Sig Dispense Refill  . aspirin EC 81 MG tablet Take 81 mg by mouth daily.    . celecoxib (CELEBREX) 100 MG capsule Take 100 mg by mouth 2 (two) times daily.   3  . Coenzyme Q10 200 MG capsule Take 200 mg by mouth every evening.    Marland Kitchen dexlansoprazole (DEXILANT) 60 MG capsule Take 1 capsule (60 mg total) by mouth daily. 90 capsule 3  . docusate sodium (COLACE) 100 MG capsule Take 200 mg by mouth daily.    Marland Kitchen gabapentin (NEURONTIN) 300 MG capsule Take 600 mg by mouth every evening.     . Multiple Vitamin (  MULTIVITAMIN WITH MINERALS) TABS tablet Take 1 tablet by mouth daily. Centrum Silver    . olmesartan (BENICAR) 40 MG tablet Take 40 mg by mouth daily.  3  . Omega-3 Fatty Acids (FISH OIL) 1200 MG CAPS Take 1,200-2,400 mg by mouth See admin instructions. 2400 mg in the morning & 1200 mg in the evening.     . Polyethylene Glycol 400 (BLINK TEARS) 0.25 % SOLN Place 1 drop into both eyes daily as needed (for dry eyes).    . pravastatin (PRAVACHOL) 20 MG tablet Take 20 mg by mouth every evening.     . traZODone (DESYREL) 50 MG tablet Take 1 tablet (50 mg total) by mouth at bedtime as needed for sleep. (Patient taking differently: Take 50 mg by mouth every evening. ) 30 tablet 0  . hydrocortisone (ANUSOL-HC) 2.5 % rectal cream Place 1 application rectally  2 (two) times daily. (Patient not taking: Reported on 12/21/2017) 30 g 1  . hydrocortisone (ANUSOL-HC) 25 MG suppository Place 1 suppository (25 mg total) rectally every 12 (twelve) hours. (Patient not taking: Reported on 12/21/2017) 12 suppository 1  . pantoprazole (PROTONIX) 40 MG tablet Take 40-80 mg by mouth See admin instructions. Take 80 mg by mouth in the morning and take 40 mg by mouth in the evening at 1830     No current facility-administered medications on file prior to visit.     No Known Allergies  Social History   Substance and Sexual Activity  Alcohol Use Yes   Comment: rare    Social History   Tobacco Use  Smoking Status Former Smoker  . Packs/day: 3.00  . Years: 28.00  . Pack years: 84.00  . Types: Cigarettes  . Last attempt to quit: 05/22/1981  . Years since quitting: 36.7  Smokeless Tobacco Never Used    Review of Systems  Constitutional: Negative.   HENT: Positive for sinus pain.   Eyes: Negative.   Respiratory: Negative.   Cardiovascular: Negative.   Gastrointestinal: Negative.   Genitourinary: Negative.   Musculoskeletal: Negative.   Skin: Negative.   Neurological: Negative.   Endo/Heme/Allergies: Negative.   Psychiatric/Behavioral: Negative.     Objective   Vitals:   02/16/18 1056  BP: 130/66  Pulse: 73  Resp: 20  Temp: 98.9 F (37.2 C)    Physical Exam  Constitutional: He is oriented to person, place, and time. He appears well-developed and well-nourished. No distress.  HENT:  Head: Atraumatic.  Cardiovascular: Normal rate, regular rhythm and normal heart sounds. Exam reveals no gallop and no friction rub.  No murmur heard. Pulmonary/Chest: Effort normal and breath sounds normal. No stridor. No respiratory distress. He has no wheezes. He has no rales.  Abdominal: Soft. Bowel sounds are normal. He exhibits no distension. There is no tenderness. There is no guarding.  Genitourinary:  Genitourinary Comments: Rectal examination reveals  an external hemorrhoid noted along the right lateral aspect of the anus.  No bleeding is noted.  No irritation is noted.  Sphincter tone is normal.  No appreciable internal hemorrhoids are noted.   Neurological: He is alert and oriented to person, place, and time.  Skin: Skin is warm and dry.  Vitals reviewed.  Colonoscopy report reviewed Assessment  External hemorrhoidal skin tag Plan   No need for hemorrhoidectomy at this time.  Patient understands this and agrees.  Should he have recurrence of his symptoms, he was instructed to return to my care for examination.  Follow-up expectantly.

## 2018-02-16 NOTE — Patient Instructions (Signed)
Hemorrhoids Hemorrhoids are swollen veins in and around the rectum or anus. There are two types of hemorrhoids:  Internal hemorrhoids. These occur in the veins that are just inside the rectum. They may poke through to the outside and become irritated and painful.  External hemorrhoids. These occur in the veins that are outside of the anus and can be felt as a painful swelling or hard lump near the anus.  Most hemorrhoids do not cause serious problems, and they can be managed with home treatments such as diet and lifestyle changes. If home treatments do not help your symptoms, procedures can be done to shrink or remove the hemorrhoids. What are the causes? This condition is caused by increased pressure in the anal area. This pressure may result from various things, including:  Constipation.  Straining to have a bowel movement.  Diarrhea.  Pregnancy.  Obesity.  Sitting for long periods of time.  Heavy lifting or other activity that causes you to strain.  Anal sex.  What are the signs or symptoms? Symptoms of this condition include:  Pain.  Anal itching or irritation.  Rectal bleeding.  Leakage of stool (feces).  Anal swelling.  One or more lumps around the anus.  How is this diagnosed? This condition can often be diagnosed through a visual exam. Other exams or tests may also be done, such as:  Examination of the rectal area with a gloved hand (digital rectal exam).  Examination of the anal canal using a small tube (anoscope).  A blood test, if you have lost a significant amount of blood.  A test to look inside the colon (sigmoidoscopy or colonoscopy).  How is this treated? This condition can usually be treated at home. However, various procedures may be done if dietary changes, lifestyle changes, and other home treatments do not help your symptoms. These procedures can help make the hemorrhoids smaller or remove them completely. Some of these procedures involve  surgery, and others do not. Common procedures include:  Rubber band ligation. Rubber bands are placed at the base of the hemorrhoids to cut off the blood supply to them.  Sclerotherapy. Medicine is injected into the hemorrhoids to shrink them.  Infrared coagulation. A type of light energy is used to get rid of the hemorrhoids.  Hemorrhoidectomy surgery. The hemorrhoids are surgically removed, and the veins that supply them are tied off.  Stapled hemorrhoidopexy surgery. A circular stapling device is used to remove the hemorrhoids and use staples to cut off the blood supply to them.  Follow these instructions at home: Eating and drinking  Eat foods that have a lot of fiber in them, such as whole grains, beans, nuts, fruits, and vegetables. Ask your health care provider about taking products that have added fiber (fiber supplements).  Drink enough fluid to keep your urine clear or pale yellow. Managing pain and swelling  Take warm sitz baths for 20 minutes, 3-4 times a day to ease pain and discomfort.  If directed, apply ice to the affected area. Using ice packs between sitz baths may be helpful. ? Put ice in a plastic bag. ? Place a towel between your skin and the bag. ? Leave the ice on for 20 minutes, 2-3 times a day. General instructions  Take over-the-counter and prescription medicines only as told by your health care provider.  Use medicated creams or suppositories as told.  Exercise regularly.  Go to the bathroom when you have the urge to have a bowel movement. Do not wait.    Avoid straining to have bowel movements.  Keep the anal area dry and clean. Use wet toilet paper or moist towelettes after a bowel movement.  Do not sit on the toilet for long periods of time. This increases blood pooling and pain. Contact a health care provider if:  You have increasing pain and swelling that are not controlled by treatment or medicine.  You have uncontrolled bleeding.  You  have difficulty having a bowel movement, or you are unable to have a bowel movement.  You have pain or inflammation outside the area of the hemorrhoids. This information is not intended to replace advice given to you by your health care provider. Make sure you discuss any questions you have with your health care provider. Document Released: 06/06/2000 Document Revised: 11/07/2015 Document Reviewed: 02/21/2015 Elsevier Interactive Patient Education  2018 [employer]  

## 2018-03-22 ENCOUNTER — Ambulatory Visit: Payer: Medicare Other | Admitting: Gastroenterology

## 2018-09-23 ENCOUNTER — Telehealth: Payer: Self-pay | Admitting: Neurology

## 2018-09-23 NOTE — Telephone Encounter (Addendum)
error 

## 2018-09-27 NOTE — Telephone Encounter (Signed)
Due to current COVID 19 pandemic, our office is severely reducing in office visits for at least the next 2 weeks, in order to minimize the risk to our patients and healthcare providers.   I called pt. He is agreeable to converting his appt to a virtual visit.  Pt understands that although there may be some limitations with this type of visit, we will take all precautions to reduce any security or privacy concerns.  Pt understands that this will be treated like an in office visit and we will file with pt's insurance, and there may be a patient responsible charge related to this service.  Pt's email is [email or web address]. Pt understands that the [employer] webex software must be downloaded and operational on the device pt plans to use for the visit.  Pt's meds, allergies, and PMH were updated.  Pt's bipap uses a chip download. He is unable to bring this chip to the office prior to the appt. He is wondering if he can have a new bipap.  Pt's recent weight is 265lbs and he is 5'10.

## 2018-09-29 ENCOUNTER — Encounter: Payer: Self-pay | Admitting: Neurology

## 2018-09-30 ENCOUNTER — Ambulatory Visit (INDEPENDENT_AMBULATORY_CARE_PROVIDER_SITE_OTHER): Payer: Medicare Other | Admitting: Neurology

## 2018-09-30 ENCOUNTER — Encounter: Payer: Self-pay | Admitting: Neurology

## 2018-09-30 ENCOUNTER — Ambulatory Visit: Payer: Medicare Other | Admitting: Nurse Practitioner

## 2018-09-30 DIAGNOSIS — G4733 Obstructive sleep apnea (adult) (pediatric): Secondary | ICD-10-CM | POA: Diagnosis not present

## 2018-09-30 NOTE — Progress Notes (Signed)
Interim history:  Dakota Thompson is a 73 year old right-handed gentleman with an underlying medical history of neuropathy, obesity and restless leg syndrome and mild memory loss, with whom I am conducting a virtual, video based follow-up visit via Webex, in lieu of a face-to-face visit today for follow-up consultation of his obstructive sleep apnea for which he is on treatment with BiPAP. He presents for a one-year checkup. He is unaccompanied and joins from home. I last saw him on 09/24/17, at which time he was fully compliant with his BiPAP. He had an interim hospitalization in July 2018 for a perforated sigmoid diverticulitis. He also had interim knee replacement surgery in April 2018 on the right side. He presented to the ER in September 2018 with abdominal pain and was treated for sigmoid diverticulitis and had recurrent flareup of this and abdominal pain, was on a round of antibiotic at our last visit as well.   Today, 09/30/2018: Please also see below for virtual visit documentation.   A BiPAP compliance download was not available today as his machine has a compliance chip rather than remote download capacity.   The patient's allergies, current medications, family history, past medical history, past social history, past surgical history and problem list were reviewed and updated as appropriate.    Previously (copied from previous notes for reference):   I saw him on 09/18/2016, at which time he reported doing okay with his BiPAP. His memory was stable. He reported bilateral knee pain and was supposed to have a right knee replacement surgery.    I reviewed his BiPAP compliance data from 08/22/2017 through 09/20/2017, which is a total of 30 days, during which time he used his PAP every night with percent used days greater than 4 hours at 100%, indicating superb compliance with an average usage of 8 hours and 35 minutes, residual AHI at goal at 1.1 per hour, leak on the lower end, pressure of 12/8 cm.      I saw him on 09/13/2015, at which time he was fully compliant with BiPAP. He had gained a little bit of weight. We talked about his cognitive test results at the time. He had seen Dr. Valentina Shaggy on 06/11/2015 and then for discussion on 06/21/2015, diagnosis was memory loss. Repeat cognitive testing was recommended after one or 2 years for comparison.    I reviewed his BiPAP compliance from 08/19/2016 through 09/17/2016 which is a total of 30 days, during which time he used his machine every night with percent used days greater than 4 hours at 100%, indicating superb compliance with an average usage of 8 hours and 24 minutes, residual AHI low at 1.7 per hour, pressure of 12/8, leak very low.    I saw him on 03/08/2015, at which time he reported being able to tolerate BiPAP a little bit better. Memory-wise, he felt a little worse. He had knee pain. I reviewed his brain MRI from 03/07/2014 through the PACS system today. I suggested we pursue formal neuropsychological evaluation. I referred him to Dr. Valentina Shaggy.    I reviewed his BiPAP compliance data from 08/14/2015 through 09/12/2015 which is a total of 30 days during which time he used his machine every night with percent used days greater than 4 hours at 100%, indicating superb compliance with an average usage of 8 hours and 13 minutes, residual AHI 1.2 per hour, leak low, pressure at 12/8 cm.   I saw him on 09/15/2014, at which time he reported R cataract surgery on 08/03/14  and L cataract repair on 08/31/14 with good results. His wife was worried about his lack of mobility. He was not exercising. He was reporting knee pain. He was not drinking enough water. He was struggling with BiPAP treatment. His wife had noted less twitching of his legs and less snoring and he seemed to sleep more consolidated. As far as his memory, his long-term memory much improved either. He felt he had issues with OCD. He could not tolerate the chin strap.     I reviewed his BiPAP  compliance data from 02/06/2015 through 03/07/2015 which is a total of 30 days during which time he used his machine every night with percent used days greater than 4 hours at 100%, indicating superb compliance with an average usage of 7 hours and 36 minutes, pressure of 12/8, residual AHI 1.3, leaked low.    He had a brain MRI wo contrast on 03/08/15: Mild cerebral atrophy, otherwise unremarkable appearance of the brain.   I first met him on 12/30/2013 at the request of Dr. Janann Colonel, at which time we talked about his recent sleep test results including his baseline sleep study from March 2015 as well as a CPAP titration study from April 2015 and we talked about his compliance data on how he felt. He reported sleeping better and he was twitching less during his sleep. In the interim, he was seen by Dr. 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 His MOCA score was 27 at the time.   I reviewed his BiPAP compliance data from 03/24/14 to 06/21/14, which is a total of 90 days, during which time he used his machine every night, with percent used days greater than 4 hours of 91.1%, indicating excellent compliance, with an average usage of 6 hours and 28 minutes, pressure at 12/8, residual AHI at 1.5 per hour, leak low.   I reviewed his BiPAP compliance data from 08/16/2014 through 09/14/2014 which is a total of 30 days during which time he used his machine every night with percent used days greater than 4 hours at 96.7%, indicating excellent compliance. Pressure setting the same, residual AHI low at 1.7 per hour, leak low. Average usage of 6 hours and 37 minutes for all nights.   His baseline sleep study from 09/06/2013 showed a sleep efficiency was reduced at 66.6% with a latency to sleep of 13.5 minutes and wake after sleep onset of 131 minutes with moderate sleep fragmentation noted. He had an elevated arousal index. He had an increased percentage of stage II sleep, 5.6% of slow-wave sleep, and 19.1% of REM  sleep with a significantly reduced REM latency of 2 minutes. He had mild periodic leg movements at 17.9 per hour resulting in 4 arousals per hour. He had mild to moderate snoring. He did not achieve much in the way of supine sleep reporting that he's not able to sleep on his back. He had one central apnea and 25 obstructive hypopneas with a total AHI of 5.4 per hour, rising to 9.8 per hour in REM sleep. Baseline oxygen saturation was only 90%, nadir was 84%. Time below 88% saturation was 8 minutes and 18 seconds. He was then requested to come back for a CPAP titration study to help his sleep disordered breathing and in light of his significant desaturations. He had a CPAP titration study on 10/06/2013 which showed a sleep efficiency of 65.4% with a prolonged sleep latency of 59.5 minutes and wake after sleep onset of 106.5 minutes with moderate sleep  fragmentation noted. He had an elevated arousal index at 13.4 per hour primarily because of spontaneous arousals. He had an increased percentage of light stage sleep, 1.6% of slow-wave sleep, and a mildly decreased percentage of REM sleep at 17.5% with a prolonged REM latency. He had mild PLMS a 10.7 per hour with an associated arousal index of 1.7 per hour. Baseline oxygen saturation was 91%, nadir was 88%. He was started on CPAP at 5 cm. However he was not able to tolerate this and felt panicked. He did not tolerate the nasal pillows mask or nasal mask and did somewhat better with a full facemask. He was switched to BiPAP for better tolerance of the pressures were titrated from 8/4 cm to 12/8 cm. His oxygen saturation appeared to be better on the final setting of 12/8 cm. REM sleep was achieved on the final pressure but very little supine sleep was achieved during the study. His AHI was 0 on the final pressure. Based on the test results I prescribed BiPAP for him.     I reviewed the patient's PAP compliance data from 11/02/13 to 12/01/2013, which is a total of 30  days, during which time the patient used BiPAP.  The average usage for all days was 6 hours and 29 minutes. The percent used days greater than 4 hours was 100 %, indicating superb compliance. The residual AHI was 2.1 per hour, indicating an adequate treatment pressure of 12/8 cwp with low leak reported.   His typical bedtime is reported to be around 11 PM. He reports that he does not really like using his BiPAP but he is getting used to it. Provides report he seems to sleep more soundly and is less restless in his sleep. He has not noted any improvement in his cognitive function yet. He is wondering if he will have to use this machine longer term and whether he has to keep using it.    His Past Medical History Is Significant For: Past Medical History:  Diagnosis Date   Anemia    Anxiety    Cancer ([place])    Skin   Charcot-Marie-Tooth disease    hx, ?not confirmed by neurology most recently, neuropathy   GERD (gastroesophageal reflux disease)    Hx of adenomatous colonic polyps    tubulovillous adenoma   Hypertension    Pancytopenia    mild   Pancytopenia 02/27/2011   Restless leg syndrome    Sleep apnea    wears CPAP/BIPAP every night   Thyroid disease    Viral syndrome    Hx   Viral syndrome 02/27/2011    His Past Surgical History Is Significant For: Past Surgical History:  Procedure Laterality Date   BIOPSY  12/31/2017   Procedure: BIOPSY;  Surgeon: Daneil Dolin, MD;  Location: [facility];  Service: Endoscopy;;  gastric biopsy    COLONOSCOPY  01/02/2010   RMR: normal rectum and colon. marginal prep compromised exam.    COLONOSCOPY N/A 03/15/2015   Dr. Gala Romney: tubular adenoma, colonic diverticulosis, surveillance due 2021   COLONOSCOPY WITH PROPOFOL N/A 12/31/2017   Procedure: COLONOSCOPY WITH PROPOFOL;  Surgeon: Daneil Dolin, MD;  Location: [facility];  Service: Endoscopy;  Laterality: N/A;  2:00pm   ESOPHAGOGASTRODUODENOSCOPY N/A 03/15/2015   2 cm  hiatal hernia, otherwise normal   ESOPHAGOGASTRODUODENOSCOPY (EGD) WITH PROPOFOL N/A 12/31/2017   Procedure: ESOPHAGOGASTRODUODENOSCOPY (EGD) WITH PROPOFOL;  Surgeon: Daneil Dolin, MD;  Location: [facility];  Service: Endoscopy;  Laterality:  N/A;   EYE SURGERY     bilateral cataracts   GANGLION CYST EXCISION Right    knee cartilage repair Bilateral 01/31/2011   NASAL SINUS SURGERY     removal skin cancer      nose   TOTAL KNEE ARTHROPLASTY Right 10/03/2016   Procedure: RIGHT TOTAL KNEE ARTHROPLASTY;  Surgeon: Sydnee Cabal, MD;  Location: [facility];  Service: Orthopedics;  Laterality: Right;    His Family History Is Significant For: Family History  Problem Relation Age of Onset   Colon cancer Brother        older than 103    Colon cancer Sister        older than 52     His Social History Is Significant For: Social History   Socioeconomic History   Marital status: Married    Spouse name: Production manager    Number of children: 2   Years of education: Not on file   Highest education level: Not on file  Occupational History   Occupation: Retired    Fish farm manager: RETIRED  Scientist, product/process development strain: Not on file   Food insecurity:    Worry: Not on file    Inability: Not on file   Transportation needs:    Medical: Not on file    Non-medical: Not on file  Tobacco Use   Smoking status: Former Smoker    Packs/day: 3.00    Years: 28.00    Pack years: 84.00    Types: Cigarettes    Last attempt to quit: 05/22/1981    Years since quitting: 37.3   Smokeless tobacco: Never Used  Substance and Sexual Activity   Alcohol use: Yes    Comment: rare   Drug use: No   Sexual activity: Yes  Lifestyle   Physical activity:    Days per week: Not on file    Minutes per session: Not on file   Stress: Not on file  Relationships   Social connections:    Talks on phone: Not on file    Gets together: Not on file    Attends religious service: Not on file     Active member of club or organization: Not on file    Attends meetings of clubs or organizations: Not on file    Relationship status: Not on file  Other Topics Concern   Not on file  Social History Narrative   Patient lives at home with his wife.    Patient has 2 children.    Patient is retired.     His Allergies Are:  No Known Allergies:   His Current Medications Are:  Outpatient Encounter Medications as of 09/30/2018  Medication Sig   aspirin EC 81 MG tablet Take 81 mg by mouth daily.   celecoxib (CELEBREX) 100 MG capsule Take 100 mg by mouth 2 (two) times daily.    Coenzyme Q10 200 MG capsule Take 200 mg by mouth every evening.   dexlansoprazole (DEXILANT) 60 MG capsule Take 1 capsule (60 mg total) by mouth daily.   docusate sodium (COLACE) 100 MG capsule Take 200 mg by mouth daily.   gabapentin (NEURONTIN) 300 MG capsule Take 600 mg by mouth every evening.    Multiple Vitamin (MULTIVITAMIN WITH MINERALS) TABS tablet Take 1 tablet by mouth daily. Centrum Silver   olmesartan (BENICAR) 40 MG tablet Take 40 mg by mouth daily.   Omega-3 Fatty Acids (FISH OIL) 1200 MG CAPS Take 1,200-2,400 mg by mouth See admin instructions. [place]  mg in the morning & 1200 mg in the evening.    Polyethylene Glycol 400 (BLINK TEARS) 0.25 % SOLN Place 1 drop into both eyes daily as needed (for dry eyes).   pravastatin (PRAVACHOL) 20 MG tablet Take 20 mg by mouth daily.   traZODone (DESYREL) 50 MG tablet Take 1 tablet (50 mg total) by mouth at bedtime as needed for sleep. (Patient taking differently: Take 50 mg by mouth every evening. )   No facility-administered encounter medications on file as of 09/30/2018.   :  Review of Systems:  Out of a complete 14 point review of systems, all are reviewed and negative with the exception of these symptoms as listed below:  Virtual Visit via Video Note on 09/30/2018:  I connected with Mr. Keep on 09/30/18 at  3:00 PM EDT by a video enabled telemedicine  application and verified that I am speaking with the correct person using two identifiers.   I discussed the limitations of evaluation and management by telemedicine and the availability of in person appointments. The patient expressed understanding and agreed to proceed.  History of Present Illness: He reports no recent illness thankfully. His diverticulitis is stable and has not caused him any recent flareup think fully. His memory is more or less stable. He was worried that he could not do the virtual visit because of his hearing impairment but he feels he is doing just fine with it. He is up-to-date with his BiPAP supplies. He gets his supplies from [employer] in [place], [place]. He would be willing to send [place] his compliance card in the mail for a updated download. I looked in the chart, he may have started his BiPAP with his current machine about 5 years ago, coming up in May of this year. He may be eligible for a new machine and hopefully at the time. He still struggles with a compliance some days, the mask is uncomfortable at times. Nevertheless, he is willing to continue with treatment. He tries to hydrate well with water. He tries to stay active. He denies any acute medical problems recently thankfully.   Observations/Objective: He is very pleasant, conversant, in no acute distress. Vital signs are not available for my review, latest vitals are from August 2019. Face is symmetric, speech clear, not dysarthric, he may be mildly hard of hearing. Extraocular movements are preserved. Facial grimacing normal. Upper body movements are unremarkable, coordination grossly intact.  Assessment and Plan: [place], [place] a very pleasant 61 year oldmalewith an underlying medical history of hearing loss, cognitive complaints, restless leg syndrome, history of neuropathy, arthritis of both knees with status post right knee replacement surgery in April 2018, recurrent diverticulitis  with complications in the past, and obesity, who presents for a virtual, video based follow-up consultation of his obstructive sleep apnea, established on BiPAP therapy at a pressure of 12/8 cm. He has had full compliance, most recent data is pending. He is willing to send [place] the SD card from the machine and we will download the data when we get the chip.he may be eligible for a new BiPAP machine coming up in May of this year. He is advised that he will need a follow-up appointment after starting his new machine even though we don't change the settings on it. He will most likely be required to be seen between 60-90 days after starting the new equipment. He's willing to call us in about a month around mid-May perhaps so we can look into prescribing a  new BiPAP machine for him. Memory has been generally stable. Of note, he had neuropsychological testing in December 2016 with quite reassuring findings. From the sleep apnea standpoint, he is doing well.  I suggested a 1 year follow-up and he may need an interim follow-up in August or so if he is able to start using a new BiPAP machine.I answered all his questions today and he was in agreement.  Follow Up Instructions: 1. Continue using BiPAP regularly with full compliance, patient is commended for treatment adherence. He's willing to send [place] the compliance data card in the mail, we will mail it back after getting a download from it. 2. Follow-up yearly, and in or around August if he starts a new BiPAP machine.  3. He is up-to-date with his BiPAP supplies, received supplies in the mail about 3 weeks ago. He is willing to call our office in mid May so we can send a order for a new BiPAP machine to his DME company in [place], [place].   I discussed the assessment and treatment plan with the patient. The patient was provided an opportunity to ask questions and all were answered. The patient agreed with the plan and demonstrated an understanding of the instructions.     The patient was advised to call back or seek an in-person evaluation if the symptoms worsen or if the condition fails to improve as anticipated.   I provided 25 minutes of non-face-to-face time during this encounter.   Star Age, MD

## 2018-09-30 NOTE — Patient Instructions (Signed)
Given verbally, during today's virtual video-based encounter, with verbal feedback received.   

## 2018-10-04 NOTE — Telephone Encounter (Signed)
Received cpap chip via mail from pt. Download given to Dr. Rexene Alberts for review. Mailed chip back to pt.

## 2018-10-04 NOTE — Telephone Encounter (Signed)
I reviewed patient's BiPAP compliance data from 08/31/2018 through 09/29/2018 which is a total of 30 days, during which time he used his machine every night with percent used days greater than 4 hours at 100%, indicating superb compliance with an average usage of 8 hours and 36 minutes, residual AHI at goal at 0.9 per hour, leak acceptable, pressure at 12/8.

## 2018-10-05 NOTE — Telephone Encounter (Signed)
I called pt and discussed this with him. Pt verbalized understanding. Pt had no questions at this time but was encouraged to call back if questions arise.

## 2018-10-06 ENCOUNTER — Telehealth: Payer: Self-pay | Admitting: Neurology

## 2018-10-06 NOTE — Telephone Encounter (Signed)
LVM after virtual visit to schedule 1 year follow-up per Dr. Rexene Alberts.

## 2019-01-05 ENCOUNTER — Other Ambulatory Visit: Payer: Self-pay

## 2019-01-05 MED ORDER — DEXILANT 60 MG PO CPDR: 60.0000 mg | DELAYED_RELEASE_CAPSULE | Freq: Every day | ORAL | 3 refills | Status: DC

## 2019-01-06 ENCOUNTER — Other Ambulatory Visit (HOSPITAL_COMMUNITY): Payer: Self-pay | Admitting: Internal Medicine

## 2019-01-06 ENCOUNTER — Other Ambulatory Visit: Payer: Self-pay | Admitting: Internal Medicine

## 2019-01-06 DIAGNOSIS — R0989 Other specified symptoms and signs involving the circulatory and respiratory systems: Secondary | ICD-10-CM

## 2019-01-10 ENCOUNTER — Encounter: Payer: Self-pay | Admitting: Internal Medicine

## 2019-01-12 ENCOUNTER — Ambulatory Visit (HOSPITAL_COMMUNITY)
Admission: RE | Admit: 2019-01-12 | Discharge: 2019-01-12 | Disposition: A | Discharge status: Home / Self Care | Payer: Medicare Other | Source: Ambulatory Visit | Attending: Internal Medicine | Admitting: Internal Medicine

## 2019-01-12 ENCOUNTER — Other Ambulatory Visit: Payer: Self-pay

## 2019-01-12 DIAGNOSIS — R0989 Other specified symptoms and signs involving the circulatory and respiratory systems: Secondary | ICD-10-CM | POA: Diagnosis not present

## 2019-02-24 ENCOUNTER — Ambulatory Visit: Payer: Medicare Other | Admitting: Gastroenterology

## 2019-03-16 IMAGING — DX DG CHEST 2V
2 series · 2 of 2 positions shown · non-contrast
Comparison: Yesterday

CLINICAL DATA: Productive cough and shortness of breath

EXAM:
CHEST  2 VIEW

[chest pa]
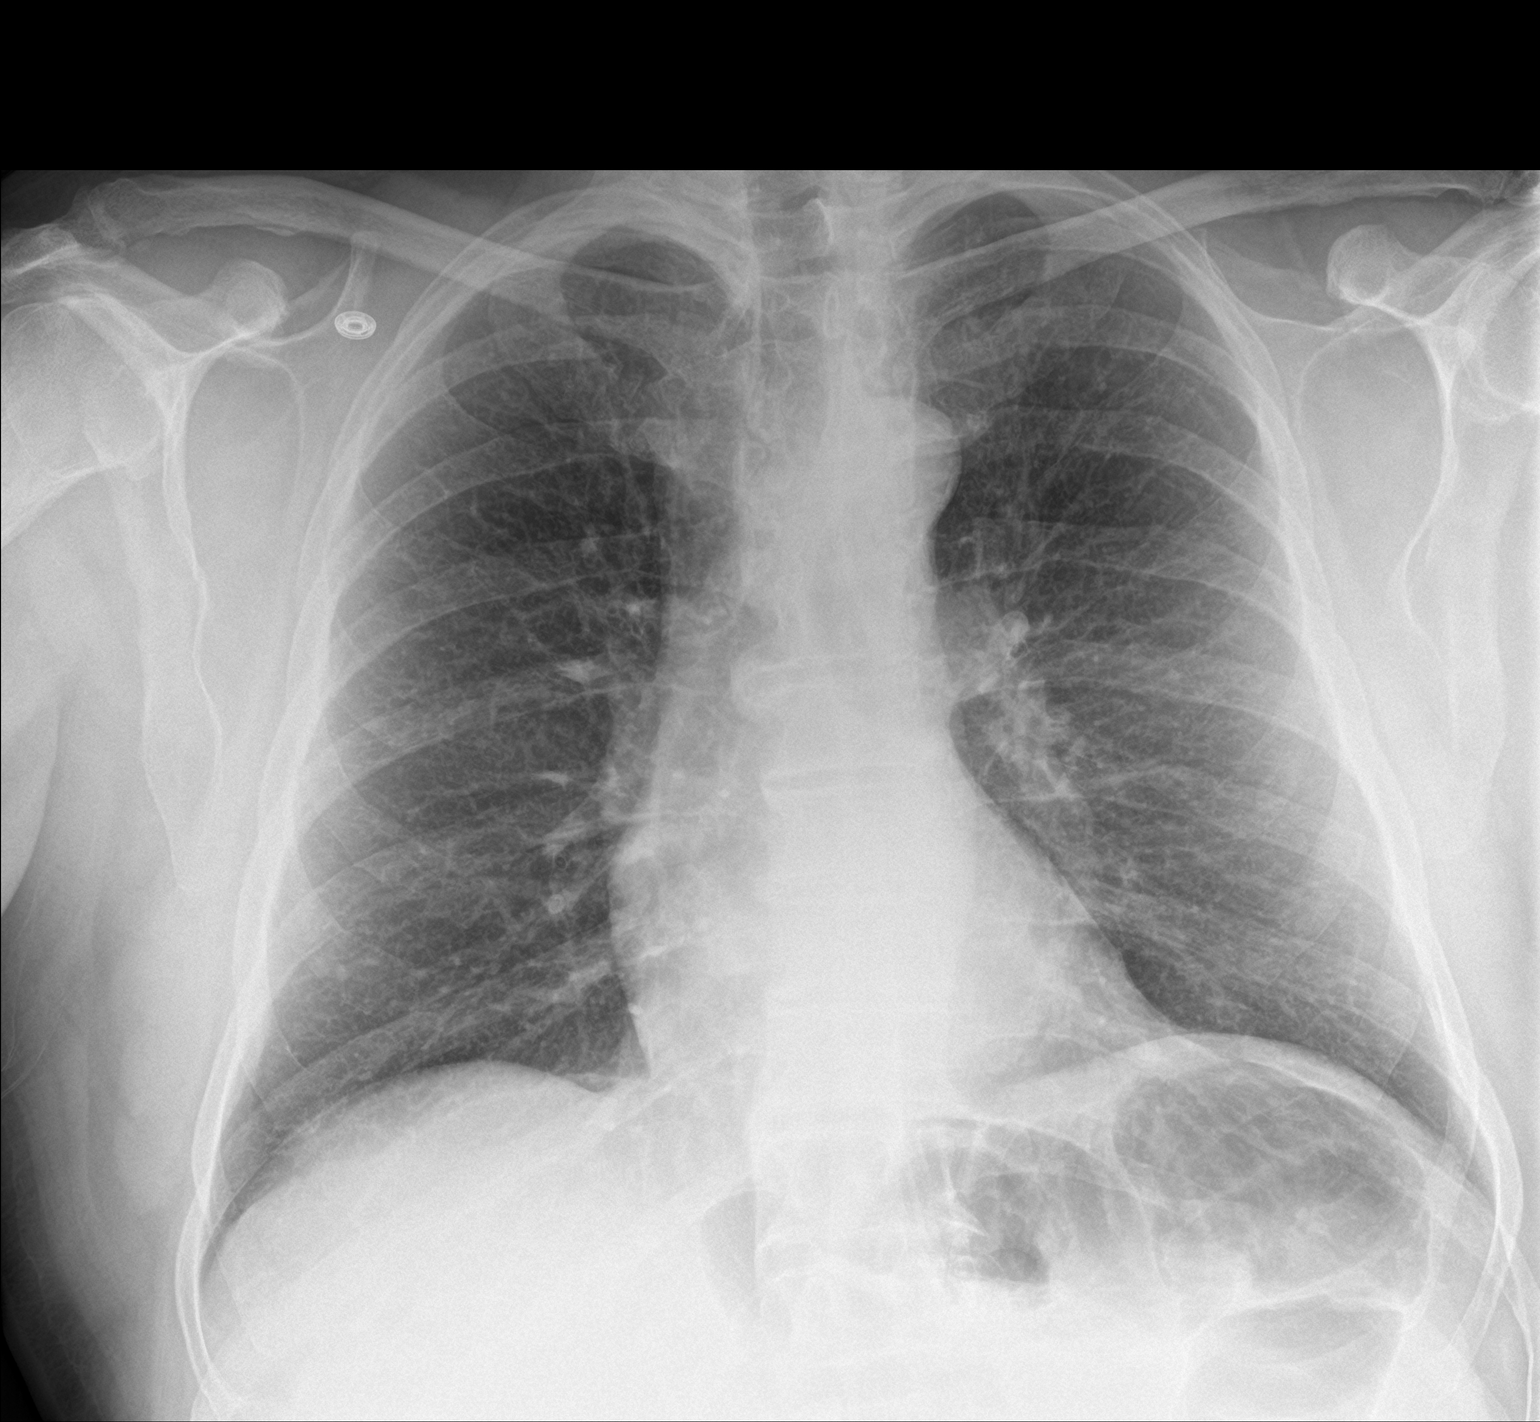

[chest lat]
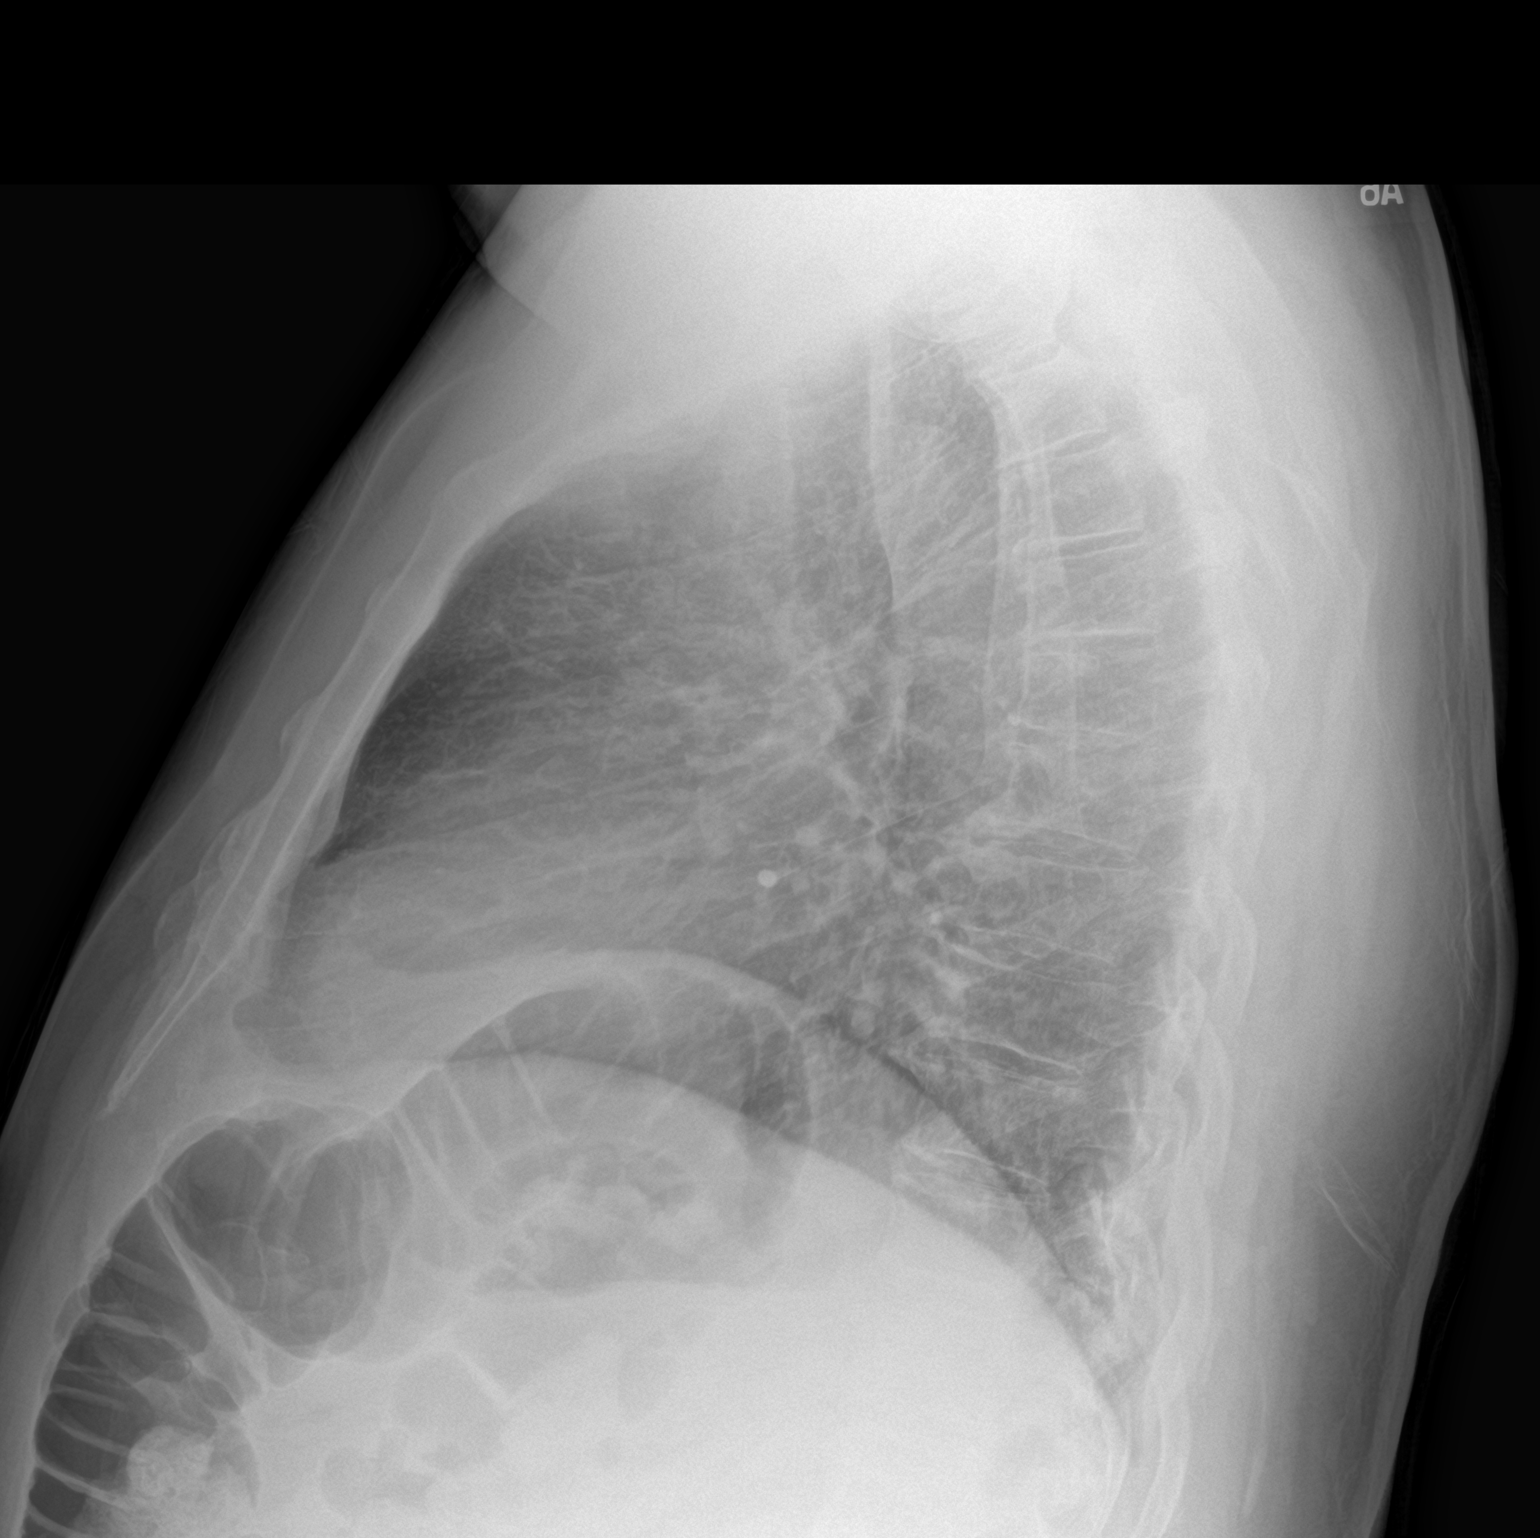

[2 of 2 positions shown; findings below may reference images not displayed]

FINDINGS: Improved aeration in lung volumes. Heart size is normalized and
there is no focal lung opacity. Negative for edema or effusion.
IMPRESSION: Improved inflation.  No edema or focal pneumonia.

## 2019-09-29 ENCOUNTER — Telehealth: Payer: Self-pay

## 2019-09-29 NOTE — Telephone Encounter (Signed)
I called pt and reminded him to bring his bipap to his appt on Monday. Pt verbalized understanding.

## 2019-10-03 ENCOUNTER — Other Ambulatory Visit: Payer: Self-pay

## 2019-10-03 ENCOUNTER — Encounter: Payer: Self-pay | Admitting: Neurology

## 2019-10-03 ENCOUNTER — Ambulatory Visit: Payer: Medicare Other | Admitting: Neurology

## 2019-10-03 VITALS — BP 145/80 | HR 73 | Ht 70.0 in | Wt 254.0 lb

## 2019-10-03 DIAGNOSIS — G4733 Obstructive sleep apnea (adult) (pediatric): Secondary | ICD-10-CM

## 2019-10-03 NOTE — Progress Notes (Signed)
Subjective:    Patient ID: Dakota Thompson is a 74 y.o. male.  HPI     Interim history:   Dakota Thompson is a 74 year old right-handed gentleman with an underlying medical history of neuropathy, obesity and restless leg syndrome and mild memory loss, Who presents for follow-up consultation of his obstructive sleep apnea, on treatment with BiPAP therapy.  The patient is unaccompanied today.  I last saw him on 09/30/2018 in a virtual visit.  He indicated that he was interested in a new machine.  He was advised to call back so we could prescribe a new machine through his DME company located in [place], [place].  He was otherwise advised to follow-up routinely in 1 year.  Today, 10/03/19: I was not able to review his BiPAP compliance data as he did not bring his machine. He reports compliance with his machine. He would be willing to take his machine to his DME for a download in the next 2-3 days. He would like to get a new machine. He has continued to benefit from treatment and is motivated to continue. He has received his Covid vaccine.     The patient's allergies, current medications, family history, past medical history, past social history, past surgical history and problem list were reviewed and updated as appropriate.    Previously (copied from previous notes for reference):   I saw him on 09/24/17, at which time he was fully compliant with his BiPAP. He had an interim hospitalization in July 2018 for a perforated sigmoid diverticulitis. He also had interim knee replacement surgery in April 2018 on the right side. He presented to the ER in September 2018 with abdominal pain and was treated for sigmoid diverticulitis and had recurrent flareup of this and abdominal pain, was on a round of antibiotic at our last visit as well.      I saw him on 09/18/2016, at which time he reported doing okay with his BiPAP. His memory was stable. He reported bilateral knee pain and was supposed to have a right knee  replacement surgery.    I reviewed his BiPAP compliance data from 08/22/2017 through 09/20/2017, which is a total of 30 days, during which time he used his PAP every night with percent used days greater than 4 hours at 100%, indicating superb compliance with an average usage of 8 hours and 35 minutes, residual AHI at goal at 1.1 per hour, leak on the lower end, pressure of 12/8 cm.    I saw him on 09/13/2015, at which time he was fully compliant with BiPAP. He had gained a little bit of weight. We talked about his cognitive test results at the time. He had seen Dr. Valentina Shaggy on 06/11/2015 and then for discussion on 06/21/2015, diagnosis was memory loss. Repeat cognitive testing was recommended after one or 2 years for comparison.    I reviewed his BiPAP compliance from 08/19/2016 through 09/17/2016 which is a total of 30 days, during which time he used his machine every night with percent used days greater than 4 hours at 100%, indicating superb compliance with an average usage of 8 hours and 24 minutes, residual AHI low at 1.7 per hour, pressure of 12/8, leak very low.    I saw him on 03/08/2015, at which time he reported being able to tolerate BiPAP a little bit better. Memory-wise, he felt a little worse. He had knee pain. I reviewed his brain MRI from 03/07/2014 through the PACS system today. I suggested we pursue  formal neuropsychological evaluation. I referred him to Dr. Valentina Shaggy.    I reviewed his BiPAP compliance data from 08/14/2015 through 09/12/2015 which is a total of 30 days during which time he used his machine every night with percent used days greater than 4 hours at 100%, indicating superb compliance with an average usage of 8 hours and 13 minutes, residual AHI 1.2 per hour, leak low, pressure at 12/8 cm.   I saw him on 09/15/2014, at which time he reported R cataract surgery on 08/03/14 and L cataract repair on 08/31/14 with good results. His wife was worried about his lack of mobility. He was  not exercising. He was reporting knee pain. He was not drinking enough water. He was struggling with BiPAP treatment. His wife had noted less twitching of his legs and less snoring and he seemed to sleep more consolidated. As far as his memory, his long-term memory much improved either. He felt he had issues with OCD. He could not tolerate the chin strap.     I reviewed his BiPAP compliance data from 02/06/2015 through 03/07/2015 which is a total of 30 days during which time he used his machine every night with percent used days greater than 4 hours at 100%, indicating superb compliance with an average usage of 7 hours and 36 minutes, pressure of 12/8, residual AHI 1.3, leaked low.    He had a brain MRI wo contrast on 03/08/15: Mild cerebral atrophy, otherwise unremarkable appearance of the brain.   I first met him on 12/30/2013 at the request of Dr. Janann Colonel, at which time we talked about his recent sleep test results including his baseline sleep study from March 2015 as well as a CPAP titration study from April 2015 and we talked about his compliance data on how he felt. He reported sleeping better and he was twitching less during his sleep. In the interim, he was seen by Dr. 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 His MOCA score was 27 at the time.   I reviewed his BiPAP compliance data from 03/24/14 to 06/21/14, which is a total of 90 days, during which time he used his machine every night, with percent used days greater than 4 hours of 91.1%, indicating excellent compliance, with an average usage of 6 hours and 28 minutes, pressure at 12/8, residual AHI at 1.5 per hour, leak low.   I reviewed his BiPAP compliance data from 08/16/2014 through 09/14/2014 which is a total of 30 days during which time he used his machine every night with percent used days greater than 4 hours at 96.7%, indicating excellent compliance. Pressure setting the same, residual AHI low at 1.7 per hour, leak low. Average usage  of 6 hours and 37 minutes for all nights.   His baseline sleep study from 09/06/2013 showed a sleep efficiency was reduced at 66.6% with a latency to sleep of 13.5 minutes and wake after sleep onset of 131 minutes with moderate sleep fragmentation noted. He had an elevated arousal index. He had an increased percentage of stage II sleep, 5.6% of slow-wave sleep, and 19.1% of REM sleep with a significantly reduced REM latency of 2 minutes. He had mild periodic leg movements at 17.9 per hour resulting in 4 arousals per hour. He had mild to moderate snoring. He did not achieve much in the way of supine sleep reporting that he's not able to sleep on his back. He had one central apnea and 25 obstructive hypopneas with a total AHI of  5.4 per hour, rising to 9.8 per hour in REM sleep. Baseline oxygen saturation was only 90%, nadir was 84%. Time below 88% saturation was 8 minutes and 18 seconds. He was then requested to come back for a CPAP titration study to help his sleep disordered breathing and in light of his significant desaturations. He had a CPAP titration study on 10/06/2013 which showed a sleep efficiency of 65.4% with a prolonged sleep latency of 59.5 minutes and wake after sleep onset of 106.5 minutes with moderate sleep fragmentation noted. He had an elevated arousal index at 13.4 per hour primarily because of spontaneous arousals. He had an increased percentage of light stage sleep, 1.6% of slow-wave sleep, and a mildly decreased percentage of REM sleep at 17.5% with a prolonged REM latency. He had mild PLMS a 10.7 per hour with an associated arousal index of 1.7 per hour. Baseline oxygen saturation was 91%, nadir was 88%. He was started on CPAP at 5 cm. However he was not able to tolerate this and felt panicked. He did not tolerate the nasal pillows mask or nasal mask and did somewhat better with a full facemask. He was switched to BiPAP for better tolerance of the pressures were titrated from 8/4 cm to 12/8  cm. His oxygen saturation appeared to be better on the final setting of 12/8 cm. REM sleep was achieved on the final pressure but very little supine sleep was achieved during the study. His AHI was 0 on the final pressure. Based on the test results I prescribed BiPAP for him.     I reviewed the patient's PAP compliance data from 11/02/13 to 12/01/2013, which is a total of 30 days, during which time the patient used BiPAP.  The average usage for all days was 6 hours and 29 minutes. The percent used days greater than 4 hours was 100 %, indicating superb compliance. The residual AHI was 2.1 per hour, indicating an adequate treatment pressure of 12/8 cwp with low leak reported.   His typical bedtime is reported to be around 11 PM. He reports that he does not really like using his BiPAP but he is getting used to it. Provides report he seems to sleep more soundly and is less restless in his sleep. He has not noted any improvement in his cognitive function yet. He is wondering if he will have to use this machine longer term and whether he has to keep using it.    His Past Medical History Is Significant For: Past Medical History:  Diagnosis Date  . Anemia   . Anxiety   . Cancer (HCC)    Skin  . 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 GERD (gastroesophageal reflux disease)   . Hx of adenomatous colonic polyps    tubulovillous adenoma  . Hypertension   . Pancytopenia    mild  . Pancytopenia 02/27/2011  . Restless leg syndrome   . Sleep apnea    wears CPAP/BIPAP every night  . Thyroid disease   . Viral syndrome    Hx  . Viral syndrome 02/27/2011    His Past Surgical History Is Significant For: Past Surgical History:  Procedure Laterality Date  . BIOPSY  12/31/2017   Procedure: BIOPSY;  Surgeon: Daneil Dolin, MD;  [facility]: [facility];  Service: Endoscopy;;  gastric biopsy   . COLONOSCOPY  01/02/2010   RMR: normal rectum and colon.  marginal prep compromised exam.   . COLONOSCOPY  N/A 03/15/2015   Dr. Gala Romney: tubular adenoma, colonic diverticulosis, surveillance due 2021  . COLONOSCOPY WITH PROPOFOL N/A 12/31/2017   Procedure: COLONOSCOPY WITH PROPOFOL;  Surgeon: Daneil Dolin, MD;  Location: [facility];  Service: Endoscopy;  Laterality: N/A;  2:00pm  . ESOPHAGOGASTRODUODENOSCOPY N/A 03/15/2015   2 cm hiatal hernia, otherwise normal  . ESOPHAGOGASTRODUODENOSCOPY (EGD) WITH PROPOFOL N/A 12/31/2017   Procedure: ESOPHAGOGASTRODUODENOSCOPY (EGD) WITH PROPOFOL;  Surgeon: Daneil Dolin, MD;  Location: [facility];  Service: Endoscopy;  Laterality: N/A;  . EYE SURGERY     bilateral cataracts  . GANGLION CYST EXCISION Right   . knee cartilage repair Bilateral 01/31/2011  . NASAL SINUS SURGERY    . removal skin cancer      nose  . TOTAL KNEE ARTHROPLASTY Right 10/03/2016   Procedure: RIGHT TOTAL KNEE ARTHROPLASTY;  Surgeon: Sydnee Cabal, MD;  Location: [facility];  Service: Orthopedics;  Laterality: Right;    His Family History Is Significant For: Family History  Problem Relation Age of Onset  . Colon cancer Brother        older than 39   . Colon cancer Sister        older than 78     His Social History Is Significant For: Social History   Socioeconomic History  . Marital status: Married    Spouse name: Adela Lank   . Number of children: 2  . Years of education: Not on file  . Highest education level: Not on file  Occupational History  . Occupation: Retired    Fish farm manager: RETIRED  Tobacco Use  . Smoking status: Former Smoker    Packs/day: 3.00    Years: 28.00    Pack years: 84.00    Types: Cigarettes    Quit date: 05/22/1981    Years since quitting: 38.3  . Smokeless tobacco: Never Used  Substance and Sexual Activity  . Alcohol use: Yes    Comment: rare  . Drug use: No  . Sexual activity: Yes  Other Topics Concern  . Not on file  Social History Narrative   Patient lives at home with his wife.     Patient has 2 children.    Patient is retired.    Social Determinants of Health   Financial Resource Strain:   . Difficulty of Paying Living Expenses:   Food Insecurity:   . Worried About Charity fundraiser in the Last Year:   . Arboriculturist in the Last Year:   Transportation Needs:   . Film/video editor (Medical):   Marland Kitchen Lack of Transportation (Non-Medical):   Physical Activity:   . Days of Exercise per Week:   . Minutes of Exercise per Session:   Stress:   . Feeling of Stress :   Social Connections:   . Frequency of Communication with Friends and Family:   . Frequency of Social Gatherings with Friends and Family:   . Attends Religious Services:   . Active Member of Clubs or Organizations:   . Attends Archivist Meetings:   Marland Kitchen Marital Status:     His Allergies Are:  No Known Allergies:   His Current Medications Are:  Outpatient Encounter Medications as of 10/03/2019  Medication Sig  . aspirin EC 81 MG tablet Take 81 mg by mouth daily.  . celecoxib (CELEBREX) 100 MG capsule Take 100 mg by mouth 2 (two) times daily.   . Coenzyme Q10 200 MG capsule Take 200 mg by mouth every  evening.  . desloratadine (CLARINEX) 5 MG tablet Take 5 mg by mouth daily.  Marland Kitchen docusate sodium (COLACE) 100 MG capsule Take 200 mg by mouth daily.  Marland Kitchen gabapentin (NEURONTIN) 300 MG capsule Take 600 mg by mouth every evening.   . Multiple Vitamin (MULTIVITAMIN WITH MINERALS) TABS tablet Take 1 tablet by mouth daily. Centrum Silver  . olmesartan (BENICAR) 40 MG tablet Take 40 mg by mouth daily.  . Omega-3 Fatty Acids (FISH OIL) 1200 MG CAPS Take 1,200-2,400 mg by mouth See admin instructions. 2400 mg in the morning & 1200 mg in the evening.   . pantoprazole (PROTONIX) 20 MG tablet Take 20 mg by mouth daily.  . Polyethylene Glycol 400 (BLINK TEARS) 0.25 % SOLN Place 1 drop into both eyes daily as needed (for dry eyes).  . pravastatin (PRAVACHOL) 20 MG tablet Take 20 mg by mouth daily.  .  traZODone (DESYREL) 50 MG tablet Take 1 tablet (50 mg total) by mouth at bedtime as needed for sleep. (Patient taking differently: Take 50 mg by mouth every evening. )  . [DISCONTINUED] dexlansoprazole (DEXILANT) 60 MG capsule Take 1 capsule (60 mg total) by mouth daily.   No facility-administered encounter medications on file as of 10/03/2019.  :  Review of Systems:  Out of a complete 14 point review of systems, all are reviewed and negative with the exception of these symptoms as listed below:  Review of Systems  Neurological:       Pt presents today to discuss his bipap. He did not bring his bipap with him today but will ask his DME to fax the report to [place]. Pt is interested in a new bipap.    Objective:  Neurological Exam  Physical Exam Physical Examination:   Vitals:   10/03/19 1405  BP: (!) 145/80  Pulse: 73    General Examination: The patient is a very pleasant 74 y.o. male in no acute distress. He appears well-developed and well-nourished and very well groomed.   HEENT:Normocephalic, atraumatic, pupils are equal, round and reactive to light, extraocular tracking is good without limitation to gaze excursion or nystagmus noted. Normal smooth pursuit is noted. Hearing is Mildly impaired, he has bilateral hearing aids in place.Face is symmetric with normal facial animation, speech is clear with no dysarthria noted. There is no hypophonia. There is no lip, neck/head, jaw or voice tremor. Neck is supple with full range of passive and active motion. There are no carotid bruits on auscultation. Oropharynx exam reveals:mildmouth dryness, adequatedental hygiene and moderateairway crowding.   Chest:Clear to auscultation without wheezing, rhonchi or crackles noted.  Heart:S1+S2+0, regular and normal without murmurs, rubs or gallops noted.   Abdomen:Soft, non-tender and non-distended.   Extremities:There isnopitting edema in the distal lower extremities bilaterally.    Skin: Warm and dry without trophic changes noted.  Musculoskeletal: exam revealssome bilateral knee discomfort. Range of motion is good. Reports lower back stiffness.  Neurologically:  Mental status: The patient is awake, alert and oriented in all 4 spheres.Hisimmediate and remote memory, attention, language skills and fund of knowledge are fairly good. There is no evidence of aphasia, agnosia, apraxia or anomia. Speech is clear with normal prosody and enunciation. Thought process is linear. Mood isnormaland affect is normal.  On 03/08/2015: MOCA 26/30. On3/29/2018: MMSE: 29/30, CDT: 4/4, AFT: 18/min.  Cranial nerves II - XII are as described above under HEENT exam. Motor exam: Normal bulk, strength and tone is noted. There is no drift, tremor or rebound. Romberg is not  tested for safety. Fine motor skills and coordination: intact.  Cerebellar testing: No dysmetria or intention tremor. There is no truncal or gait ataxia.  Sensory exam: intact to light touch in the upper and lower extremities.  Gait, station and balance:Hestandswith mild difficulty, he walks with no obvious limp.  Assessmentand Plan:  In summary,Marinus D Oakesis a very pleasant 82 year oldmalewith an underlying medical history of hearing loss, cognitive complaints, restless leg syndrome, history of neuropathy, arthritis of both knees, with s/p R TKA, diverticulitis and obesity, who presents for follow-up consultation of his obstructive sleep apnea. He has been on BiPAP therapy, at a pressure of 12/8 cm and reports ongoing full compliance and ongoing good results. He had neuropsychological testing in December 2016 with reassuring findings. From the sleep apnea standpoint, he is doing well.  He should be eligible for a new machine.  His DME company is in [place] and I explained to him that I would like to review his current download for the past month, he can take his machine to his DME office in [place] and  they can fax the report to me.  He should then be able to get a new BiPAP machine.  He is advised to schedule a follow-up appointment in about 3 months after starting his new equipment.  After that he can be seen on a yearly basis.  He is advised to go ahead and make his yearly checkup appointment just in case.  He is advised to call our office in the next 2 weeks or so if he has not heard from [place] until then. I answered all his questions today and the patient was in agreement.

## 2019-10-03 NOTE — Patient Instructions (Addendum)
As discussed, you should be eligible for a new machine. Please have your DME company download your current BiPAP machine so I can review your download and make a note in your chart.  I will write for a new machine afterwards and once you start using your new machine you will have to have a follow-up appointment within 3 months of starting the new equipment.  Please schedule your 1 year routine follow-up for now and we will adjust and add another appointment in between once we are ready to prescribe a new machine, hopefully you do not need a new sleep study.

## 2019-10-06 ENCOUNTER — Telehealth: Payer: Self-pay

## 2019-10-06 DIAGNOSIS — G4733 Obstructive sleep apnea (adult) (pediatric): Secondary | ICD-10-CM

## 2019-10-06 NOTE — Telephone Encounter (Signed)
I called pt. I discussed this with him. Will send order for new bipap to [facility]. A follow up appt was made per insurance requirements on 01/19/20 at 3:30pm. Pt verbalized understanding of new appt date and time and of recommendations. Order for new bipap sent to [facility] via fax. Confirmation received that the order transmitted was successful.

## 2019-10-06 NOTE — Telephone Encounter (Signed)
I reviewed the patient's BiPAP compliance data from 09/06/2019 through 4/142021, which is a total of 30 days, during which time he used his machine every night with percent use days greater than 4 hours at 100%, indicating superb compliance with an average usage of 8 hours and 56 minutes, residual AHI at goal at 1/h, leak very low, pressure of 12/8 centimeters.  Patient does not be fully compliant.  He should be eligible for a new BiPAP machine.  I have entered an order for a BiPAP machine.  Please fax to his DME company.  As discussed, he will need a follow-up appointment after less than 3 months of set up date.

## 2019-10-06 NOTE — Telephone Encounter (Signed)
Received bipap download from [facility] as requested by Dr. Rexene Alberts.

## 2019-12-13 ENCOUNTER — Telehealth: Payer: Self-pay | Admitting: *Deleted

## 2019-12-13 DIAGNOSIS — G4733 Obstructive sleep apnea (adult) (pediatric): Secondary | ICD-10-CM

## 2019-12-13 NOTE — Telephone Encounter (Signed)
Pt called in regards to a letter he received from his DME company about a recall on his bipap.  Pt states he has used cleaner with the ozone included  But denies any heat exposure.  Pt wanted to know if Dr. Rexene Alberts felt he should stop using his bipap machine and restart his old machine while his DME is trying to figure this recall out for him?  Pt was advised I would check with Dr. Rexene Alberts and call him back tomorrow.  Pt was agreeable.

## 2019-12-13 NOTE — Telephone Encounter (Signed)
Pt called today at 2:30 pm and LVM stating he has a new machine that has been recalled and that whomever he spoke with told him to use his old machine for now. He would like a call back to let him know what we feel he needs to do. Call back [phone].

## 2019-12-13 NOTE — Telephone Encounter (Signed)
The way I understood this issue is that he should be safe to continue his machine but he may qualify for a special filter that the DME company should be able to provide.  The filter should be able to block any contaminants or breakdown products.  I would also recommend no longer using the ozone cleaner machine for now.  I do not believe he has to stop using his new machine but I would recommend he get in touch with his DME company regarding a filter he might need.

## 2019-12-14 NOTE — Addendum Note (Signed)
Addended by: Verlin Grills on: 12/14/2019 09:02 AM   Modules accepted: Orders

## 2019-12-14 NOTE — Telephone Encounter (Signed)
I contacted the pt and we discussed this message.  Pt verbalized understanding but order for this filter would need to be sent to DME for filter to be attached.  I have formulated the order and sent to the [facility] care in [place].

## 2020-01-19 ENCOUNTER — Ambulatory Visit: Payer: Self-pay | Admitting: Family Medicine

## 2020-03-16 ENCOUNTER — Telehealth: Payer: Self-pay | Admitting: Neurology

## 2020-03-16 NOTE — Telephone Encounter (Signed)
Pt called, there is a recall on the CPAP machine I have.. Called DME company, [place] suggested call your insurance company, [place] suggested to have physician call with PA for a new CPAP machine. Would like a call from the nurse.

## 2020-03-19 NOTE — Telephone Encounter (Signed)
Pt has called back and the message from [facility] was relayed.  Pt states he has spoken with [employer] at [phone].  Pt states they told him to contact his insurance company.  Pt states his insurance company told him to call here and ask Dr Rexene Alberts to authorize a new one for him.  Pt was given the message from [facility] that His machine is not 72 years old so we cannot authorize a new machine...  Pt states he has 2 CPAP's and the older of the 2 is over 5, pt states he does not understand why Dr Rexene Alberts feels she is unable to authorize a new CPAP for him.  Pt states DME is telling him it could be over 12 months for a replacement.  Please call.

## 2020-03-19 NOTE — Telephone Encounter (Signed)
If he would like a new Rx for a BiPAP, I can put another order in, but it would not be covered through his insurance as he just got a new machine. As far as the recall, we have really no other choice or recommendation, at least he has not used a ozone cleaner on the new machine and he would not have exposed his machine to high heat.  We are recommending that patients continue treating their sleep apnea with the current machines until the backlog is taken care of and the replacement machines are being distributed.  He is advised to continue close conversation with his DME company as far as updates on replacement machines.

## 2020-03-19 NOTE — Telephone Encounter (Signed)
I called pt. No answer, left a message asking pt to call me back.  When pt calls back please give him the [facility] # for the recall machine. At this point we have to wait for the machines to be distributed out from either the durable medical equipment company or [employer].   Contact [employer] at [phone].  We cannot supply him a new machine from our office.

## 2020-03-19 NOTE — Telephone Encounter (Signed)
I have called the pt. Pt and I discussed this message earlier in June. See message from 12/13/2019. Pt has still not been issued a new machine from the recall [employer] has put out. He sts his DME told him it could be up to a year before the machines are replaced.  Pt still answers no to having used the ozone/so cocleaner, being exposed to heat for long periods and seeing black particles in the water chamber.  Pt was advised at this time there is no contraindications for him to continue to use the machine we wrote for back in April . Pt wanted to know if we could just write a new machine order to replace the recalled machine? I advised since his current machine is not over 35 years old we could not write a new order.. Pt then sated he has a machine over 15 years old that he has been using recently and that he had used the ozone cleaner on this machine.. Pt was advised not to use his older machine and to continue with the new machine. I advised the pt this recall process has been frustrating/slow but he would need to work closely with his DME to get a replacement. Pt verbalized understanding.

## 2020-03-20 NOTE — Telephone Encounter (Signed)
I called the pt and advised of Dr. Guadelupe Sabin recommendations. He declines taking trying to buy the cpap out right. He will continue to use his machine he started on in April.

## 2020-04-12 ENCOUNTER — Ambulatory Visit: Payer: Self-pay | Admitting: Family Medicine

## 2020-10-02 ENCOUNTER — Ambulatory Visit: Payer: Medicare Other | Admitting: Family Medicine

## 2020-10-16 ENCOUNTER — Ambulatory Visit: Payer: Medicare Other | Admitting: Family Medicine

## 2021-01-07 ENCOUNTER — Telehealth: Payer: Self-pay

## 2021-01-07 NOTE — Telephone Encounter (Signed)
Called pt in reference to cpap f/u with Amy, NP on 01/08/21.  If pt is using cpap, he will need to bring it in (and power cord) for a manual download.

## 2021-01-08 ENCOUNTER — Encounter: Payer: Self-pay | Admitting: Family Medicine

## 2021-01-08 ENCOUNTER — Ambulatory Visit: Payer: Medicare Other | Admitting: Family Medicine

## 2021-01-08 VITALS — BP 107/61 | HR 70 | Ht 70.0 in | Wt 255.5 lb

## 2021-01-08 DIAGNOSIS — G4733 Obstructive sleep apnea (adult) (pediatric): Secondary | ICD-10-CM

## 2021-01-08 NOTE — Progress Notes (Addendum)
PATIENT: Dakota Thompson DOB: May 04, 1946  REASON FOR VISIT: follow up HISTORY FROM: patient  Chief Complaint  Patient presents with   Obstructive Sleep Apnea    Rm 10, alone. Yearly BiPaP f/u. Pt states he's been doing well. No issues or concern. Pt has both his old and new cpap machine on recall. Still waiting to hear back from DME.      HISTORY OF PRESENT ILLNESS: 01/08/21 ALL: Dakota Thompson is a 75 y.o. male here today for follow up for OSA on BiPAP. He continues to do well on his new machine. He has not received a replacement for recalled machine but denies any concerns. He is using BiPAP nightly.   Recent compliance data shows he used BiPAP 30/30 days for greater than 4 hours, compliance 100%. Residual AHI was 2 on 12/8cmH20. No significant leak was noted.    HISTORY: (copied from Dr Guadelupe Sabin previous notes)  Dakota Thompson is a 75 year old right-handed gentleman with an underlying medical history of neuropathy, obesity and restless leg syndrome and mild memory loss, Who presents for follow-up consultation of his obstructive sleep apnea, on treatment with BiPAP therapy.  The patient is unaccompanied today.  I last saw him on 09/30/2018 in a virtual visit.  He indicated that he was interested in a new machine.  He was advised to call back so we could prescribe a new machine through his DME company located in [place], [place].  He was otherwise advised to follow-up routinely in 1 year.   Today, 10/03/19: I was not able to review his BiPAP compliance data as he did not bring his machine. He reports compliance with his machine. He would be willing to take his machine to his DME for a download in the next 2-3 days. He would like to get a new machine. He has continued to benefit from treatment and is motivated to continue. He has received his Covid vaccine.    REVIEW OF SYSTEMS: Out of a complete 14 system review of symptoms, the patient complains only of the following symptoms, neuropathy,  fatigue and all other reviewed systems are negative.   ALLERGIES: No Known Allergies  HOME MEDICATIONS: Outpatient Medications Prior to Visit  Medication Sig Dispense Refill   aspirin EC 81 MG tablet Take 81 mg by mouth daily.     celecoxib (CELEBREX) 100 MG capsule Take 100 mg by mouth 2 (two) times daily.  3   Coenzyme Q10 200 MG capsule Take 200 mg by mouth every evening.     desloratadine (CLARINEX) 5 MG tablet Take 5 mg by mouth daily.     docusate sodium (COLACE) 100 MG capsule Take 200 mg by mouth daily.     gabapentin (NEURONTIN) 300 MG capsule Take 600 mg by mouth every evening.      gemfibrozil (LOPID) 600 MG tablet Take 600 mg by mouth 2 (two) times daily.     Multiple Vitamin (MULTIVITAMIN WITH MINERALS) TABS tablet Take 1 tablet by mouth daily. Centrum Silver     olmesartan (BENICAR) 40 MG tablet Take 40 mg by mouth daily.  3   Omega-3 Fatty Acids (FISH OIL) 1200 MG CAPS Take 1,200-2,400 mg by mouth See admin instructions. 2400 mg in the morning & 1200 mg in the evening.      pantoprazole (PROTONIX) 20 MG tablet Take 20 mg by mouth daily.     pravastatin (PRAVACHOL) 20 MG tablet Take 20 mg by mouth daily.     Probiotic Product (PROBIOTIC  DAILY PO) Take 1 tablet by mouth daily.     traZODone (DESYREL) 50 MG tablet Take 1 tablet (50 mg total) by mouth at bedtime as needed for sleep. (Patient taking differently: Take 50 mg by mouth every evening.) 30 tablet 0   Polyethylene Glycol 400 0.25 % SOLN Place 1 drop into both eyes daily as needed (for dry eyes).     No facility-administered medications prior to visit.    PAST MEDICAL HISTORY: Past Medical History:  Diagnosis Date   Anemia    Anxiety    Cancer ([place])    Skin   Charcot-Marie-Tooth disease    hx, ?not confirmed by neurology most recently, neuropathy   GERD (gastroesophageal reflux disease)    Hx of adenomatous colonic polyps    tubulovillous adenoma   Hypertension    Pancytopenia    mild   Pancytopenia  02/27/2011   Restless leg syndrome    Sleep apnea    wears CPAP/BIPAP every night   Thyroid disease    Viral syndrome    Hx   Viral syndrome 02/27/2011    PAST SURGICAL HISTORY: Past Surgical History:  Procedure Laterality Date   BIOPSY  12/31/2017   Procedure: BIOPSY;  Surgeon: Daneil Dolin, MD;  Location: [facility];  Service: Endoscopy;;  gastric biopsy    COLONOSCOPY  01/02/2010   RMR: normal rectum and colon. marginal prep compromised exam.    COLONOSCOPY N/A 03/15/2015   Dr. Gala Romney: tubular adenoma, colonic diverticulosis, surveillance due 2021   COLONOSCOPY WITH PROPOFOL N/A 12/31/2017   Procedure: COLONOSCOPY WITH PROPOFOL;  Surgeon: Daneil Dolin, MD;  Location: [facility];  Service: Endoscopy;  Laterality: N/A;  2:00pm   ESOPHAGOGASTRODUODENOSCOPY N/A 03/15/2015   2 cm hiatal hernia, otherwise normal   ESOPHAGOGASTRODUODENOSCOPY (EGD) WITH PROPOFOL N/A 12/31/2017   Procedure: ESOPHAGOGASTRODUODENOSCOPY (EGD) WITH PROPOFOL;  Surgeon: Daneil Dolin, MD;  Location: [facility];  Service: Endoscopy;  Laterality: N/A;   EYE SURGERY     bilateral cataracts   GANGLION CYST EXCISION Right    knee cartilage repair Bilateral 01/31/2011   NASAL SINUS SURGERY     removal skin cancer      nose   TOTAL KNEE ARTHROPLASTY Right 10/03/2016   Procedure: RIGHT TOTAL KNEE ARTHROPLASTY;  Surgeon: Sydnee Cabal, MD;  Location: [facility];  Service: Orthopedics;  Laterality: Right;    FAMILY HISTORY: Family History  Problem Relation Age of Onset   Colon cancer Brother        older than 16    Colon cancer Sister        older than 8     SOCIAL HISTORY: Social History   Socioeconomic History   Marital status: Married    Spouse name: Production manager    Number of children: 2   Years of education: Not on file   Highest education level: Not on file  Occupational History   Occupation: Retired    Fish farm manager: RETIRED  Tobacco Use   Smoking status: Former    Packs/day: 3.00    Years: 28.00     Pack years: 84.00    Types: Cigarettes    Quit date: 05/22/1981    Years since quitting: 39.6   Smokeless tobacco: Never  Vaping Use   Vaping Use: Never used  Substance and Sexual Activity   Alcohol use: Yes    Comment: rare   Drug use: No   Sexual activity: Yes  Other Topics Concern   Not on  file  Social History Narrative   Patient lives at home with his wife.    Patient has 2 children.    Patient is retired.    Social Determinants of Health   Financial Resource Strain: Not on file  Food Insecurity: Not on file  Transportation Needs: Not on file  Physical Activity: Not on file  Stress: Not on file  Social Connections: Not on file  Intimate Partner Violence: Not on file     PHYSICAL EXAM  Vitals:   01/08/21 1315  BP: 107/61  Pulse: 70  Weight: 255 lb 8 oz (115.9 kg)  Height: 5\' 10"  (1.778 m)   Body mass index is 36.66 kg/m.  Generalized: Well developed, in no acute distress  Cardiology: normal rate and rhythm, no murmur noted Respiratory: clear to auscultation bilaterally  Neurological examination  Mentation: Alert oriented to time, place, history taking. Follows all commands speech and language fluent Cranial nerve II-XII: Pupils were equal round reactive to light. Extraocular movements were full, visual field were full  Motor: The motor testing reveals 5 over 5 strength of all 4 extremities. Good symmetric motor tone is noted throughout.  Gait and station: Gait is normal.    DIAGNOSTIC DATA (LABS, IMAGING, TESTING) - I reviewed patient records, labs, notes, testing and imaging myself where available.  MMSE - Mini Mental State Exam 09/18/2016  Orientation to time 5  Orientation to Place 5  Registration 3  Attention/ Calculation 5  Recall 2  Language- name 2 objects 2  Language- repeat 1  Language- follow 3 step command 3  Language- read & follow direction 1  Write a sentence 1  Copy design 1  Total score 29     Lab Results  Component Value  Date   WBC 4.2 12/25/2017   HGB 15.3 12/25/2017   HCT 45.3 12/25/2017   MCV 88.6 12/25/2017   PLT 153 12/25/2017      Component Value Date/Time   NA 140 12/25/2017 1417   K 4.1 12/25/2017 1417   CL 107 12/25/2017 1417   CO2 23 12/25/2017 1417   GLUCOSE 131 (H) 12/25/2017 1417   BUN 20 12/25/2017 1417   CREATININE 1.20 12/25/2017 1417   CALCIUM 8.9 12/25/2017 1417   PROT 7.7 03/06/2017 1314   ALBUMIN 4.0 03/06/2017 1314   AST 43 (H) 03/06/2017 1314   ALT 25 03/06/2017 1314   ALKPHOS 42 03/06/2017 1314   BILITOT 0.5 03/06/2017 1314   GFRNONAA 60 (L) 12/25/2017 1417   GFRAA >60 12/25/2017 1417   No results found for: CHOL, HDL, LDLCALC, LDLDIRECT, TRIG, CHOLHDL No results found for: HGBA1C Lab Results  Component Value Date   VITAMINB12 >1999 (H) 02/24/2014   Lab Results  Component Value Date   TSH 1.590 02/24/2014     ASSESSMENT AND PLAN 75 y.o. year old male  has a past medical history of Anemia, Anxiety, Cancer ([place]), Charcot-Marie-Tooth disease, GERD (gastroesophageal reflux disease), adenomatous colonic polyps, Hypertension, Pancytopenia, Pancytopenia (02/27/2011), Restless leg syndrome, Sleep apnea, Thyroid disease, Viral syndrome, and Viral syndrome (02/27/2011). here with     ICD-10-CM   1. OSA treated with BiPAP  G47.33 For home use only DME Bipap       CAILEN MIHALIK is doing well on BiPAP therapy. Compliance report reveals excellent. He was encouraged to continue using BiPAP nightly and for greater than 4 hours each night. We will update supply orders as indicated. Risks of untreated sleep apnea review and education materials provided. Healthy  lifestyle habits encouraged. He will follow up in 1 years, sooner if needed. He verbalizes understanding and agreement with this plan.    Orders Placed This Encounter  Procedures   For home use only DME Bipap    Supplies    Order Specific Question:   Length of Need    Answer:   Lifetime    Order Specific Question:    Inspiratory pressure    Answer:   OTHER SEE COMMENTS    Order Specific Question:   Expiratory pressure    Answer:   OTHER SEE COMMENTS      No orders of the defined types were placed in this encounter.     Debbora Presto, FNP-C 01/08/2021, 3:05 PM [facility] [address], [place], [place] [address] [phone]  I reviewed the above note and documentation by the Nurse Practitioner and agree with the history, exam, assessment and plan as outlined above. I was available for consultation. Star Age, MD, PhD [facility])

## 2021-01-08 NOTE — Patient Instructions (Signed)
Please continue using your BiPAP regularly. While your insurance requires that you use BiPAP at least 4 hours each night on 70% of the nights, I recommend, that you not skip any nights and use it throughout the night if you can. Getting used to BiPAP and staying with the treatment long term does take time and patience and discipline. Untreated obstructive sleep apnea when it is moderate to severe can have an adverse impact on cardiovascular health and raise her risk for heart disease, arrhythmias, hypertension, congestive heart failure, stroke and diabetes. Untreated obstructive sleep apnea causes sleep disruption, nonrestorative sleep, and sleep deprivation. This can have an impact on your day to day functioning and cause daytime sleepiness and impairment of cognitive function, memory loss, mood disturbance, and problems focussing. Using BiPAP regularly can improve these symptoms.   Follow up in 1 year  

## 2021-05-02 ENCOUNTER — Other Ambulatory Visit (HOSPITAL_COMMUNITY): Payer: Self-pay | Admitting: Nephrology

## 2021-05-02 ENCOUNTER — Other Ambulatory Visit: Payer: Self-pay | Admitting: Nephrology

## 2021-05-02 DIAGNOSIS — E875 Hyperkalemia: Secondary | ICD-10-CM

## 2021-05-02 DIAGNOSIS — I129 Hypertensive chronic kidney disease with stage 1 through stage 4 chronic kidney disease, or unspecified chronic kidney disease: Secondary | ICD-10-CM

## 2021-05-09 ENCOUNTER — Other Ambulatory Visit: Payer: Self-pay

## 2021-05-09 ENCOUNTER — Ambulatory Visit (HOSPITAL_COMMUNITY)
Admission: RE | Admit: 2021-05-09 | Discharge: 2021-05-09 | Disposition: A | Discharge status: Home / Self Care | Payer: Medicare Other | Source: Ambulatory Visit | Attending: Nephrology | Admitting: Nephrology

## 2021-05-09 DIAGNOSIS — I129 Hypertensive chronic kidney disease with stage 1 through stage 4 chronic kidney disease, or unspecified chronic kidney disease: Secondary | ICD-10-CM | POA: Insufficient documentation

## 2021-05-09 DIAGNOSIS — E875 Hyperkalemia: Secondary | ICD-10-CM | POA: Insufficient documentation

## 2021-05-27 ENCOUNTER — Emergency Department (HOSPITAL_COMMUNITY)
Admission: EM | Admit: 2021-05-27 | Discharge: 2021-05-27 | Disposition: A | Discharge status: Home / Self Care | Payer: Medicare Other | Attending: Emergency Medicine | Admitting: Emergency Medicine

## 2021-05-27 ENCOUNTER — Emergency Department (HOSPITAL_COMMUNITY): Payer: Medicare Other

## 2021-05-27 ENCOUNTER — Other Ambulatory Visit: Payer: Self-pay

## 2021-05-27 ENCOUNTER — Encounter (HOSPITAL_COMMUNITY): Payer: Self-pay | Admitting: *Deleted

## 2021-05-27 DIAGNOSIS — Z85828 Personal history of other malignant neoplasm of skin: Secondary | ICD-10-CM | POA: Diagnosis not present

## 2021-05-27 DIAGNOSIS — Z79899 Other long term (current) drug therapy: Secondary | ICD-10-CM | POA: Insufficient documentation

## 2021-05-27 DIAGNOSIS — Z20822 Contact with and (suspected) exposure to covid-19: Secondary | ICD-10-CM | POA: Diagnosis not present

## 2021-05-27 DIAGNOSIS — I129 Hypertensive chronic kidney disease with stage 1 through stage 4 chronic kidney disease, or unspecified chronic kidney disease: Secondary | ICD-10-CM | POA: Diagnosis not present

## 2021-05-27 DIAGNOSIS — N189 Chronic kidney disease, unspecified: Secondary | ICD-10-CM | POA: Insufficient documentation

## 2021-05-27 DIAGNOSIS — Z87891 Personal history of nicotine dependence: Secondary | ICD-10-CM | POA: Diagnosis not present

## 2021-05-27 DIAGNOSIS — J0110 Acute frontal sinusitis, unspecified: Secondary | ICD-10-CM

## 2021-05-27 DIAGNOSIS — Z7982 Long term (current) use of aspirin: Secondary | ICD-10-CM | POA: Insufficient documentation

## 2021-05-27 DIAGNOSIS — Z96651 Presence of right artificial knee joint: Secondary | ICD-10-CM | POA: Insufficient documentation

## 2021-05-27 DIAGNOSIS — R059 Cough, unspecified: Secondary | ICD-10-CM | POA: Diagnosis present

## 2021-05-27 LAB — COMPREHENSIVE METABOLIC PANEL
ALT: 16 U/L (ref 0–44)
AST: 19 U/L (ref 15–41)
Albumin: 3.7 g/dL (ref 3.5–5.0)
Alkaline Phosphatase: 34 U/L — ABNORMAL LOW (ref 38–126)
Anion gap: 10 (ref 5–15)
BUN: 35 mg/dL — ABNORMAL HIGH (ref 8–23)
CO2: 23 mmol/L (ref 22–32)
Calcium: 8.8 mg/dL — ABNORMAL LOW (ref 8.9–10.3)
Chloride: 102 mmol/L (ref 98–111)
Creatinine, Ser: 2.88 mg/dL — ABNORMAL HIGH (ref 0.61–1.24)
GFR, Estimated: 22 mL/min — ABNORMAL LOW (ref >60)
Glucose, Bld: 112 mg/dL — ABNORMAL HIGH (ref 70–99)
Potassium: 3.9 mmol/L (ref 3.5–5.1)
Sodium: 135 mmol/L (ref 135–145)
Total Bilirubin: 0.8 mg/dL (ref 0.3–1.2)
Total Protein: 7.7 g/dL (ref 6.5–8.1)

## 2021-05-27 LAB — CBC WITH DIFFERENTIAL/PLATELET
Abs Immature Granulocytes: 0.03 10*3/uL (ref 0.00–0.07)
Basophils Absolute: 0.1 10*3/uL (ref 0.0–0.1)
Basophils Relative: 1 %
Eosinophils Absolute: 0 10*3/uL (ref 0.0–0.5)
Eosinophils Relative: 0 %
HCT: 36.5 % — ABNORMAL LOW (ref 39.0–52.0)
Hemoglobin: 12.4 g/dL — ABNORMAL LOW (ref 13.0–17.0)
Immature Granulocytes: 0 %
Lymphocytes Relative: 12 %
Lymphs Abs: 1.2 10*3/uL (ref 0.7–4.0)
MCH: 30.6 pg (ref 26.0–34.0)
MCHC: 34 g/dL (ref 30.0–36.0)
MCV: 90.1 fL (ref 80.0–100.0)
Monocytes Absolute: 1.3 10*3/uL — ABNORMAL HIGH (ref 0.1–1.0)
Monocytes Relative: 14 %
Neutro Abs: 6.9 10*3/uL (ref 1.7–7.7)
Neutrophils Relative %: 73 %
Platelets: 233 10*3/uL (ref 150–400)
RBC: 4.05 MIL/uL — ABNORMAL LOW (ref 4.22–5.81)
RDW: 13.4 % (ref 11.5–15.5)
WBC: 9.4 10*3/uL (ref 4.0–10.5)
nRBC: 0 % (ref 0.0–0.2)

## 2021-05-27 LAB — RESP PANEL BY RT-PCR (FLU A&B, COVID) ARPGX2
Influenza A by PCR: NEGATIVE
Influenza B by PCR: NEGATIVE
SARS Coronavirus 2 by RT PCR: NEGATIVE

## 2021-05-27 MED ORDER — SODIUM CHLORIDE 0.9 % IV SOLN
1.0000 g | Freq: Once | INTRAVENOUS | Status: AC
  Administered 2021-05-27: 1 g via INTRAVENOUS
  Filled 2021-05-27: qty 10

## 2021-05-27 MED ORDER — AMOXICILLIN-POT CLAVULANATE 875-125 MG PO TABS: 1.0000 | ORAL_TABLET | Freq: Two times a day (BID) | ORAL | 0 refills | Status: DC

## 2021-05-27 MED ORDER — SODIUM CHLORIDE 0.9 % IV BOLUS
500.0000 mL | Freq: Once | INTRAVENOUS | Status: AC
  Administered 2021-05-27: 500 mL via INTRAVENOUS

## 2021-05-27 NOTE — ED Provider Notes (Signed)
  Face-to-face evaluation   History: He presents for evaluation of headache, blurred vision and general weakness.  He was sent here for further evaluation after getting negative flu test in the office.  He has had some sinus congestion and blowing his nose and producing brown mucus.  He denies fever, chills, chest pain, focal weakness or paresthesia.  He has a history of asthma.  He is not a smoker.  I chatted with his wife Freda Munro by telephone.  She states that his GFR is usually "43."  Physical exam: Elderly, alert no apparent distress.  Lungs with fair air movement without wheezes rales or rhonchi.  No dysarthria or aphasia.  Medical screening examination/treatment/procedure(s) were conducted as a shared visit with non-physician practitioner(s) and myself.  I personally evaluated the patient during the encounter    Daleen Bo, MD 05/28/21 1231

## 2021-05-27 NOTE — ED Provider Notes (Signed)
[facility] EMERGENCY DEPARTMENT Provider Note   CSN: [id number] Arrival date & time: 05/27/21  1155     History Chief Complaint  Patient presents with   Cough    Dakota Thompson is a 75 y.o. male with a history including hypertension, GERD, sleep apnea, and chronic kidney disease under the care of Dr. Lowanda Foster, presenting with multiple complaints including a nonproductive cough, headache including sinus pain and thick, dark brown nasal discharge along with generalized weakness.  He was seen by his PCP today and states he tested negative for the flu but was sent here for further evaluation of his symptoms.  He denies shortness of breath, chest pain, abdominal pain nausea or vomiting.  He does endorse feeling lightheaded with positional changes.  He has had reduced p.o. intake over the past several days.  No documented fevers, denies dysuria or reduced urinary frequency.  He is under the care of Dr. Theador Hawthorne who is closely watching his kidney function, stating he started to have problems with an elevated potassium level.  He has had no treatments prior to arrival for his symptoms.  The history is provided by the patient.      Past Medical History:  Diagnosis Date   Anemia    Anxiety    Cancer ([place])    Skin   Charcot-Marie-Tooth disease    hx, ?not confirmed by neurology most recently, neuropathy   GERD (gastroesophageal reflux disease)    Hx of adenomatous colonic polyps    tubulovillous adenoma   Hypertension    Pancytopenia    mild   Pancytopenia 02/27/2011   Restless leg syndrome    Sleep apnea    wears CPAP/BIPAP every night   Thyroid disease    Viral syndrome    Hx   Viral syndrome 02/27/2011    Patient Active Problem List   Diagnosis Date Noted   Diverticulitis 01/15/2017   HTN (hypertension) 01/15/2017   HLD (hyperlipidemia) 01/15/2017   Perforated diverticulum    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   History of colonic polyps    Diverticulosis of colon without  hemorrhage    Hiatal hernia    Hx of adenomatous colonic polyps 01/11/2015   GERD (gastroesophageal reflux disease) 01/11/2015   Constipation 01/11/2015   Restless leg syndrome 01/07/2013   Unspecified hereditary and idiopathic peripheral neuropathy 01/07/2013   Mild cognitive impairment 01/07/2013   Pancytopenia 02/27/2011   Viral syndrome 02/27/2011    Past Surgical History:  Procedure Laterality Date   BIOPSY  12/31/2017   Procedure: BIOPSY;  Surgeon: Daneil Dolin, MD;  [facility];  Service: Endoscopy;;  gastric biopsy    COLONOSCOPY  01/02/2010   RMR: normal rectum and colon. marginal prep compromised exam.    COLONOSCOPY N/A 03/15/2015   Dr. Gala Romney: tubular adenoma, colonic diverticulosis, surveillance due 2021   COLONOSCOPY WITH PROPOFOL N/A 12/31/2017   Procedure: COLONOSCOPY WITH PROPOFOL;  Surgeon: Daneil Dolin, MD;  [facility]: [facility];  Service: Endoscopy;  Laterality: N/A;  2:00pm   ESOPHAGOGASTRODUODENOSCOPY N/A 03/15/2015   2 cm hiatal hernia, otherwise normal   ESOPHAGOGASTRODUODENOSCOPY (EGD) WITH PROPOFOL N/A 12/31/2017   Procedure: ESOPHAGOGASTRODUODENOSCOPY (EGD) WITH PROPOFOL;  Surgeon: Daneil Dolin, MD;  Location: [facility];  Service: Endoscopy;  Laterality: N/A;   EYE SURGERY     bilateral cataracts   GANGLION CYST EXCISION Right    knee cartilage repair Bilateral 01/31/2011   NASAL SINUS SURGERY     removal skin cancer  nose   TOTAL KNEE ARTHROPLASTY Right 10/03/2016   Procedure: RIGHT TOTAL KNEE ARTHROPLASTY;  Surgeon: Sydnee Cabal, MD;  Location: [facility];  Service: Orthopedics;  Laterality: Right;       Family History  Problem Relation Age of Onset   Colon cancer Brother        older than 61    Colon cancer Sister        older than 7     Social History   Tobacco Use   Smoking status: Former    Packs/day: 3.00    Years: 28.00    Pack years: 84.00    Types: Cigarettes    Quit date: 05/22/1981    Years since  quitting: 40.0   Smokeless tobacco: Never  Vaping Use   Vaping Use: Never used  Substance Use Topics   Alcohol use: Yes    Comment: rare   Drug use: No    Home Medications Prior to Admission medications   Medication Sig Start Date End Date Taking? Authorizing Provider  aspirin EC 81 MG tablet Take 81 mg by mouth daily.    [provider]  celecoxib (CELEBREX) 100 MG capsule Take 100 mg by mouth 2 (two) times daily. 10/17/16   [provider]  Coenzyme Q10 200 MG capsule Take 200 mg by mouth every evening.    [provider]  desloratadine (CLARINEX) 5 MG tablet Take 5 mg by mouth daily.    [provider]  docusate sodium (COLACE) 100 MG capsule Take 200 mg by mouth daily.    [provider]  gabapentin (NEURONTIN) 300 MG capsule Take 600 mg by mouth every evening.     [provider]  gemfibrozil (LOPID) 600 MG tablet Take 600 mg by mouth 2 (two) times daily. 11/12/20   [provider]  Multiple Vitamin (MULTIVITAMIN WITH MINERALS) TABS tablet Take 1 tablet by mouth daily. Centrum Silver    [provider]  olmesartan (BENICAR) 40 MG tablet Take 40 mg by mouth daily. 07/26/15   [provider]  Omega-3 Fatty Acids (FISH OIL) 1200 MG CAPS Take 1,200-2,400 mg by mouth See admin instructions. 2400 mg in the morning & 1200 mg in the evening.     [provider]  pantoprazole (PROTONIX) 20 MG tablet Take 20 mg by mouth daily.    [provider]  pravastatin (PRAVACHOL) 20 MG tablet Take 20 mg by mouth daily.    [provider]  Probiotic Product (PROBIOTIC DAILY PO) Take 1 tablet by mouth daily.    [provider]  traZODone (DESYREL) 50 MG tablet Take 1 tablet (50 mg total) by mouth at bedtime as needed for sleep. Patient taking differently: Take 50 mg by mouth every evening. 01/20/17   Kathie Dike, MD    Allergies    Patient has no known allergies.  Review of Systems    Review of Systems  Constitutional:  Positive for fatigue. Negative for fever.  HENT:  Positive for congestion, sinus pressure and sinus pain. Negative for sore throat.   Eyes: Negative.   Respiratory:  Positive for cough. Negative for chest tightness, shortness of breath and wheezing.   Cardiovascular:  Negative for chest pain, palpitations and leg swelling.  Gastrointestinal:  Negative for abdominal pain, nausea and vomiting.  Genitourinary: Negative.   Musculoskeletal:  Negative for arthralgias, joint swelling and neck pain.  Skin: Negative.  Negative for rash and wound.  Neurological:  Positive for weakness and headaches. Negative  for dizziness, light-headedness and numbness.  Psychiatric/Behavioral: Negative.     Physical Exam Updated Vital Signs BP 106/70   Pulse 76   Temp 99.1 F (37.3 C)   Resp 18   Ht 5\' 10"  (1.778 m)   Wt 113.4 kg   SpO2 96%   BMI 35.87 kg/m   Physical Exam Vitals and nursing note reviewed.  Constitutional:      Appearance: He is well-developed.  HENT:     Head: Normocephalic and atraumatic.     Nose: Congestion present.     Right Sinus: Frontal sinus tenderness present.     Left Sinus: Frontal sinus tenderness present.     Mouth/Throat:     Mouth: Mucous membranes are dry.     Pharynx: Oropharynx is clear. No pharyngeal swelling or posterior oropharyngeal erythema.  Eyes:     Conjunctiva/sclera: Conjunctivae normal.  Cardiovascular:     Rate and Rhythm: Normal rate and regular rhythm.     Heart sounds: Normal heart sounds.  Pulmonary:     Effort: Pulmonary effort is normal.     Breath sounds: Normal breath sounds. No wheezing, rhonchi or rales.  Abdominal:     General: Bowel sounds are normal.     Palpations: Abdomen is soft.     Tenderness: There is no abdominal tenderness. There is no guarding.  Musculoskeletal:        General: Normal range of motion.     Cervical back: Normal range of motion.     Right lower leg: No edema.     Left  lower leg: No edema.  Skin:    General: Skin is warm and dry.  Neurological:     General: No focal deficit present.     Mental Status: He is alert.    ED Results / Procedures / Treatments   Labs (all labs ordered are listed, but only abnormal results are displayed) Labs Reviewed  CBC WITH DIFFERENTIAL/PLATELET - Abnormal; Notable for the following components:      Result Value   RBC 4.05 (*)    Hemoglobin 12.4 (*)    HCT 36.5 (*)    Monocytes Absolute 1.3 (*)    All other components within normal limits  COMPREHENSIVE METABOLIC PANEL - Abnormal; Notable for the following components:   Glucose, Bld 112 (*)    BUN 35 (*)    Creatinine, Ser 2.88 (*)    Calcium 8.8 (*)    Alkaline Phosphatase 34 (*)    GFR, Estimated 22 (*)    All other components within normal limits  RESP PANEL BY RT-PCR (FLU A&B, COVID) [id number]    EKG EKG Interpretation  Date/Time:  Monday May 27 2021 12:54:36 EST Ventricular Rate:  84 PR Interval:  182 QRS Duration: 80 QT Interval:  356 QTC Calculation: 420 R Axis:   73 Text Interpretation: Normal sinus rhythm Normal ECG No significant change since 3/18 Confirmed by Aletta Edouard [id number]) on 05/27/2021 1:02:21 PM  Radiology DG Chest 2 View  Result Date: 05/27/2021 CLINICAL DATA:  Cough and weakness for 5 days. Headache. Ex-smoker. EXAM: CHEST - 2 VIEW COMPARISON:  09/09/2017 FINDINGS: Mild hyperinflation. Midline trachea. Normal heart size. Atherosclerosis in the transverse aorta. No pleural effusion or pneumothorax. Diffuse peribronchial thickening. Clear lungs. IMPRESSION: No acute cardiopulmonary disease. 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 Electronically Signed   By: Abigail Miyamoto M.D.   On: 05/27/2021 13:23    Procedures Procedures   Medications Ordered in ED  Medications  sodium chloride 0.9 % bolus 500 mL (0 mLs Intravenous Stopped 05/27/21 1937)  cefTRIAXone (ROCEPHIN) 1 g in sodium  chloride 0.9 % 100 mL IVPB (0 g Intravenous Stopped 05/27/21 1937)  sodium chloride 0.9 % bolus 500 mL (0 mLs Intravenous Stopped 05/27/21 1937)    ED Course  I have reviewed the triage vital signs and the nursing notes.  Pertinent labs & imaging results that were available during my care of the patient were reviewed by me and considered in my medical decision making (see chart for details).    MDM Rules/Calculators/A&P                           Patient's respiratory panel is negative as is his chest x-ray.  He does have an elevated BUN at 35 and a creatinine of 2.88.  I do not know if this creatinine level is a stable finding, clinically though he does appear dehydrated.  His last creatinine that I could check is 1.2, but this was 3 years ago.  A call was placed to Dr. Lowanda Foster to try to obtain more recent kidney function testing, unable to obtain this info.   In the interim he has been given a gentle 500 cc bolus of normal saline.  With other testing normal including his respiratory panel, he was given a dose of Rocephin per IV to start his treatment for suspected acute sinusitis.  He will be placed on Augmentin.  He was advised close follow-up with Dr. Theador Hawthorne to advise of his creatinine level today, which may very well be baseline for him.  His potassium is normal so is reassuring.  Orthostatics are negative.  Advise close follow-up per above.  Patient was advised to stop taking his Celebrex which he states he already has stopped taking this medication about 1 week ago.  He was also advised to hold his Benicar until he has discussed this medication with his nephrologist.  Labs are otherwise reassuring, his potassium level is 3.9.  His respiratory panel is negative and he has a normal WBC count at 9.4.  Final Clinical Impression(s) / ED Diagnoses Final diagnoses:  Acute frontal sinusitis, recurrence not specified    Rx / DC Orders ED Discharge Orders     None        Landis Martins 05/27/21 Wyn Quaker, MD 05/28/21 1231

## 2021-05-27 NOTE — Discharge Instructions (Signed)
Continue to not take your Celebrex as this is hard on kidney function.  Additionally we recommend holding your Benicar (olmesartan) until he discusses medication with Dr. Theador Hawthorne.  Your creatinine level today is 2.88, which may be close to your baseline, although we do not have recent labs in order to compare today's results.  Call his office tomorrow so he can advise you further.  You have been prescribed Augmentin to help you with your suspected sinus infection.  Take your first dose tomorrow morning as you have received an IV dose of similar medication here.  Saline nasal spray, steam therapy and Flonase nasal spray (no prescription needed) may also help with your nasal congestion.

## 2021-05-27 NOTE — ED Triage Notes (Signed)
Pt c/o cough x 4 or 5 days.  Reports headache, blurred vision, generalized weakness.  Says saw pcp today and tested neg for flu.  Says was sent here for further eval.

## 2021-05-27 NOTE — ED Notes (Signed)
Pt provided water and crackers for PO challenge  

## 2022-01-08 ENCOUNTER — Ambulatory Visit: Payer: Medicare Other | Admitting: Family Medicine

## 2022-01-08 ENCOUNTER — Encounter: Payer: Self-pay | Admitting: Family Medicine

## 2022-01-08 VITALS — BP 119/80 | HR 80 | Ht 69.0 in | Wt 245.0 lb

## 2022-01-08 DIAGNOSIS — G4733 Obstructive sleep apnea (adult) (pediatric): Secondary | ICD-10-CM

## 2022-01-08 NOTE — Progress Notes (Signed)
PATIENT: Dakota Thompson DOB: 1945-08-09  REASON FOR VISIT: follow up HISTORY FROM: patient  Chief Complaint  Patient presents with   Follow-up    Pt with wife, rm 16. Presents today for bipap follow up. Overall stable and doing well. PVX:[id number] in [place]:  01/08/22 ALL: Dakota returns for follow up for OSA on BIPAP. He continues to do well on therapy. He received replacement machine from [employer] a few months ago. No concerns with machine or supplies. He is sleeping well. He is following with nephrology for CKD. BP meds have been discontinued for lower BP. He does report feeling a little more tired, recently. No exercise. Blood sugars seem stable.     01/08/2021 ALL:  Dakota Thompson is a 76 y.o. male here today for follow up for OSA on BiPAP. He continues to do well on his new machine. He has not received a replacement for recalled machine but denies any concerns. He is using BiPAP nightly.   Recent compliance data shows he used BiPAP 30/30 days for greater than 4 hours, compliance 100%. Residual AHI was 2 on 12/8cmH20. No significant leak was noted.    HISTORY: (copied from Dr Guadelupe Sabin previous notes)  Dakota Thompson is a 76 year old right-handed gentleman with an underlying medical history of neuropathy, obesity and restless leg syndrome and mild memory loss, Who presents for follow-up consultation of his obstructive sleep apnea, on treatment with BiPAP therapy.  The patient is unaccompanied today.  I last saw him on 09/30/2018 in a virtual visit.  He indicated that he was interested in a new machine.  He was advised to call back so we could prescribe a new machine through his DME company located in [place], [place].  He was otherwise advised to follow-up routinely in 1 year.   Today, 10/03/19: I was not able to review his BiPAP compliance data as he did not bring his machine. He reports compliance with his machine. He would be willing to take his  machine to his DME for a download in the next 2-3 days. He would like to get a new machine. He has continued to benefit from treatment and is motivated to continue. He has received his Covid vaccine.    REVIEW OF SYSTEMS: Out of a complete 14 system review of symptoms, the patient complains only of the following symptoms, neuropathy, fatigue and all other reviewed systems are negative.  ESS: 5/24 FSS: 36/63  ALLERGIES: No Known Allergies  HOME MEDICATIONS: Outpatient Medications Prior to Visit  Medication Sig Dispense Refill   amoxicillin-clavulanate (AUGMENTIN) 875-125 MG tablet Take 1 tablet by mouth every 12 (twelve) hours. 20 tablet 0   aspirin EC 81 MG tablet Take 81 mg by mouth daily.     Coenzyme Q10 200 MG capsule Take 200 mg by mouth every evening.     desloratadine (CLARINEX) 5 MG tablet Take 5 mg by mouth daily.     docusate sodium (COLACE) 100 MG capsule Take 200 mg by mouth daily.     Flaxseed, Linseed, (FLAX SEED OIL) 1000 MG CAPS Take by mouth.     gabapentin (NEURONTIN) 300 MG capsule Take 600 mg by mouth every evening.      gemfibrozil (LOPID) 600 MG tablet Take 600 mg by mouth 2 (two) times daily.     Multiple Vitamin (MULTIVITAMIN WITH MINERALS) TABS tablet Take 1 tablet by mouth daily. Centrum Silver     Omega-3 Fatty Acids (FISH OIL)  1200 MG CAPS Take 1,200-2,400 mg by mouth See admin instructions. 2400 mg in the morning & 1200 mg in the evening.      pantoprazole (PROTONIX) 20 MG tablet Take 20 mg by mouth daily.     pravastatin (PRAVACHOL) 40 MG tablet Take 40 mg by mouth daily.     Probiotic Product (PROBIOTIC DAILY PO) Take 1 tablet by mouth daily.     traZODone (DESYREL) 50 MG tablet Take 1 tablet (50 mg total) by mouth at bedtime as needed for sleep. (Patient taking differently: Take 50 mg by mouth every evening.) 30 tablet 0   No facility-administered medications prior to visit.    PAST MEDICAL HISTORY: Past Medical History:  Diagnosis Date   Anemia     Anxiety    Cancer ([place])    Skin   Charcot-Marie-Tooth disease    hx, ?not confirmed by neurology most recently, neuropathy   GERD (gastroesophageal reflux disease)    Hx of adenomatous colonic polyps    tubulovillous adenoma   Hypertension    Pancytopenia    mild   Pancytopenia 02/27/2011   Restless leg syndrome    Sleep apnea    wears CPAP/BIPAP every night   Thyroid disease    Viral syndrome    Hx   Viral syndrome 02/27/2011    PAST SURGICAL HISTORY: Past Surgical History:  Procedure Laterality Date   BIOPSY  12/31/2017   Procedure: BIOPSY;  Surgeon: Daneil Dolin, MD;  [facility];  Service: Endoscopy;;  gastric biopsy    COLONOSCOPY  01/02/2010   RMR: normal rectum and colon. marginal prep compromised exam.    COLONOSCOPY N/A 03/15/2015   Dr. Gala Romney: tubular adenoma, colonic diverticulosis, surveillance due 2021   COLONOSCOPY WITH PROPOFOL N/A 12/31/2017   Procedure: COLONOSCOPY WITH PROPOFOL;  Surgeon: Daneil Dolin, MD;  [facility];  Service: Endoscopy;  Laterality: N/A;  2:00pm   ESOPHAGOGASTRODUODENOSCOPY N/A 03/15/2015   2 cm hiatal hernia, otherwise normal   ESOPHAGOGASTRODUODENOSCOPY (EGD) WITH PROPOFOL N/A 12/31/2017   Procedure: ESOPHAGOGASTRODUODENOSCOPY (EGD) WITH PROPOFOL;  Surgeon: Daneil Dolin, MD;  [facility];  Service: Endoscopy;  Laterality: N/A;   EYE SURGERY     bilateral cataracts   GANGLION CYST EXCISION Right    knee cartilage repair Bilateral 01/31/2011   NASAL SINUS SURGERY     removal skin cancer      nose   TOTAL KNEE ARTHROPLASTY Right 10/03/2016   Procedure: RIGHT TOTAL KNEE ARTHROPLASTY;  Surgeon: Sydnee Cabal, MD;  [facility]: [facility];  Service: Orthopedics;  Laterality: Right;    FAMILY HISTORY: Family History  Problem Relation Age of Onset   Colon cancer Brother        older than 22    Colon cancer Sister        older than 76     SOCIAL HISTORY: Social History   Socioeconomic History    Marital status: Married    Spouse name: Production manager    Number of children: 2   Years of education: Not on file   Highest education level: Not on file  Occupational History   Occupation: Retired    Fish farm manager: RETIRED  Tobacco Use   Smoking status: Former    Packs/day: 3.00    Years: 28.00    Total pack years: 84.00    Types: Cigarettes    Quit date: 05/22/1981    Years since quitting: 40.6   Smokeless tobacco: Never  Vaping Use  Vaping Use: Never used  Substance and Sexual Activity   Alcohol use: Yes    Comment: rare   Drug use: No   Sexual activity: Yes  Other Topics Concern   Not on file  Social History Narrative   Patient lives at home with his wife.    Patient has 2 children.    Patient is retired.    Social Determinants of Health   Financial Resource Strain: Not on file  Food Insecurity: Not on file  Transportation Needs: Not on file  Physical Activity: Not on file  Stress: Not on file  Social Connections: Not on file  Intimate Partner Violence: Not on file     PHYSICAL EXAM  Vitals:   01/08/22 1327  BP: 119/80  Pulse: 80  Weight: 245 lb (111.1 kg)  Height: '5\' 9"'$  (1.753 m)    Body mass index is 36.18 kg/m.  Generalized: Well developed, in no acute distress  Cardiology: normal rate and rhythm, no murmur noted Respiratory: clear to auscultation bilaterally  Neurological examination  Mentation: Alert oriented to time, place, history taking. Follows all commands speech and language fluent Cranial nerve II-XII: Pupils were equal round reactive to light. Extraocular movements were full, visual field were full  Motor: The motor testing reveals 5 over 5 strength of all 4 extremities. Good symmetric motor tone is noted throughout.  Gait and station: Gait is normal.    DIAGNOSTIC DATA (LABS, IMAGING, TESTING) - I reviewed patient records, labs, notes, testing and imaging myself where available.     09/18/2016    4:22 PM  MMSE - Mini Mental State Exam   Orientation to time 5  Orientation to Place 5  Registration 3  Attention/ Calculation 5  Recall 2  Language- name 2 objects 2  Language- repeat 1  Language- follow 3 step command 3  Language- read & follow direction 1  Write a sentence 1  Copy design 1  Total score 29     Lab Results  Component Value Date   WBC 9.4 05/27/2021   HGB 12.4 (L) 05/27/2021   HCT 36.5 (L) 05/27/2021   MCV 90.1 05/27/2021   PLT 233 05/27/2021      Component Value Date/Time   NA 135 05/27/2021 1302   K 3.9 05/27/2021 1302   CL 102 05/27/2021 1302   CO2 23 05/27/2021 1302   GLUCOSE 112 (H) 05/27/2021 1302   BUN 35 (H) 05/27/2021 1302   CREATININE 2.88 (H) 05/27/2021 1302   CALCIUM 8.8 (L) 05/27/2021 1302   PROT 7.7 05/27/2021 1302   ALBUMIN 3.7 05/27/2021 1302   AST 19 05/27/2021 1302   ALT 16 05/27/2021 1302   ALKPHOS 34 (L) 05/27/2021 1302   BILITOT 0.8 05/27/2021 1302   GFRNONAA 22 (L) 05/27/2021 1302   GFRAA >60 12/25/2017 1417   No results found for: "CHOL", "HDL", "LDLCALC", "LDLDIRECT", "TRIG", "CHOLHDL" No results found for: "HGBA1C" Lab Results  Component Value Date   VITAMINB12 >1999 (H) 02/24/2014   Lab Results  Component Value Date   TSH 1.590 02/24/2014     ASSESSMENT AND PLAN 76 y.o. year old male  has a past medical history of Anemia, Anxiety, Cancer ([place]), Charcot-Marie-Tooth disease, GERD (gastroesophageal reflux disease), adenomatous colonic polyps, Hypertension, Pancytopenia, Pancytopenia (02/27/2011), Restless leg syndrome, Sleep apnea, Thyroid disease, Viral syndrome, and Viral syndrome (02/27/2011). here with     ICD-10-CM   1. OSA treated with BiPAP  G47.33 For home use only DME continuous positive airway pressure (  CPAP)        TYRESS LODEN is doing well on BiPAP therapy. Compliance report reveals excellent. He was encouraged to continue using BiPAP nightly and for greater than 4 hours each night. We will update supply orders as indicated. Risks of untreated  sleep apnea review and education materials provided. Healthy lifestyle habits encouraged. Consider adding daily exercise. Continue monitoring BP and close follow up with nephrology/PCP. He will follow up in 1 year, sooner if needed. He verbalizes understanding and agreement with this plan.    Orders Placed This Encounter  Procedures   For home use only DME continuous positive airway pressure (CPAP)    Supplies    Order Specific Question:   Length of Need    Answer:   Lifetime    Order Specific Question:   Patient has OSA or probable OSA    Answer:   Yes    Order Specific Question:   Is the patient currently using CPAP in the home    Answer:   Yes    Order Specific Question:   Settings    Answer:   Other see comments    Order Specific Question:   CPAP supplies needed    Answer:   Mask, headgear, cushions, filters, heated tubing and water chamber      No orders of the defined types were placed in this encounter.    Debbora Presto, FNP-C 01/08/2022, 2:20 PM [facility] [address] [place], [place] [address] [phone]

## 2022-01-08 NOTE — Patient Instructions (Addendum)
Please continue using your BiPAP regularly. While your insurance requires that you use BiPAP at least 4 hours each night on 70% of the nights, I recommend, that you not skip any nights and use it throughout the night if you can. Getting used to BiPAP and staying with the treatment long term does take time and patience and discipline. Untreated obstructive sleep apnea when it is moderate to severe can have an adverse impact on cardiovascular health and raise her risk for heart disease, arrhythmias, hypertension, congestive heart failure, stroke and diabetes. Untreated obstructive sleep apnea causes sleep disruption, nonrestorative sleep, and sleep deprivation. This can have an impact on your day to day functioning and cause daytime sleepiness and impairment of cognitive function, memory loss, mood disturbance, and problems focussing. Using BiPAP regularly can improve these symptoms.   Follow up in 1 year  

## 2022-01-09 NOTE — Progress Notes (Signed)
New order faxed to DME: [facility] Fax #: [phone]  Fax confirmation received

## 2022-06-24 ENCOUNTER — Encounter: Payer: Self-pay | Admitting: *Deleted

## 2022-06-24 ENCOUNTER — Ambulatory Visit: Payer: Medicare Other | Attending: Internal Medicine | Admitting: Internal Medicine

## 2022-06-24 ENCOUNTER — Encounter: Payer: Self-pay | Admitting: Internal Medicine

## 2022-06-24 VITALS — BP 118/74 | HR 82 | Ht 69.0 in | Wt 234.8 lb

## 2022-06-24 DIAGNOSIS — I10 Essential (primary) hypertension: Secondary | ICD-10-CM

## 2022-06-24 DIAGNOSIS — R0609 Other forms of dyspnea: Secondary | ICD-10-CM

## 2022-06-24 NOTE — Patient Instructions (Addendum)
Medication Instructions:  Your physician recommends that you continue on your current medications as directed. Please refer to the Current Medication list given to you today.  Labwork: none  Testing/Procedures: Your physician has requested that you have an echocardiogram. Echocardiography is a painless test that uses sound waves to create images of your heart. It provides your doctor with information about the size and shape of your heart and how well your heart's chambers and valves are working. This procedure takes approximately one hour. There are no restrictions for this procedure. Please do NOT wear cologne, perfume, aftershave, or lotions (deodorant is allowed). Please arrive 15 minutes prior to your appointment time. Your physician has requested that you have a lexiscan myoview. For further information please visit [email or web address]. Please follow instruction sheet, as given.  Follow-Up: Your physician recommends that you schedule a follow-up appointment in: as needed  Any Other Special Instructions Will Be Listed Below (If Applicable).  If you need a refill on your cardiac medications before your next appointment, please call your pharmacy. 

## 2022-06-24 NOTE — Progress Notes (Signed)
Cardiology Office Note  Date: 06/24/2022   ID: Dakota Thompson, DOB 1946/02/04, MRN [id number]  PCP:  [facility]  Cardiologist:  Chalmers Guest, MD Electrophysiologist:  None   Reason for Office Visit: Evaluation of belching sensation at the request of Dr. Riley Kill   History of Present Illness: Dakota Thompson is a 77 y.o. male known to have  HLD, OSA on CPAP was referred to cardiology clinic for evaluation of belching sensation.  Patient has been having belching sensation for the last 10 years and he thinks it could be from eating fast. He underwent Pharmacological nuclear stress test in 2019 which showed no evidence of ischemia. USG carotid bilateral was done in 2020 which showed minimal atherosclerotic plaque in the carotids. He presented today as a new patient visit. He reported having DOE with some exertional activities for the last couple of months with no leg swelling.  Denied any angina, dizziness, lightness, syncope, palpitations. No interval history of ER visits or hospitalizations for chest pain. Denies smoking cigarettes, illicit drug abuse and alcohol use. No prior history of MI/PCI/CABG.  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 marginal prep compromised exam.     COLONOSCOPY N/A 03/15/2015   Dr. Gala Romney: tubular adenoma, colonic diverticulosis, surveillance due 2021   COLONOSCOPY WITH PROPOFOL N/A 12/31/2017   Procedure: COLONOSCOPY WITH PROPOFOL;  Surgeon: Daneil Dolin, MD;  [facility];  Service: Endoscopy;  Laterality: N/A;  2:00pm   ESOPHAGOGASTRODUODENOSCOPY N/A 03/15/2015   2 cm hiatal hernia, otherwise normal   ESOPHAGOGASTRODUODENOSCOPY (EGD) WITH PROPOFOL N/A 12/31/2017   Procedure: ESOPHAGOGASTRODUODENOSCOPY (EGD) WITH PROPOFOL;  Surgeon: Daneil Dolin, MD;  [facility];  Service: Endoscopy;  Laterality: N/A;   EYE SURGERY     bilateral cataracts   GANGLION CYST EXCISION Right    knee cartilage repair Bilateral 01/31/2011   NASAL SINUS SURGERY     removal skin cancer      nose   TOTAL KNEE ARTHROPLASTY Right 10/03/2016   Procedure: RIGHT TOTAL KNEE ARTHROPLASTY;  Surgeon: Sydnee Cabal, MD;  [facility]: [facility];  Service: Orthopedics;  Laterality: Right;    Current Outpatient Medications  Medication Sig Dispense Refill   aspirin EC 81 MG tablet Take 81 mg by mouth daily.     carbamazepine (TEGRETOL) 200 MG tablet Take 200 mg by mouth 2 (two) times daily.     Coenzyme Q10 200 MG capsule Take 200 mg by mouth every evening.     desloratadine (CLARINEX) 5 MG tablet Take 5 mg by mouth daily.     docusate sodium (COLACE) 100 MG  capsule Take 200 mg by mouth daily.     Flaxseed, Linseed, (FLAX SEED OIL) 1000 MG CAPS Take 1,000 capsules by mouth 2 (two) times daily.     gemfibrozil (LOPID) 600 MG tablet Take 600 mg by mouth 2 (two) times daily.     Multiple Vitamin (MULTIVITAMIN WITH MINERALS) TABS tablet Take 1 tablet by mouth daily. Centrum Silver     Omega-3 Fatty Acids (FISH OIL) 1200 MG CAPS Take 1,200-2,400 mg by mouth See admin instructions. 2400 mg in the morning & 1200 mg in the evening.      pantoprazole (PROTONIX) 40 MG tablet Take 40 mg by mouth 2 (two) times daily.     pramipexole (MIRAPEX) 0.5 MG tablet  Take 0.5 mg by mouth daily.     pravastatin (PRAVACHOL) 40 MG tablet Take 40 mg by mouth daily.     Probiotic Product (PROBIOTIC DAILY PO) Take 1 tablet by mouth daily.     No current facility-administered medications for this visit.   Allergies:  Patient has no known allergies.   Social History: The patient  reports that he quit smoking about 41 years ago. His smoking use included cigarettes. He has a 84.00 pack-year smoking history. He has never used smokeless tobacco. He reports current alcohol use. He reports that he does not use drugs.   Family History: The patient's family history includes Colon cancer in his brother and sister.   ROS:  Please see the history of present illness. Otherwise, complete review of systems is positive for none.  All other systems are reviewed and negative.   Physical Exam: VS:  BP 118/74   Pulse 82   Ht '5\' 9"'$  (1.753 m)   Wt 234 lb 12.8 oz (106.5 kg)   SpO2 93%   BMI 34.67 kg/m , BMI Body mass index is 34.67 kg/m.  Wt Readings from Last 3 Encounters:  06/24/22 234 lb 12.8 oz (106.5 kg)  01/08/22 245 lb (111.1 kg)  05/27/21 250 lb (113.4 kg)    General: Patient appears comfortable at rest. HEENT: Conjunctiva and lids normal, oropharynx clear with moist mucosa. Neck: Supple, no elevated JVP or carotid bruits, no thyromegaly. Lungs: Clear to auscultation, nonlabored breathing at rest. Cardiac: Regular rate and rhythm, no S3 or significant systolic murmur, no pericardial rub. Abdomen: Soft, nontender, no hepatomegaly, bowel sounds present, no guarding or rebound. Extremities: No pitting edema, distal pulses 2+. Skin: Warm and dry. Musculoskeletal: No kyphosis. Neuropsychiatric: Alert and oriented x3, affect grossly appropriate.  ECG: Normal sinus rhythm and no ST-T changes  Recent Labwork: No results found for requested labs within last 365 days.  No results found for: "CHOL", "TRIG", "HDL", "CHOLHDL", "VLDL", "LDLCALC", "LDLDIRECT"  Other  Studies Reviewed Today: NM stress test in 2019 No evidence of ischemia  USG carotids in 2020 Minor carotid atherosclerosis.  No significant ICA stenosis.  Degree of narrowing less than 50% bilaterally by ultrasound criteria.  Assessment and Plan: Patient is a 77 year old M known to have HLD, OSA on CPAP was referred to cardiology clinic for DOE.  # DOE -Patient has been having DOE with moderate exertion x couple months. Obtain 2D echocardiogram and pharmacological nuclear stress test (has bad knees).  # HLD -Continue gemfibrozil 600 mg twice daily, pravastatin 40 mg once daily. No myalgias. -Management of HLD per PCP  I have spent a total of 41 minutes with patient reviewing chart, EKGs, labs and examining patient as well as establishing an assessment and plan that was discussed  with the patient.  > 50% of time was spent in direct patient care.     Medication Adjustments/Labs and Tests Ordered: Current medicines are reviewed at length with the patient today.  Concerns regarding medicines are outlined above.   Tests Ordered: No orders of the defined types were placed in this encounter.   Medication Changes: No orders of the defined types were placed in this encounter.   Disposition:  Follow up prn  Signed Nelwyn Hebdon Fidel Levy, MD, 06/24/2022 9:09 AM    [facility] [facility] [place], [place], [place] [address]

## 2022-06-25 ENCOUNTER — Other Ambulatory Visit: Payer: Self-pay | Admitting: Internal Medicine

## 2022-06-25 DIAGNOSIS — I10 Essential (primary) hypertension: Secondary | ICD-10-CM

## 2022-06-25 DIAGNOSIS — R0609 Other forms of dyspnea: Secondary | ICD-10-CM

## 2022-06-27 ENCOUNTER — Encounter (HOSPITAL_COMMUNITY)
Admission: RE | Admit: 2022-06-27 | Discharge: 2022-06-27 | Disposition: A | Discharge status: Home / Self Care | Payer: Medicare Other | Source: Ambulatory Visit | Attending: Internal Medicine | Admitting: Internal Medicine

## 2022-06-27 ENCOUNTER — Ambulatory Visit (HOSPITAL_COMMUNITY)
Admission: RE | Admit: 2022-06-27 | Discharge: 2022-06-27 | Disposition: A | Discharge status: Home / Self Care | Payer: Medicare Other | Source: Ambulatory Visit | Attending: Internal Medicine | Admitting: Internal Medicine

## 2022-06-27 ENCOUNTER — Encounter (HOSPITAL_COMMUNITY): Payer: Self-pay

## 2022-06-27 DIAGNOSIS — R0609 Other forms of dyspnea: Secondary | ICD-10-CM | POA: Diagnosis present

## 2022-06-27 HISTORY — DX: Disorder of kidney and ureter, unspecified: N28.9

## 2022-06-27 LAB — NM MYOCAR MULTI W/SPECT W/WALL MOTION / EF
LV dias vol: 115 mL (ref 62–150)
LV sys vol: 36 mL
Nuc Stress EF: 69 %
Peak HR: 108 {beats}/min
RATE: 0.5
Rest HR: 72 {beats}/min
Rest Nuclear Isotope Dose: 11 mCi
SDS: 2
SRS: 3
SSS: 5
ST Depression (mm): 0 mm
Stress Nuclear Isotope Dose: 30.7 mCi
TID: 1.08

## 2022-06-27 MED ORDER — TECHNETIUM TC 99M TETROFOSMIN IV KIT
10.0000 | PACK | Freq: Once | INTRAVENOUS | Status: AC | PRN
  Administered 2022-06-27: 11 via INTRAVENOUS

## 2022-06-27 MED ORDER — TECHNETIUM TC 99M TETROFOSMIN IV KIT
30.0000 | PACK | Freq: Once | INTRAVENOUS | Status: AC | PRN
  Administered 2022-06-27: 30.7 via INTRAVENOUS

## 2022-06-27 MED ORDER — REGADENOSON 0.4 MG/5ML IV SOLN
INTRAVENOUS | Status: AC
  Administered 2022-06-27: 0.4 mg via INTRAVENOUS
  Filled 2022-06-27: qty 5

## 2022-06-27 MED ORDER — SODIUM CHLORIDE FLUSH 0.9 % IV SOLN
INTRAVENOUS | Status: AC
  Administered 2022-06-27: 10 mL via INTRAVENOUS
  Filled 2022-06-27: qty 10

## 2022-06-30 ENCOUNTER — Ambulatory Visit: Payer: Medicare Other | Attending: Internal Medicine

## 2022-06-30 DIAGNOSIS — R0609 Other forms of dyspnea: Secondary | ICD-10-CM

## 2022-07-01 LAB — ECHOCARDIOGRAM COMPLETE
AR max vel: 1.51 cm2
AV Area VTI: 1.64 cm2
AV Area mean vel: 1.61 cm2
AV Mean grad: 11.6 mmHg
AV Peak grad: 21.8 mmHg
Ao pk vel: 2.34 m/s
Area-P 1/2: 2.87 cm2
Calc EF: 75.7 %
MV M vel: 1.67 m/s
MV Peak grad: 11.2 mmHg
P 1/2 time: 676 msec
S' Lateral: 2.7 cm
Single Plane A2C EF: 75.9 %
Single Plane A4C EF: 73.9 %

## 2022-07-02 ENCOUNTER — Telehealth: Payer: Self-pay | Admitting: Internal Medicine

## 2022-07-02 NOTE — Telephone Encounter (Signed)
  Pt's wife calling to get results

## 2022-07-03 NOTE — Telephone Encounter (Signed)
Patient's wife Freda Munro informed and verbalized understanding of plan.

## 2022-07-08 ENCOUNTER — Telehealth: Payer: Self-pay | Admitting: Internal Medicine

## 2022-07-08 NOTE — Telephone Encounter (Signed)
Patient's wife is returning RN's call for stress test results. Please advise.

## 2022-07-09 NOTE — Telephone Encounter (Signed)
Patient's wife Freda Munro informed. Copy sent to PCP

## 2022-07-10 ENCOUNTER — Encounter: Payer: Medicare Other | Attending: Internal Medicine | Admitting: Internal Medicine

## 2022-07-10 ENCOUNTER — Encounter: Payer: Self-pay | Admitting: Internal Medicine

## 2022-07-10 VITALS — BP 142/84 | HR 83 | Ht 69.0 in | Wt 233.2 lb

## 2022-07-10 DIAGNOSIS — Z79899 Other long term (current) drug therapy: Secondary | ICD-10-CM

## 2022-07-10 DIAGNOSIS — Z01810 Encounter for preprocedural cardiovascular examination: Secondary | ICD-10-CM | POA: Insufficient documentation

## 2022-07-10 DIAGNOSIS — G4733 Obstructive sleep apnea (adult) (pediatric): Secondary | ICD-10-CM | POA: Diagnosis not present

## 2022-07-10 DIAGNOSIS — R9439 Abnormal result of other cardiovascular function study: Secondary | ICD-10-CM | POA: Diagnosis not present

## 2022-07-10 NOTE — Patient Instructions (Signed)
Medication Instructions:  Your physician recommends that you continue on your current medications as directed. Please refer to the Current Medication list given to you today.   Labwork: CMP- 07/11/21  Testing/Procedures: None  Follow-Up: Follow up with Dr. Dellia Cloud in 3 months.   Any Other Special Instructions Will Be Listed Below (If Applicable).     If you need a refill on your cardiac medications before your next appointment, please call your pharmacy.

## 2022-07-10 NOTE — H&P (View-Only) (Signed)
Cardiology Office Note  Date: 07/10/2022   ID: Dakota Thompson, DOB 05-15-1946, MRN [id number]  PCP:  Redmond School, MD  Cardiologist:  Chalmers Guest, MD Electrophysiologist:  None   Reason for Office Visit: To discuss abnormal stress test results   History of Present Illness: Dakota Thompson is a 77 y.o. male known to have HLD, OSA on CPAP, CKD presented to cardiology clinic for abnormal stress test results.  Patient was initially referred to cardiology clinic on 06/24/2022 for evaluation of belching sensation which has been ongoing for the last 10 years. He underwent formal general nuclear stress test in 2019 with no evidence of ischemia. USG carotid bilateral in 2020 showed minimal atherosclerotic plaque in the carotids. He presented today for follow-up visit and is also requesting for preop cardiac risk stratification prior to shoulder surgery.  He continues to have shortness of breath on exertion which has been going on for quite a while and reported recent worsening but believes this could be from his weight and also he was told recently this could be from asbestos exposure during his work years.  He is currently undergoing evaluation for asbestos exposure.  No leg swelling. Denies any angina, dizziness, lightheadedness, syncope, leg swelling, palpitations.  Past Medical History:  Diagnosis Date   Anemia    Anxiety    Cancer ([place])    Skin   Charcot-Marie-Tooth disease    hx, ?not confirmed by neurology most recently, neuropathy   GERD (gastroesophageal reflux disease)    Hx of adenomatous colonic polyps    tubulovillous adenoma   Hypertension    Pancytopenia    mild   Pancytopenia 02/27/2011   Renal insufficiency    Restless leg syndrome    Sleep apnea    wears CPAP/BIPAP every night   Thyroid disease    Viral syndrome    Hx   Viral syndrome 02/27/2011    Past Surgical History:  Procedure Laterality Date   BIOPSY  12/31/2017   Procedure: BIOPSY;  Surgeon: Daneil Dolin, MD;  [facility];  Service: Endoscopy;;  gastric biopsy    COLONOSCOPY  01/02/2010   RMR: normal rectum and colon. marginal prep compromised exam.    COLONOSCOPY N/A 03/15/2015   Dr. Gala Romney: tubular adenoma, colonic diverticulosis, surveillance due 2021   COLONOSCOPY WITH PROPOFOL N/A 12/31/2017   Procedure: COLONOSCOPY WITH PROPOFOL;  Surgeon: Daneil Dolin, MD;  [facility];  Service: Endoscopy;  Laterality: N/A;  2:00pm   ESOPHAGOGASTRODUODENOSCOPY N/A 03/15/2015   2 cm hiatal hernia, otherwise normal   ESOPHAGOGASTRODUODENOSCOPY (EGD) WITH PROPOFOL N/A 12/31/2017   Procedure: ESOPHAGOGASTRODUODENOSCOPY (EGD) WITH PROPOFOL;  Surgeon: Daneil Dolin, MD;  [facility];  Service: Endoscopy;  Laterality: N/A;   EYE SURGERY     bilateral cataracts   GANGLION CYST EXCISION Right    knee cartilage repair Bilateral 01/31/2011   NASAL SINUS SURGERY     removal skin cancer      nose   TOTAL KNEE ARTHROPLASTY Right 10/03/2016   Procedure: RIGHT TOTAL KNEE ARTHROPLASTY;  Surgeon: Sydnee Cabal, MD;  [facility];  Service: Orthopedics;  Laterality: Right;    Current Outpatient Medications  Medication Sig Dispense Refill   aspirin EC 81 MG tablet Take 81 mg by mouth daily.     carbamazepine (TEGRETOL) 200 MG tablet Take 200 mg by mouth 2 (two) times daily.     Coenzyme Q10 200 MG capsule Take 200 mg by  mouth every evening.     desloratadine (CLARINEX) 5 MG tablet Take 5 mg by mouth daily.     docusate sodium (COLACE) 100 MG capsule Take 200 mg by mouth daily.     Flaxseed, Linseed, (FLAX SEED OIL) 1000 MG CAPS Take 1,000 capsules by mouth 2 (two) times daily.     gemfibrozil (LOPID) 600 MG tablet Take 600 mg by mouth 2 (two) times daily.     Multiple Vitamin (MULTIVITAMIN WITH MINERALS) TABS tablet Take 1 tablet by mouth daily. Centrum Silver     Omega-3 Fatty Acids (FISH OIL) 1200 MG CAPS Take 1,200-2,400 mg by mouth See admin instructions. 2400  mg in the morning & 1200 mg in the evening.      pantoprazole (PROTONIX) 40 MG tablet Take 40 mg by mouth 2 (two) times daily.     pramipexole (MIRAPEX) 0.5 MG tablet Take 0.5 mg by mouth daily.     pravastatin (PRAVACHOL) 40 MG tablet Take 40 mg by mouth daily.     Probiotic Product (PROBIOTIC DAILY PO) Take 1 tablet by mouth daily.     No current facility-administered medications for this visit.   Allergies:  Patient has no known allergies.   Social History: The patient  reports that he quit smoking about 41 years ago. His smoking use included cigarettes. He has a 84.00 pack-year smoking history. He has never used smokeless tobacco. He reports current alcohol use. He reports that he does not use drugs.   Family History: The patient's family history includes Colon cancer in his brother and sister.   ROS:  Please see the history of present illness. Otherwise, complete review of systems is positive for none.  All other systems are reviewed and negative.   Physical Exam: VS:  BP (!) 142/84   Pulse 83   Ht '5\' 9"'$  (1.753 m)   Wt 233 lb 3.2 oz (105.8 kg)   SpO2 95%   BMI 34.44 kg/m , BMI Body mass index is 34.44 kg/m.  Wt Readings from Last 3 Encounters:  07/10/22 233 lb 3.2 oz (105.8 kg)  06/24/22 234 lb 12.8 oz (106.5 kg)  01/08/22 245 lb (111.1 kg)    General: Patient appears comfortable at rest. HEENT: Conjunctiva and lids normal, oropharynx clear with moist mucosa. Neck: Supple, no elevated JVP or carotid bruits, no thyromegaly. Lungs: Clear to auscultation, nonlabored breathing at rest. Cardiac: Regular rate and rhythm, no S3 or significant systolic murmur, no pericardial rub. Abdomen: Soft, nontender, no hepatomegaly, bowel sounds present, no guarding or rebound. Extremities: No pitting edema, distal pulses 2+. Skin: Warm and dry. Musculoskeletal: No kyphosis. Neuropsychiatric: Alert and oriented x3, affect grossly appropriate.  ECG:  An ECG dated 05/28/2021 was personally  reviewed today and demonstrated:  Normal sinus rhythm and no ST-T changes  Recent Labwork: No results found for requested labs within last 365 days.  No results found for: "CHOL", "TRIG", "HDL", "CHOLHDL", "VLDL", "LDLCALC", "LDLDIRECT"  Other Studies Reviewed Today: NM stress test from 06/27/2022   Findings are consistent with ischemia. The study is intermediate risk.   No ST deviation was noted. The ECG was negative for ischemia.   LV perfusion is abnormal.  Moderate sized, mild intensity, apical to basal inferior perfusion defect that is partially reversible and consistent with ischemia, possibly RCA distribution.   Left ventricular function is normal. Nuclear stress EF: 69 %. End diastolic cavity size is normal. End systolic cavity size is normal.   Overall intermediate risk study with  moderate sized, partially reversible inferior wall defect suggestive of ischemia, LVEF 69%.  Echocardiogram on 07/01/2022 Normal LVEF Mild aortic valve stenosis  Assessment and Plan: Patient is a 77 year old M known to have HLD, OSA on CPAP, asbestos exposure presented to cardiology clinic for follow-up visit.  # Positive cardiac stress test Addendum -Patient underwent NM stress test for DOE symptoms which showed moderate-sized, partially reversible inferior wall defect suggestive of ischemia from apical to basal inferior wall. He continues to have symptoms of DOE and thinks it could be from his weight or from asbestos exposure. Denies any angina. Serum creatinine was 1.28 with GFR 59 on 05/19/2022 and serum creatinine was 1.29 with a GFR 58 on 07/11/2022. He will benefit from [facility] due to symptoms of DOE and positive stress test. His serum creatinine and GFR is in acceptable range to undergo LHC and he will need pre and post procedure hydration. Risks and benefits of cardiac catheterization have been discussed with the patient. These include bleeding, infection, kidney damage, stroke, heart attack, death. The  patient understands these risks and is willing to proceed. -Continue aspirin 81 mg once daily -Continue gemfibrozil 600 mg twice daily and pravastatin 40 mg nightly.  # Preop cardiac risk stratification for shoulder surgery scheduled in 07/2022 -Patient has symptoms of DOE and positive cardiac stress test. Will schedule him for [facility] and shoulder surgery will need to be deferred.  # HLD -Continue gemfibrozil 600 mg twice daily and pravastatin 40 mg nightly.  # OSA on CPAP -Encouraged compliance with CPAP therapy  I have spent a total of 33 minutes with patient reviewing chart, EKGs, labs and examining patient as well as establishing an assessment and plan that was discussed with the patient.  > 50% of time was spent in direct patient care.    Medication Adjustments/Labs and Tests Ordered: Current medicines are reviewed at length with the patient today.  Concerns regarding medicines are outlined above.   Tests Ordered: Orders Placed This Encounter  Procedures   Comprehensive metabolic panel    Medication Changes: No orders of the defined types were placed in this encounter.   Disposition:  Follow up  3 months  Signed, Mikias Lanz Fidel Levy, MD, 07/10/2022 4:11 PM    [facility] [place] [address] [place], [place] [address]

## 2022-07-10 NOTE — Progress Notes (Addendum)
Cardiology Office Note  Date: 07/10/2022   ID: Dakota Thompson, DOB 1945/07/19, MRN [id number]  PCP:  Redmond School, MD  Cardiologist:  Chalmers Guest, MD Electrophysiologist:  None   Reason for Office Visit: To discuss abnormal stress test results   History of Present Illness: Dakota Thompson is a 77 y.o. male known to have HLD, OSA on CPAP, CKD presented to cardiology clinic for abnormal stress test results.  Patient was initially referred to cardiology clinic on 06/24/2022 for evaluation of belching sensation which has been ongoing for the last 10 years. He underwent formal general nuclear stress test in 2019 with no evidence of ischemia. USG carotid bilateral in 2020 showed minimal atherosclerotic plaque in the carotids. He presented today for follow-up visit and is also requesting for preop cardiac risk stratification prior to shoulder surgery.  He continues to have shortness of breath on exertion which has been going on for quite a while and reported recent worsening but believes this could be from his weight and also he was told recently this could be from asbestos exposure during his work years.  He is currently undergoing evaluation for asbestos exposure.  No leg swelling. Denies any angina, dizziness, lightheadedness, syncope, leg swelling, palpitations.  Past Medical History:  Diagnosis Date   Anemia    Anxiety    Cancer ([facility])    Skin   Charcot-Marie-Tooth disease    hx, ?not confirmed by neurology most recently, neuropathy   GERD (gastroesophageal reflux disease)    Hx of adenomatous colonic polyps    tubulovillous adenoma   Hypertension    Pancytopenia    mild   Pancytopenia 02/27/2011   Renal insufficiency    Restless leg syndrome    Sleep apnea    wears CPAP/BIPAP every night   Thyroid disease    Viral syndrome    Hx   Viral syndrome 02/27/2011    Past Surgical History:  Procedure Laterality Date   BIOPSY  12/31/2017   Procedure: BIOPSY;  Surgeon: Daneil Dolin, MD;  [facility];  Service: Endoscopy;;  gastric biopsy    COLONOSCOPY  01/02/2010   RMR: normal rectum and colon. marginal prep compromised exam.    COLONOSCOPY N/A 03/15/2015   Dr. Gala Romney: tubular adenoma, colonic diverticulosis, surveillance due 2021   COLONOSCOPY WITH PROPOFOL N/A 12/31/2017   Procedure: COLONOSCOPY WITH PROPOFOL;  Surgeon: Daneil Dolin, MD;  [facility];  Service: Endoscopy;  Laterality: N/A;  2:00pm   ESOPHAGOGASTRODUODENOSCOPY N/A 03/15/2015   2 cm hiatal hernia, otherwise normal   ESOPHAGOGASTRODUODENOSCOPY (EGD) WITH PROPOFOL N/A 12/31/2017   Procedure: ESOPHAGOGASTRODUODENOSCOPY (EGD) WITH PROPOFOL;  Surgeon: Daneil Dolin, MD;  [facility];  Service: Endoscopy;  Laterality: N/A;   EYE SURGERY     bilateral cataracts   GANGLION CYST EXCISION Right    knee cartilage repair Bilateral 01/31/2011   NASAL SINUS SURGERY     removal skin cancer      nose   TOTAL KNEE ARTHROPLASTY Right 10/03/2016   Procedure: RIGHT TOTAL KNEE ARTHROPLASTY;  Surgeon: Sydnee Cabal, MD;  [facility];  Service: Orthopedics;  Laterality: Right;    Current Outpatient Medications  Medication Sig Dispense Refill   aspirin EC 81 MG tablet Take 81 mg by mouth daily.     carbamazepine (TEGRETOL) 200 MG tablet Take 200 mg by mouth 2 (two) times daily.     Coenzyme Q10 200 MG capsule Take 200 mg by  mouth every evening.     desloratadine (CLARINEX) 5 MG tablet Take 5 mg by mouth daily.     docusate sodium (COLACE) 100 MG capsule Take 200 mg by mouth daily.     Flaxseed, Linseed, (FLAX SEED OIL) 1000 MG CAPS Take 1,000 capsules by mouth 2 (two) times daily.     gemfibrozil (LOPID) 600 MG tablet Take 600 mg by mouth 2 (two) times daily.     Multiple Vitamin (MULTIVITAMIN WITH MINERALS) TABS tablet Take 1 tablet by mouth daily. Centrum Silver     Omega-3 Fatty Acids (FISH OIL) 1200 MG CAPS Take 1,200-2,400 mg by mouth See admin instructions. 2400  mg in the morning & 1200 mg in the evening.      pantoprazole (PROTONIX) 40 MG tablet Take 40 mg by mouth 2 (two) times daily.     pramipexole (MIRAPEX) 0.5 MG tablet Take 0.5 mg by mouth daily.     pravastatin (PRAVACHOL) 40 MG tablet Take 40 mg by mouth daily.     Probiotic Product (PROBIOTIC DAILY PO) Take 1 tablet by mouth daily.     No current facility-administered medications for this visit.   Allergies:  Patient has no known allergies.   Social History: The patient  reports that he quit smoking about 41 years ago. His smoking use included cigarettes. He has a 84.00 pack-year smoking history. He has never used smokeless tobacco. He reports current alcohol use. He reports that he does not use drugs.   Family History: The patient's family history includes Colon cancer in his brother and sister.   ROS:  Please see the history of present illness. Otherwise, complete review of systems is positive for none.  All other systems are reviewed and negative.   Physical Exam: VS:  BP (!) 142/84   Pulse 83   Ht '5\' 9"'$  (1.753 m)   Wt 233 lb 3.2 oz (105.8 kg)   SpO2 95%   BMI 34.44 kg/m , BMI Body mass index is 34.44 kg/m.  Wt Readings from Last 3 Encounters:  07/10/22 233 lb 3.2 oz (105.8 kg)  06/24/22 234 lb 12.8 oz (106.5 kg)  01/08/22 245 lb (111.1 kg)    General: Patient appears comfortable at rest. HEENT: Conjunctiva and lids normal, oropharynx clear with moist mucosa. Neck: Supple, no elevated JVP or carotid bruits, no thyromegaly. Lungs: Clear to auscultation, nonlabored breathing at rest. Cardiac: Regular rate and rhythm, no S3 or significant systolic murmur, no pericardial rub. Abdomen: Soft, nontender, no hepatomegaly, bowel sounds present, no guarding or rebound. Extremities: No pitting edema, distal pulses 2+. Skin: Warm and dry. Musculoskeletal: No kyphosis. Neuropsychiatric: Alert and oriented x3, affect grossly appropriate.  ECG:  An ECG dated 05/28/2021 was personally  reviewed today and demonstrated:  Normal sinus rhythm and no ST-T changes  Recent Labwork: No results found for requested labs within last 365 days.  No results found for: "CHOL", "TRIG", "HDL", "CHOLHDL", "VLDL", "LDLCALC", "LDLDIRECT"  Other Studies Reviewed Today: NM stress test from 06/27/2022   Findings are consistent with ischemia. The study is intermediate risk.   No ST deviation was noted. The ECG was negative for ischemia.   LV perfusion is abnormal.  Moderate sized, mild intensity, apical to basal inferior perfusion defect that is partially reversible and consistent with ischemia, possibly RCA distribution.   Left ventricular function is normal. Nuclear stress EF: 69 %. End diastolic cavity size is normal. End systolic cavity size is normal.   Overall intermediate risk study with  moderate sized, partially reversible inferior wall defect suggestive of ischemia, LVEF 69%.  Echocardiogram on 07/01/2022 Normal LVEF Mild aortic valve stenosis  Assessment and Plan: Patient is a 77 year old M known to have HLD, OSA on CPAP, asbestos exposure presented to cardiology clinic for follow-up visit.  # Positive cardiac stress test Addendum -Patient underwent LHC and showed mild non-obstructive CAD -Continue gemfibrozil 600 mg twice daily and pravastatin 40 mg nightly.  # Preop cardiac risk stratification for shoulder surgery scheduled in 07/2022 -Patient has symptoms of DOE and positive cardiac stress test. He was scheduled for LHC that showed mild, non-obstructive CAD. He is at a low risk for any peri-operative cardiac complications. No further cardiac testing is indicated prior to proceeding with the planned procedure. -Aspirin 81 mg can be held 5 days prior to the procedure and resumed when stable from bleeding standpoint.  # HLD -Continue gemfibrozil 600 mg twice daily and pravastatin 40 mg nightly.  # OSA on CPAP -Encouraged compliance with CPAP therapy  I have spent a total of 33  minutes with patient reviewing chart, EKGs, labs and examining patient as well as establishing an assessment and plan that was discussed with the patient.  > 50% of time was spent in direct patient care.    Medication Adjustments/Labs and Tests Ordered: Current medicines are reviewed at length with the patient today.  Concerns regarding medicines are outlined above.   Tests Ordered: Orders Placed This Encounter  Procedures   Comprehensive metabolic panel    Medication Changes: No orders of the defined types were placed in this encounter.   Disposition:  Follow up  3 months  Signed, Daden Mahany Fidel Levy, MD, 07/10/2022 4:11 PM    [facility] [place] [address], [place], [place] [address]

## 2022-07-11 ENCOUNTER — Telehealth: Payer: Self-pay

## 2022-07-11 ENCOUNTER — Other Ambulatory Visit (HOSPITAL_COMMUNITY)
Admission: RE | Admit: 2022-07-11 | Discharge: 2022-07-11 | Disposition: A | Discharge status: Home / Self Care | Payer: Medicare Other | Source: Ambulatory Visit | Attending: Internal Medicine | Admitting: Internal Medicine

## 2022-07-11 DIAGNOSIS — Z01818 Encounter for other preprocedural examination: Secondary | ICD-10-CM

## 2022-07-11 DIAGNOSIS — Z79899 Other long term (current) drug therapy: Secondary | ICD-10-CM

## 2022-07-11 LAB — COMPREHENSIVE METABOLIC PANEL
ALT: 19 U/L (ref 0–44)
AST: 24 U/L (ref 15–41)
Albumin: 4.1 g/dL (ref 3.5–5.0)
Alkaline Phosphatase: 47 U/L (ref 38–126)
Anion gap: 10 (ref 5–15)
BUN: 20 mg/dL (ref 8–23)
CO2: 25 mmol/L (ref 22–32)
Calcium: 9.2 mg/dL (ref 8.9–10.3)
Chloride: 103 mmol/L (ref 98–111)
Creatinine, Ser: 1.29 mg/dL — ABNORMAL HIGH (ref 0.61–1.24)
GFR, Estimated: 58 mL/min — ABNORMAL LOW (ref >60)
Glucose, Bld: 116 mg/dL — ABNORMAL HIGH (ref 70–99)
Potassium: 4 mmol/L (ref 3.5–5.1)
Sodium: 138 mmol/L (ref 135–145)
Total Bilirubin: 0.6 mg/dL (ref 0.3–1.2)
Total Protein: 7.9 g/dL (ref 6.5–8.1)

## 2022-07-11 NOTE — Telephone Encounter (Signed)
Wife notified and verbalized understanding. Wife had no questions at this time. Pt is scheduled for LHC w/ Dr. Martinique on 1/25 @ 10:30am.

## 2022-07-11 NOTE — Telephone Encounter (Signed)
-----  Message from Chalmers Guest, MD sent at 07/11/2022 12:21 PM EST ----- Serum creatinine is stable, 1.29 today and 1.28 from 11/23. He will benefit from [facility] due to symptoms of DOE and positive stress test. I explained the risk and benefits of LHC to both patient and his wife on 07/10/2022 during my clinic visit. Patient had no questions and agreed to proceed with the procedure. Please schedule him for LHC at his earliest convenience. I addended my note to reflect the current plan. He will need pre and postprocedure hydration.

## 2022-07-15 ENCOUNTER — Other Ambulatory Visit (HOSPITAL_COMMUNITY)
Admission: RE | Admit: 2022-07-15 | Discharge: 2022-07-15 | Disposition: A | Discharge status: Home / Self Care | Payer: Medicare Other | Source: Ambulatory Visit | Attending: Internal Medicine | Admitting: Internal Medicine

## 2022-07-15 ENCOUNTER — Telehealth: Payer: Self-pay | Admitting: *Deleted

## 2022-07-15 ENCOUNTER — Other Ambulatory Visit (HOSPITAL_COMMUNITY)
Admission: RE | Admit: 2022-07-15 | Discharge: 2022-07-15 | Disposition: A | Discharge status: Home / Self Care | Payer: Medicare Other | Source: Ambulatory Visit | Attending: Nephrology | Admitting: Nephrology

## 2022-07-15 DIAGNOSIS — I5032 Chronic diastolic (congestive) heart failure: Secondary | ICD-10-CM | POA: Diagnosis not present

## 2022-07-15 DIAGNOSIS — Z01818 Encounter for other preprocedural examination: Secondary | ICD-10-CM | POA: Insufficient documentation

## 2022-07-15 DIAGNOSIS — D631 Anemia in chronic kidney disease: Secondary | ICD-10-CM | POA: Diagnosis not present

## 2022-07-15 DIAGNOSIS — I129 Hypertensive chronic kidney disease with stage 1 through stage 4 chronic kidney disease, or unspecified chronic kidney disease: Secondary | ICD-10-CM | POA: Diagnosis not present

## 2022-07-15 DIAGNOSIS — R9439 Abnormal result of other cardiovascular function study: Secondary | ICD-10-CM | POA: Diagnosis not present

## 2022-07-15 DIAGNOSIS — N1831 Chronic kidney disease, stage 3a: Secondary | ICD-10-CM | POA: Insufficient documentation

## 2022-07-15 LAB — HEMOGLOBIN A1C
Hgb A1c MFr Bld: 4.9 % (ref 4.8–5.6)
Mean Plasma Glucose: 93.93 mg/dL

## 2022-07-15 LAB — CBC
HCT: 45.2 % (ref 39.0–52.0)
Hemoglobin: 14.8 g/dL (ref 13.0–17.0)
MCH: 28.6 pg (ref 26.0–34.0)
MCHC: 32.7 g/dL (ref 30.0–36.0)
MCV: 87.4 fL (ref 80.0–100.0)
Platelets: 190 10*3/uL (ref 150–400)
RBC: 5.17 MIL/uL (ref 4.22–5.81)
RDW: 13.7 % (ref 11.5–15.5)
WBC: 4.8 10*3/uL (ref 4.0–10.5)
nRBC: 0 % (ref 0.0–0.2)

## 2022-07-15 NOTE — Telephone Encounter (Signed)
Cardiac Catheterization scheduled at [facility] for: Thursday July 17, 2022 10:30 AM Arrival time and place: [facility] Main Entrance A at: 5:30 AM-per Dr Dellia Cloud  Nothing to eat after midnight prior to procedure, clear liquids until 5 AM day of procedure.  Medication instructions: -Usual morning medications can be taken with sips of water including aspirin 81 mg.  Confirmed patient has responsible adult to drive home post procedure and be with patient first 24 hours after arriving home.  Patient reports no new symptoms concerning for COVID-19 in the past 10 days.  Reviewed procedure instructions with patient's wife (DPR), Adela Lank.

## 2022-07-17 ENCOUNTER — Encounter (HOSPITAL_COMMUNITY)
Admission: RE | Disposition: A | Discharge status: Home / Self Care | Payer: Self-pay | Source: Ambulatory Visit | Attending: Cardiology

## 2022-07-17 ENCOUNTER — Encounter (HOSPITAL_COMMUNITY): Payer: Self-pay | Admitting: Cardiology

## 2022-07-17 ENCOUNTER — Ambulatory Visit (HOSPITAL_COMMUNITY)
Admission: RE | Admit: 2022-07-17 | Discharge: 2022-07-17 | Disposition: A | Discharge status: Home / Self Care | Payer: Medicare Other | Source: Ambulatory Visit | Attending: Cardiology | Admitting: Cardiology

## 2022-07-17 ENCOUNTER — Other Ambulatory Visit: Payer: Self-pay

## 2022-07-17 DIAGNOSIS — G4733 Obstructive sleep apnea (adult) (pediatric): Secondary | ICD-10-CM | POA: Diagnosis not present

## 2022-07-17 DIAGNOSIS — I2510 Atherosclerotic heart disease of native coronary artery without angina pectoris: Secondary | ICD-10-CM | POA: Diagnosis not present

## 2022-07-17 DIAGNOSIS — I2584 Coronary atherosclerosis due to calcified coronary lesion: Secondary | ICD-10-CM | POA: Insufficient documentation

## 2022-07-17 DIAGNOSIS — E785 Hyperlipidemia, unspecified: Secondary | ICD-10-CM | POA: Diagnosis not present

## 2022-07-17 DIAGNOSIS — R0609 Other forms of dyspnea: Secondary | ICD-10-CM

## 2022-07-17 DIAGNOSIS — N189 Chronic kidney disease, unspecified: Secondary | ICD-10-CM | POA: Diagnosis not present

## 2022-07-17 DIAGNOSIS — Z87891 Personal history of nicotine dependence: Secondary | ICD-10-CM | POA: Insufficient documentation

## 2022-07-17 DIAGNOSIS — I129 Hypertensive chronic kidney disease with stage 1 through stage 4 chronic kidney disease, or unspecified chronic kidney disease: Secondary | ICD-10-CM | POA: Diagnosis not present

## 2022-07-17 DIAGNOSIS — Z7982 Long term (current) use of aspirin: Secondary | ICD-10-CM | POA: Insufficient documentation

## 2022-07-17 DIAGNOSIS — Z79899 Other long term (current) drug therapy: Secondary | ICD-10-CM | POA: Insufficient documentation

## 2022-07-17 HISTORY — PX: LEFT HEART CATH AND CORONARY ANGIOGRAPHY: CATH118249

## 2022-07-17 SURGERY — LEFT HEART CATH AND CORONARY ANGIOGRAPHY
Anesthesia: LOCAL

## 2022-07-17 MED ORDER — VERAPAMIL HCL 2.5 MG/ML IV SOLN
INTRAVENOUS | Status: DC | PRN
  Administered 2022-07-17: 10 mL via INTRA_ARTERIAL

## 2022-07-17 MED ORDER — HEPARIN SODIUM (PORCINE) 1000 UNIT/ML IJ SOLN
INTRAMUSCULAR | Status: DC | PRN
  Administered 2022-07-17: 5000 [IU] via INTRAVENOUS

## 2022-07-17 MED ORDER — SODIUM CHLORIDE 0.9% FLUSH: 3.0000 mL | Freq: Two times a day (BID) | INTRAVENOUS | Status: DC

## 2022-07-17 MED ORDER — ASPIRIN 81 MG PO CHEW: 81.0000 mg | CHEWABLE_TABLET | ORAL | Status: DC

## 2022-07-17 MED ORDER — HEPARIN SODIUM (PORCINE) 1000 UNIT/ML IJ SOLN
INTRAMUSCULAR | Status: AC
  Filled 2022-07-17: qty 10

## 2022-07-17 MED ORDER — LIDOCAINE HCL (PF) 1 % IJ SOLN
INTRAMUSCULAR | Status: AC
  Filled 2022-07-17: qty 30

## 2022-07-17 MED ORDER — SODIUM CHLORIDE 0.9 % WEIGHT BASED INFUSION: 1.0000 mL/kg/h | INTRAVENOUS | Status: DC

## 2022-07-17 MED ORDER — HEPARIN (PORCINE) IN NACL 1000-0.9 UT/500ML-% IV SOLN
INTRAVENOUS | Status: DC | PRN
  Administered 2022-07-17 (×2): 500 mL

## 2022-07-17 MED ORDER — SODIUM CHLORIDE 0.9% FLUSH: 3.0000 mL | INTRAVENOUS | Status: DC | PRN

## 2022-07-17 MED ORDER — ONDANSETRON HCL 4 MG/2ML IJ SOLN: 4.0000 mg | Freq: Four times a day (QID) | INTRAMUSCULAR | Status: DC | PRN

## 2022-07-17 MED ORDER — SODIUM CHLORIDE 0.9 % WEIGHT BASED INFUSION
3.0000 mL/kg/h | INTRAVENOUS | Status: AC
  Administered 2022-07-17: 3 mL/kg/h via INTRAVENOUS

## 2022-07-17 MED ORDER — FENTANYL CITRATE (PF) 100 MCG/2ML IJ SOLN
INTRAMUSCULAR | Status: DC | PRN
  Administered 2022-07-17: 25 ug via INTRAVENOUS

## 2022-07-17 MED ORDER — FENTANYL CITRATE (PF) 100 MCG/2ML IJ SOLN
INTRAMUSCULAR | Status: AC
  Filled 2022-07-17: qty 2

## 2022-07-17 MED ORDER — MIDAZOLAM HCL 2 MG/2ML IJ SOLN
INTRAMUSCULAR | Status: AC
  Filled 2022-07-17: qty 2

## 2022-07-17 MED ORDER — IOHEXOL 350 MG/ML SOLN
INTRAVENOUS | Status: DC | PRN
  Administered 2022-07-17: 45 mL

## 2022-07-17 MED ORDER — SODIUM CHLORIDE 0.9 % IV SOLN: 250.0000 mL | INTRAVENOUS | Status: DC | PRN

## 2022-07-17 MED ORDER — LIDOCAINE HCL (PF) 1 % IJ SOLN
INTRAMUSCULAR | Status: DC | PRN
  Administered 2022-07-17: 2 mL

## 2022-07-17 MED ORDER — HEPARIN (PORCINE) IN NACL 1000-0.9 UT/500ML-% IV SOLN
INTRAVENOUS | Status: AC
  Filled 2022-07-17: qty 1000

## 2022-07-17 MED ORDER — VERAPAMIL HCL 2.5 MG/ML IV SOLN
INTRAVENOUS | Status: AC
  Filled 2022-07-17: qty 2

## 2022-07-17 MED ORDER — ACETAMINOPHEN 325 MG PO TABS: 650.0000 mg | ORAL_TABLET | ORAL | Status: DC | PRN

## 2022-07-17 MED ORDER — MIDAZOLAM HCL 2 MG/2ML IJ SOLN
INTRAMUSCULAR | Status: DC | PRN
  Administered 2022-07-17: 1 mg via INTRAVENOUS

## 2022-07-17 SURGICAL SUPPLY — 11 items
CATH 5FR JL3.5 JR4 ANG PIG MP (CATHETERS) IMPLANT
DEVICE RAD COMP TR BAND LRG (VASCULAR PRODUCTS) IMPLANT
GLIDESHEATH SLEND SS 6F .021 (SHEATH) IMPLANT
GUIDEWIRE INQWIRE 1.5J.035X260 (WIRE) IMPLANT
INQWIRE 1.5J .035X260CM (WIRE) ×1
KIT HEART LEFT (KITS) ×1 IMPLANT
PACK CARDIAC CATHETERIZATION (CUSTOM PROCEDURE TRAY) ×1 IMPLANT
PROTECTION STATION PRESSURIZED (MISCELLANEOUS) ×1
STATION PROTECTION PRESSURIZED (MISCELLANEOUS) IMPLANT
TRANSDUCER W/STOPCOCK (MISCELLANEOUS) ×1 IMPLANT
TUBING CIL FLEX 10 FLL-RA (TUBING) ×1 IMPLANT

## 2022-07-17 NOTE — Discharge Instructions (Signed)

## 2022-07-17 NOTE — Interval H&P Note (Signed)
History and Physical Interval Note:  07/17/2022 9:24 AM  Dakota Thompson  has presented today for surgery, with the diagnosis of DOE, abnormal stress test.  The various methods of treatment have been discussed with the patient and family. After consideration of risks, benefits and other options for treatment, the patient has consented to  Procedure(s): LEFT HEART CATH AND CORONARY ANGIOGRAPHY (N/A) as a surgical intervention.  The patient's history has been reviewed, patient examined, no change in status, stable for surgery.  I have reviewed the patient's chart and labs.  Questions were answered to the patient's satisfaction.   Cath Lab Visit (complete for each Cath Lab visit)  Clinical Evaluation Leading to the Procedure:   ACS: No.  Non-ACS:    Anginal Classification: CCS III  Anti-ischemic medical therapy: No Therapy  Non-Invasive Test Results: Intermediate-risk stress test findings: cardiac mortality 1-3%/year  Prior CABG: No previous CABG        Collier Salina [employer] 07/17/2022 9:25 AM

## 2022-07-24 ENCOUNTER — Telehealth: Payer: Self-pay | Admitting: *Deleted

## 2022-07-24 NOTE — Telephone Encounter (Signed)
   Pre-operative Risk Assessment    Patient Name: [name]  DOB: 1946-02-23 MRN: [id number]      Request for Surgical Clearance    Procedure:  left shoulder scope with RCR  Date of Surgery:  Clearance 07/29/22                                 Surgeon:  Dr. Justice Britain Surgeon's Group or Practice Name:  [facility] Phone number:  [phone] Fax number:  [phone] attn: Glendale Chard   Type of Clearance Requested:   - Medical  - Pharmacy:  Hold Aspirin     Type of Anesthesia:  General    Additional requests/questions:    SignedSilverio Lay   07/24/2022, 4:50 PM

## 2022-07-25 NOTE — Telephone Encounter (Signed)
   Patient Name: Dakota Thompson  DOB: 09-10-1945 MRN: [id number]  Primary Cardiologist: Chalmers Guest, MD  Chart reviewed as part of pre-operative protocol coverage. Given past medical history and time since last visit, based on ACC/AHA guidelines, Dakota Thompson is at acceptable risk for the planned procedure without further cardiovascular testing.   Patient recently underwent cardiac catheterization that showed minimal plaque.  He is okay to stop aspirin and over-the-counter fish oil/flaxseed oil prior to the procedure.  According to his wife, he has stopped those medications since yesterday.  He is a low risk candidate for the intended procedure.  The patient was advised that if he develops new symptoms prior to surgery to contact our office to arrange for a follow-up visit, and he verbalized understanding.  I will route this recommendation to the requesting party via Epic fax function and remove from pre-op pool.  Please call with questions.  [place], [place] 07/25/2022, 8:24 AM

## 2022-10-17 ENCOUNTER — Ambulatory Visit: Payer: Medicare Other | Admitting: Internal Medicine

## 2022-11-13 ENCOUNTER — Encounter: Payer: Self-pay | Admitting: *Deleted

## 2023-01-14 NOTE — Progress Notes (Unsigned)
PATIENT: Dakota Thompson DOB: 1945-10-02  REASON FOR VISIT: follow up HISTORY FROM: patient  No chief complaint on file.  HISTORY OF PRESENT ILLNESS:  01/14/23 ALL: [name] returns for follow up for OSA on BiPAP.   01/08/2022 ALL: [name] returns for follow up for OSA on BIPAP. He continues to do well on therapy. He received replacement machine from [facility] a few months ago. No concerns with machine or supplies. He is sleeping well. He is following with nephrology for CKD. BP meds have been discontinued for lower BP. He does report feeling a little more tired, recently. No exercise. Blood sugars seem stable.     01/08/2021 ALL:  [name] is a 77 y.o. male here today for follow up for OSA on BiPAP. He continues to do well on his new machine. He has not received a replacement for recalled machine but denies any concerns. He is using BiPAP nightly.   Recent compliance data shows he used BiPAP 30/30 days for greater than 4 hours, compliance 100%. Residual AHI was 2 on 12/8cmH20. No significant leak was noted.    HISTORY: (copied from Dr Teofilo Pod previous notes)  [name] is a 77 year old right-handed gentleman with an underlying medical history of neuropathy, obesity and restless leg syndrome and mild memory loss, Who presents for follow-up consultation of his obstructive sleep apnea, on treatment with BiPAP therapy.  The patient is unaccompanied today.  I last saw him on 09/30/2018 in a virtual visit.  He indicated that he was interested in a new machine.  He was advised to call back so we could prescribe a new machine through his DME company located in [place], [place].  He was otherwise advised to follow-up routinely in 1 year.   Today, 10/03/19: I was not able to review his BiPAP compliance data as he did not bring his machine. He reports compliance with his machine. He would be willing to take his machine to his DME for a download in the next 2-3 days. He would like to get a new  machine. He has continued to benefit from treatment and is motivated to continue. He has received his Covid vaccine.    REVIEW OF SYSTEMS: Out of a complete 14 system review of symptoms, the patient complains only of the following symptoms, neuropathy, fatigue and all other reviewed systems are negative.  ESS: 5/24 FSS: 36/63  ALLERGIES: No Known Allergies  HOME MEDICATIONS: Outpatient Medications Prior to Visit  Medication Sig Dispense Refill   aspirin EC 81 MG tablet Take 81 mg by mouth daily.     Coenzyme Q10 200 MG capsule Take 200 mg by mouth every evening.     desloratadine (CLARINEX) 5 MG tablet Take 5 mg by mouth daily.     docusate sodium (COLACE) 100 MG capsule Take 200 mg by mouth every morning.     Flaxseed, Linseed, (FLAX SEED OIL) 1000 MG CAPS Take 1,000 capsules by mouth 2 (two) times daily.     gemfibrozil (LOPID) 600 MG tablet Take 600 mg by mouth 2 (two) times daily.     Multiple Vitamin (MULTIVITAMIN WITH MINERALS) TABS tablet Take 1 tablet by mouth daily. 50+     Omega-3 Fatty Acids (FISH OIL) 1200 MG CAPS Take 1,200-2,400 mg by mouth See admin instructions. 2400 mg in the morning & 1200 mg in the evening.      oxymetazoline (AFRIN) 0.05 % nasal spray Place 1 spray into both nostrils 2 (two) times daily as needed  for congestion.     pantoprazole (PROTONIX) 40 MG tablet Take 40 mg by mouth 2 (two) times daily.     pramipexole (MIRAPEX) 0.5 MG tablet Take 0.5 mg by mouth at bedtime.     pravastatin (PRAVACHOL) 40 MG tablet Take 40 mg by mouth at bedtime.     Probiotic Product (PROBIOTIC DAILY PO) Take 250 mg by mouth daily. saccharomyces boulardii     traZODone (DESYREL) 100 MG tablet Take 100 mg by mouth 2 (two) times daily.     No facility-administered medications prior to visit.    PAST MEDICAL HISTORY: 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 most  recently, neuropathy   GERD (gastroesophageal reflux disease)    Hx of adenomatous colonic polyps    tubulovillous adenoma   Hypertension    Pancytopenia    mild   Pancytopenia 02/27/2011   Renal insufficiency    Restless leg syndrome    Sleep apnea    wears CPAP/BIPAP every night   Thyroid disease    Viral syndrome    Hx   Viral syndrome 02/27/2011    PAST SURGICAL HISTORY: Past Surgical History:  Procedure Laterality Date   BIOPSY  12/31/2017   Procedure: BIOPSY;  Surgeon: Corbin Ade, MD;  [facility];  Service: Endoscopy;;  gastric biopsy    COLONOSCOPY  01/02/2010   RMR: normal rectum and colon. marginal prep compromised exam.    COLONOSCOPY N/A 03/15/2015   Dr. Jena Gauss: tubular adenoma, colonic diverticulosis, surveillance due 2021   COLONOSCOPY WITH PROPOFOL N/A 12/31/2017   Procedure: COLONOSCOPY WITH PROPOFOL;  Surgeon: Corbin Ade, MD;  [facility];  Service: Endoscopy;  Laterality: N/A;  2:00pm   ESOPHAGOGASTRODUODENOSCOPY N/A 03/15/2015   2 cm hiatal hernia, otherwise normal   ESOPHAGOGASTRODUODENOSCOPY (EGD) WITH PROPOFOL N/A 12/31/2017   Procedure: ESOPHAGOGASTRODUODENOSCOPY (EGD) WITH PROPOFOL;  Surgeon: Corbin Ade, MD;  [facility];  Service: Endoscopy;  Laterality: N/A;   EYE SURGERY     bilateral cataracts   GANGLION CYST EXCISION Right    knee cartilage repair Bilateral 01/31/2011   LEFT HEART CATH AND CORONARY ANGIOGRAPHY N/A 07/17/2022   Procedure: LEFT HEART CATH AND CORONARY ANGIOGRAPHY;  Surgeon: Swaz[place], Peter M, MD;  Location: [facility] INVASIVE CV LAB;  Service: Cardiovascular;  Laterality: N/A;   NASAL SINUS SURGERY     removal skin cancer      nose   TOTAL KNEE ARTHROPLASTY Right 10/03/2016   Procedure: RIGHT TOTAL KNEE ARTHROPLASTY;  Surgeon: Eugenia Mcalpine, MD;  [facility];  Service: Orthopedics;  Laterality: Right;    FAMILY HISTORY: Family History  Problem Relation Age of Onset   Colon cancer  Brother        older than 4    Colon cancer Sister        older than 21     SOCIAL HISTORY: Social History   Socioeconomic History   Marital status: Married    Spouse name: Scientist, product/process development    Number of children: 2   Years of education: Not on file   Highest education level: Not on file  Occupational History   Occupation: Retired    Associate Professor: RETIRED  Tobacco Use   Smoking status: Former    Current packs/day: 0.00    Average packs/day: 3.0 packs/day for 28.0 years (  84.0 ttl pk-yrs)    Types: Cigarettes    Start date: 05/22/1953    Quit date: 05/22/1981    Years since quitting: 41.6   Smokeless tobacco: Never  Vaping Use   Vaping status: Never Used  Substance and Sexual Activity   Alcohol use: Yes    Comment: rare   Drug use: No   Sexual activity: Yes  Other Topics Concern   Not on file  Social History Narrative   Patient lives at home with his wife.    Patient has 2 children.    Patient is retired.    Social Determinants of Health   Financial Resource Strain: Not on file  Food Insecurity: Not on file  Transportation Needs: Not on file  Physical Activity: Not on file  Stress: Not on file  Social Connections: Not on file  Intimate Partner Violence: Not on file     PHYSICAL EXAM  There were no vitals filed for this visit.   There is no height or weight on file to calculate BMI.  Generalized: Well developed, in no acute distress  Cardiology: normal rate and rhythm, no murmur noted Respiratory: clear to auscultation bilaterally  Neurological examination  Mentation: Alert oriented to time, place, history taking. Follows all commands speech and language fluent Cranial nerve II-XII: Pupils were equal round reactive to light. Extraocular movements were full, visual field were full  Motor: The motor testing reveals 5 over 5 strength of all 4 extremities. Good symmetric motor tone is noted throughout.  Gait and station: Gait is normal.    DIAGNOSTIC DATA (LABS,  IMAGING, TESTING) - I reviewed patient records, labs, notes, testing and imaging myself where available.     09/18/2016    4:22 PM  MMSE - Mini Mental State Exam  Orientation to time 5  Orientation to Place 5  Registration 3  Attention/ Calculation 5  Recall 2  Language- name 2 objects 2  Language- repeat 1  Language- follow 3 step command 3  Language- read & follow direction 1  Write a sentence 1  Copy design 1  Total score 29     Lab Results  Component Value Date   WBC 4.8 07/15/2022   HGB 14.8 07/15/2022   HCT 45.2 07/15/2022   MCV 87.4 07/15/2022   PLT 190 07/15/2022      Component Value Date/Time   NA 138 07/11/2022 0855   K 4.0 07/11/2022 0855   CL 103 07/11/2022 0855   CO2 25 07/11/2022 0855   GLUCOSE 116 (H) 07/11/2022 0855   BUN 20 07/11/2022 0855   CREATININE 1.29 (H) 07/11/2022 0855   CALCIUM 9.2 07/11/2022 0855   PROT 7.9 07/11/2022 0855   ALBUMIN 4.1 07/11/2022 0855   AST 24 07/11/2022 0855   ALT 19 07/11/2022 0855   ALKPHOS 47 07/11/2022 0855   BILITOT 0.6 07/11/2022 0855   GFRNONAA 58 (L) 07/11/2022 0855   GFRAA >60 12/25/2017 1417   No results found for: "CHOL", "HDL", "LDLCALC", "LDLDIRECT", "TRIG", "CHOLHDL" Lab Results  Component Value Date   HGBA1C 4.9 07/15/2022   Lab Results  Component Value Date   VITAMINB12 >1999 (H) 02/24/2014   Lab Results  Component Value Date   TSH 1.590 02/24/2014     ASSESSMENT AND PLAN 77 y.o. year old male  has a past medical history of Anemia, Anxiety, Cancer ([facility]), Charcot-Marie-Tooth disease, GERD (gastroesophageal reflux disease), adenomatous colonic polyps, Hypertension, Pancytopenia, Pancytopenia (02/27/2011), Renal insufficiency, Restless leg syndrome, Sleep apnea, Thyroid disease,  Viral syndrome, and Viral syndrome (02/27/2011). here with   No diagnosis found.   AYDRIEN FROMAN is doing well on BiPAP therapy. Compliance report reveals excellent. He was encouraged to continue using BiPAP nightly  and for greater than 4 hours each night. We will update supply orders as indicated. Risks of untreated sleep apnea review and education materials provided. Healthy lifestyle habits encouraged. Consider adding daily exercise. Continue monitoring BP and close follow up with nephrology/PCP. He will follow up in 1 year, sooner if needed. He verbalizes understanding and agreement with this plan.    No orders of the defined types were placed in this encounter.     No orders of the defined types were placed in this encounter.    Shawnie Dapper, FNP-C 01/14/2023, 4:09 PM [facility] [address] [place], [place] [address] [phone]

## 2023-01-14 NOTE — Patient Instructions (Signed)

## 2023-01-15 ENCOUNTER — Encounter: Payer: Self-pay | Admitting: Family Medicine

## 2023-01-15 ENCOUNTER — Ambulatory Visit: Payer: Medicare Other | Admitting: Family Medicine

## 2023-01-15 VITALS — BP 121/75 | HR 62 | Ht 70.0 in | Wt 237.0 lb

## 2023-01-15 DIAGNOSIS — G4733 Obstructive sleep apnea (adult) (pediatric): Secondary | ICD-10-CM

## 2023-01-15 NOTE — Progress Notes (Signed)
Supply order faxed to [facility] Fax # [phone]  Confirmation received

## 2023-02-07 NOTE — Progress Notes (Signed)
GI Office Note    Referring Provider: Elfredia Nevins, MD Primary Care Physician:  Elfredia Nevins, MD  Primary Gastroenterologist: Roetta Sessions, MD    Chief Complaint   Chief Complaint  Patient presents with   Colonoscopy    Colonoscopy screening. Has a hemorrhoid      History of Present Illness   Dakota Thompson is a 77 y.o. male presenting today for surveillance colonoscopy. 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 Last colonoscopy in 2019 without polyps, advised five year surveillance exam. He also has h/o chronic GERD/constipation.   Labs 08/2022: A1c 5.4, white blood cell count 3900, hemoglobin 14.5, platelets 177,000, BUN 15, creatinine 1.18.  For the past one year, large hemorrhoid bothering him. Feels like it is blocking things.  Not really having any significant pain with it but does have some bleeding at least once per week.  Sometimes messes up his underwear or shorts.  Has some issues cleaning up after a BM, has had some fecal seepage afterwards.  For the most part he has been having a bowel movement most days but he feels like he has to strain and sometimes the stool is hard.  He has been back on Benefiber 1 tablespoon daily for the past 3 to 4 weeks.  He also went from 2-3 Colace tablets daily 4 days ago.  Really has not noticed any significant change.  He also takes gummy probiotics.    On Sunday he noticed some crampy right upper quadrant discomfort, colicky in nature but not really painful.  This occurred about 30 minutes after eating a double cheeseburger.  Later that day he went to [employer] and had some seafood and developed right upper quadrant pain, mild and short-lived.  He wonders if his gallbladder is an issue.  At this time he wants to monitor symptoms but will call if he decides he wants an ultrasound.  Heartburn generally well-controlled.  No dysphagia.  No unintentional weight loss.    Colonoscopy 12/2017: -Grade IV internal hemorrhoids and  moderate/large external hemorrhoids -diverticulosis -recommended 5 year surveillance  EGD 12/2017: -normal esophagus -erythematous mucosa in stomach s/p bx, mild chronic inflammaiton with no h.pylori -Normal second portion of duodenum and third portion  Medications   Current Outpatient Medications  Medication Sig Dispense Refill   aspirin EC 81 MG tablet Take 81 mg by mouth daily.     carvedilol (COREG) 6.25 MG tablet Take 6.25 mg by mouth 2 (two) times daily with a meal.     Coenzyme Q10 200 MG capsule Take 200 mg by mouth every evening.     desloratadine (CLARINEX) 5 MG tablet Take 5 mg by mouth daily.     docusate sodium (COLACE) 100 MG capsule Take 200 mg by mouth every morning.     Flaxseed, Linseed, (FLAX SEED OIL) 1000 MG CAPS Take 1,000 capsules by mouth 2 (two) times daily.     GABAPENTIN PO Take 300 mg by mouth. Takes 1 capsule about 4  or 5 nights per month if needed     Multiple Vitamin (MULTIVITAMIN WITH MINERALS) TABS tablet Take 1 tablet by mouth daily. 50+     Omega-3 Fatty Acids (FISH OIL) 1200 MG CAPS Take 1,200-2,400 mg by mouth See admin instructions. 2400 mg in the morning & 1200 mg in the evening.      oxymetazoline (AFRIN) 0.05 % nasal spray Place 1 spray into both nostrils 2 (two) times daily as needed for congestion.  pantoprazole (PROTONIX) 40 MG tablet Take 40 mg by mouth 2 (two) times daily.     pramipexole (MIRAPEX) 0.5 MG tablet Take 0.5 mg by mouth at bedtime.     pravastatin (PRAVACHOL) 40 MG tablet Take 40 mg by mouth at bedtime.     Probiotic Product (PROBIOTIC DAILY PO) Take 250 mg by mouth daily. saccharomyces boulardii     traZODone (DESYREL) 100 MG tablet Take 100 mg by mouth 2 (two) times daily.     Wheat Dextrin (BENEFIBER DRINK MIX PO) Take by mouth.     gemfibrozil (LOPID) 600 MG tablet Take 600 mg by mouth 2 (two) times daily.     No current facility-administered medications for this visit.    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, neuropathy   GERD (gastroesophageal reflux disease)    Hx of adenomatous colonic polyps    tubulovillous adenoma   Hypertension    Pancytopenia    mild   Pancytopenia 02/27/2011   Renal insufficiency    Restless leg syndrome    Sleep apnea    wears CPAP/BIPAP every night   Thyroid disease    Viral syndrome    Hx   Viral syndrome 02/27/2011    Past Surgical History   Past Surgical History:  Procedure Laterality Date   BIOPSY  12/31/2017   Procedure: BIOPSY;  Surgeon: Corbin Ade, MD;  Location: [facility];  Service: Endoscopy;;  gastric biopsy    COLONOSCOPY  01/02/2010   RMR: normal rectum and colon. marginal prep compromised exam.    COLONOSCOPY N/A 03/15/2015   Dr. Jena Gauss: tubular adenoma, colonic diverticulosis, surveillance due 2021   COLONOSCOPY WITH PROPOFOL N/A 12/31/2017   Procedure: COLONOSCOPY WITH PROPOFOL;  Surgeon: Corbin Ade, MD;  Location: [facility];  Service: Endoscopy;  Laterality: N/A;  2:00pm   ESOPHAGOGASTRODUODENOSCOPY N/A 03/15/2015   2 cm hiatal hernia, otherwise normal   ESOPHAGOGASTRODUODENOSCOPY (EGD) WITH PROPOFOL N/A 12/31/2017   Procedure: ESOPHAGOGASTRODUODENOSCOPY (EGD) WITH PROPOFOL;  Surgeon: Corbin Ade, MD;  Location: [facility];  Service: Endoscopy;  Laterality: N/A;   EYE SURGERY     bilateral cataracts   GANGLION CYST EXCISION Right    knee cartilage repair Bilateral 01/31/2011   LEFT HEART CATH AND CORONARY ANGIOGRAPHY N/A 07/17/2022   Procedure: LEFT HEART CATH AND CORONARY ANGIOGRAPHY;  Surgeon: Swaziland, Peter M, MD;  Location: [facility];  Service: Cardiovascular;  Laterality: N/A;   NASAL SINUS SURGERY     removal skin cancer      nose    TOTAL KNEE ARTHROPLASTY Right 10/03/2016   Procedure: RIGHT TOTAL KNEE ARTHROPLASTY;  Surgeon: Eugenia Mcalpine, MD;  Location: [facility];  Service: Orthopedics;  Laterality: Right;    Past Family History   Family History  Problem Relation Age of Onset   Colon cancer Brother        older than 24    Colon cancer Sister        older than 69     Past Social History   Social History   Socioeconomic History   Marital status: Married    Spouse name: [name]  Number of children: 2   Years of education: Not on file   Highest education level: Not on file  Occupational History   Occupation: Retired    Associate Professor: RETIRED  Tobacco Use   Smoking status: Former    Current packs/day: 0.00    Average packs/day: 3.0 packs/day for 28.0 years (84.0 ttl pk-yrs)    Types: Cigarettes    Start date: 05/22/1953    Quit date: 05/22/1981    Years since quitting: 41.7   Smokeless tobacco: Never  Vaping Use   Vaping status: Never Used  Substance and Sexual Activity   Alcohol use: Yes    Comment: rare   Drug use: No   Sexual activity: Yes  Other Topics Concern   Not on file  Social History Narrative   Patient lives at home with his wife.    Patient has 2 children.    Patient is retired.    Social Determinants of Health   Financial Resource Strain: Not on file  Food Insecurity: Not on file  Transportation Needs: Not on file  Physical Activity: Not on file  Stress: Not on file  Social Connections: Not on file  Intimate Partner Violence: Not on file    Review of Systems   General: Negative for anorexia, weight loss, fever, chills, fatigue, weakness. Eyes: Negative for vision changes.  ENT: Negative for hoarseness, difficulty swallowing , nasal congestion. CV: Negative for chest pain, angina, palpitations, dyspnea on exertion, peripheral edema.  Respiratory: Negative for dyspnea at rest, dyspnea on exertion, cough, sputum, wheezing.  GI: See history of present illness. GU:  Negative  for dysuria, hematuria, urinary incontinence, urinary frequency, nocturnal urination.  MS: Negative for joint pain, low back pain.  Derm: Negative for rash or itching.  Neuro: Negative for weakness, abnormal sensation, seizure, frequent headaches, memory loss,  confusion.  Psych: Negative for anxiety, depression, suicidal ideation, hallucinations.  Endo: Negative for unusual weight change.  Heme: Negative for bruising or bleeding. Allergy: Negative for rash or hives.  Physical Exam   BP 112/63 (BP Location: Right Arm, Patient Position: Sitting, Cuff Size: Large)   Pulse 63   Temp 97.6 F (36.4 C) (Temporal)   Ht 5\' 10"  (1.778 m)   Wt 241 lb 9.6 oz (109.6 kg)   SpO2 96%   BMI 34.67 kg/m    General: Well-nourished, well-developed in no acute distress.  Head: Normocephalic, atraumatic.   Eyes: Conjunctiva pink, no icterus. Mouth: Oropharyngeal mucosa moist and pink Neck: Supple without thyromegaly, masses, or lymphadenopathy.  Lungs: Clear to auscultation bilaterally.  Heart: Regular rate and rhythm, no murmurs rubs or gallops.  Abdomen: Bowel sounds are normal, nontender, nondistended, no hepatosplenomegaly or masses,  no abdominal bruits or hernia, no rebound or guarding.   Rectal: Not performed Extremities: No lower extremity edema. No clubbing or deformities.  Neuro: Alert and oriented x 4 , grossly normal neurologically.  Skin: Warm and dry, no rash or jaundice.   Psych: Alert and cooperative, normal mood and affect.  Labs   See HPI Imaging Studies   No results found.  Assessment   *H/O adenomatous colon polyps *Hemorrhoids *Constipation *RUQ pain  Due for surveillance colonoscopy. Having some issues with hemorrhoid disease with history of both internal and external hemorrhoids disease. Given prior history of Grade IV internal hemorrhoids he is not a candidate for hemorrhoid banding. If he wants definitive treatment of his hemorrhoids diease, he will require  surgical intervention. With regards to constipation, will optimize bowel regimen.  Recent postprandial RUQ pain suspicious for biliary etiology. Offered RUQ U/S, patient desires to monitor symptoms for now.    PLAN   Start miralax 2 capfules mixed in 12 ounces of water daily until stools are soft. Then continue one capful daily as needed. Continue benefiber.  Stop colace. Colonoscopy with Dr. Jena Gauss. ASA 3.  I have discussed the risks, alternatives, benefits with regards to but not limited to the risk of reaction to medication, bleeding, infection, perforation and the patient is agreeable to proceed. Written consent to be obtained. Dr. Jena Gauss to re-evaluate hemorrhoid disease at time of colonoscopy and make further recommendations for definitive treatment.  Patient to call if he desires RUQ U/S.    Leanna Battles. Melvyn Neth, MHS, PA-C [facility]

## 2023-02-10 ENCOUNTER — Encounter: Payer: Self-pay | Admitting: Gastroenterology

## 2023-02-10 ENCOUNTER — Ambulatory Visit (INDEPENDENT_AMBULATORY_CARE_PROVIDER_SITE_OTHER): Payer: Medicare Other | Admitting: Gastroenterology

## 2023-02-10 VITALS — BP 112/63 | HR 63 | Temp 97.6°F | Ht 70.0 in | Wt 241.6 lb

## 2023-02-10 DIAGNOSIS — K5900 Constipation, unspecified: Secondary | ICD-10-CM | POA: Diagnosis not present

## 2023-02-10 DIAGNOSIS — K649 Unspecified hemorrhoids: Secondary | ICD-10-CM | POA: Insufficient documentation

## 2023-02-10 DIAGNOSIS — Z86010 Personal history of colonic polyps: Secondary | ICD-10-CM | POA: Diagnosis not present

## 2023-02-10 DIAGNOSIS — R1011 Right upper quadrant pain: Secondary | ICD-10-CM

## 2023-02-10 NOTE — Patient Instructions (Addendum)
Start MiraLAX 2 capfuls mixed in 12 ounces of liquid daily until soft stools.  Then continue 1 capful daily as needed to maintain regular soft stools. You can continue Benefiber.  You likely would not need Colace as long as you are taking MiraLAX. Colonoscopy to be scheduled.  See separate instructions.  Dr. Jena Gauss can make further recommendations for definitive treatment of your hemorrhoids based on colonoscopy findings. Please call if you decide you want a scan of your gallbladder.  Please call my CMA Tammy at [phone] if any questions or concerns.   It was a pleasure to see you today.  I truly value your feedback, so please be on the lookout for a survey regarding your visit with me today. I appreciate your time in completing this!

## 2023-03-09 ENCOUNTER — Telehealth: Payer: Self-pay | Admitting: *Deleted

## 2023-03-09 MED ORDER — PEG 3350-KCL-NA BICARB-NACL 420 G PO SOLR: 4000.0000 mL | Freq: Once | ORAL | 0 refills | Status: AC

## 2023-03-09 NOTE — Telephone Encounter (Signed)
Spoke with pt spouse. Scheduled TCS with Dr. Jena Gauss ASA 3 10/11. Aware will send instructions/pre-op via mychart. Rx for prep sent to [facility].

## 2023-03-10 ENCOUNTER — Encounter: Payer: Self-pay | Admitting: *Deleted

## 2023-03-31 NOTE — Patient Instructions (Signed)
Dakota Thompson  03/31/2023     @PREFPERIOPPHARMACY @   Your procedure is scheduled on  04/03/2023.   Report to [facility] at  0815  A.M.   Call this number if you have problems the morning of surgery:  [phone]  If you experience any cold or flu symptoms such as cough, fever, chills, shortness of breath, etc. between now and your scheduled surgery, please notify us at the above number.   Remember:  Do not eat after midnight.   You may drink clear liquids until 0615 am on 04/03/2023.      Clear liquids allowed are:                    Water, Carbonated beverages (diabetics please choose diet or no sugar options), Black Coffee Only (No creamer, milk or cream, including half & half and powdered creamer), and Clear Sports drink (No red color; diabetics please choose diet or no sugar options)    Take these medicines the morning of surgery with A SIP OF WATER                                      carvedilol, pantoprazole.     Do not wear jewelry, make-up or nail polish, including gel polish,  artificial nails, or any other type of covering on natural nails (fingers and  toes).  Do not wear lotions, powders, or perfumes, or deodorant.  Do not shave 48 hours prior to surgery.  Men may shave face and neck.  Do not bring valuables to the hospital.  [facility] is not responsible for any belongings or valuables.  Contacts, dentures or bridgework may not be worn into surgery.  Leave your suitcase in the car.  After surgery it may be brought to your room.  For patients admitted to the hospital, discharge time will be determined by your treatment team.  Patients discharged the day of surgery will not be allowed to drive home and must have someone with them for 24 hours.    Special instructions:    DO NOT smoke tobacco or vape for 24 hours before your procedure.  Please read over the following fact sheets that you were given. Anesthesia Post-op Instructions and Care and  Recovery After Surgery      Colonoscopy, Adult, Care After The following information offers guidance on how to care for yourself after your procedure. Your health care provider may also give you more specific instructions. If you have problems or questions, contact your health care provider. What can I expect after the procedure? After the procedure, it is common to have: A small amount of blood in your stool for 24 hours after the procedure. Some gas. Mild cramping or bloating of your abdomen. Follow these instructions at home: Eating and drinking  Drink enough fluid to keep your urine pale yellow. Follow instructions from your health care provider about eating or drinking restrictions. Resume your normal diet as told by your health care provider. Avoid heavy or fried foods that are hard to digest. Activity Rest as told by your health care provider. Avoid sitting for a long time without moving. Get up to take short walks every 1-2 hours. This is important to improve blood flow and breathing. Ask for help if you feel weak or unsteady. Return to your normal activities as told by your health care  provider. Ask your health care provider what activities are safe for you. Managing cramping and bloating  Try walking around when you have cramps or feel bloated. If directed, apply heat to your abdomen as told by your health care provider. Use the heat source that your health care provider recommends, such as a moist heat pack or a heating pad. Place a towel between your skin and the heat source. Leave the heat on for 20-30 minutes. Remove the heat if your skin turns bright red. This is especially important if you are unable to feel pain, heat, or cold. You have a greater risk of getting burned. General instructions If you were given a sedative during the procedure, it can affect you for several hours. Do not drive or operate machinery until your health care provider says that it is safe. For  the first 24 hours after the procedure: Do not sign important documents. Do not drink alcohol. Do your regular daily activities at a slower pace than normal. Eat soft foods that are easy to digest. Take over-the-counter and prescription medicines only as told by your health care provider. Keep all follow-up visits. This is important. Contact a health care provider if: You have blood in your stool 2-3 days after the procedure. Get help right away if: You have more than a small spotting of blood in your stool. You have large blood clots in your stool. You have swelling of your abdomen. You have nausea or vomiting. You have a fever. You have increasing pain in your abdomen that is not relieved with medicine. These symptoms may be an emergency. Get help right away. Call 911. Do not wait to see if the symptoms will go away. Do not drive yourself to the hospital. Summary After the procedure, it is common to have a small amount of blood in your stool. You may also have mild cramping and bloating of your abdomen. If you were given a sedative during the procedure, it can affect you for several hours. Do not drive or operate machinery until your health care provider says that it is safe. Get help right away if you have a lot of blood in your stool, nausea or vomiting, a fever, or increased pain in your abdomen. This information is not intended to replace advice given to you by your health care provider. Make sure you discuss any questions you have with your health care provider. Document Revised: 07/22/2022 Document Reviewed: 01/30/2021 Elsevier Patient Education  2024 [employer] Monitored Anesthesia Care, Care After The following information offers guidance on how to care for yourself after your procedure. Your health care provider may also give you more specific instructions. If you have problems or questions, contact your health care provider. What can I expect after the procedure? After  the procedure, it is common to have: Tiredness. Little or no memory about what happened during or after the procedure. Impaired judgment when it comes to making decisions. Nausea or vomiting. Some trouble with balance. Follow these instructions at home: For the time period you were told by your health care provider:  Rest. Do not participate in activities where you could fall or become injured. Do not drive or use machinery. Do not drink alcohol. Do not take sleeping pills or medicines that cause drowsiness. Do not make important decisions or sign legal documents. Do not take care of children on your own. Medicines Take over-the-counter and prescription medicines only as told by your health care provider. If you were prescribed antibiotics,  take them as told by your health care provider. Do not stop using the antibiotic even if you start to feel better. Eating and drinking Follow instructions from your health care provider about what you may eat and drink. Drink enough fluid to keep your urine pale yellow. If you vomit: Drink clear fluids slowly and in small amounts as you are able. Clear fluids include water, ice chips, low-calorie sports drinks, and fruit juice that has water added to it (diluted fruit juice). Eat light and bland foods in small amounts as you are able. These foods include bananas, applesauce, rice, lean meats, toast, and crackers. General instructions  Have a responsible adult stay with you for the time you are told. It is important to have someone help care for you until you are awake and alert. If you have sleep apnea, surgery and some medicines can increase your risk for breathing problems. Follow instructions from your health care provider about wearing your sleep device: When you are sleeping. This includes during daytime naps. While taking prescription pain medicines, sleeping medicines, or medicines that make you drowsy. Do not use any products that contain  nicotine or tobacco. These products include cigarettes, chewing tobacco, and vaping devices, such as e-cigarettes. If you need help quitting, ask your health care provider. Contact a health care provider if: You feel nauseous or vomit every time you eat or drink. You feel light-headed. You are still sleepy or having trouble with balance after 24 hours. You get a rash. You have a fever. You have redness or swelling around the IV site. Get help right away if: You have trouble breathing. You have new confusion after you get home. These symptoms may be an emergency. Get help right away. Call 911. Do not wait to see if the symptoms will go away. Do not drive yourself to the hospital. This information is not intended to replace advice given to you by your health care provider. Make sure you discuss any questions you have with your health care provider. Document Revised: 11/04/2021 Document Reviewed: 11/04/2021 Elsevier Patient Education  2024 [employer].

## 2023-04-01 ENCOUNTER — Encounter (HOSPITAL_COMMUNITY)
Admission: RE | Admit: 2023-04-01 | Discharge: 2023-04-01 | Disposition: A | Discharge status: Home / Self Care | Payer: Medicare Other | Source: Ambulatory Visit | Attending: Internal Medicine | Admitting: Internal Medicine

## 2023-04-01 ENCOUNTER — Encounter (HOSPITAL_COMMUNITY): Payer: Self-pay

## 2023-04-01 ENCOUNTER — Other Ambulatory Visit (HOSPITAL_COMMUNITY): Payer: Medicare Other

## 2023-04-01 VITALS — BP 104/62 | HR 56 | Temp 97.8°F | Resp 18 | Ht 70.0 in | Wt 241.6 lb

## 2023-04-01 DIAGNOSIS — D649 Anemia, unspecified: Secondary | ICD-10-CM | POA: Insufficient documentation

## 2023-04-01 DIAGNOSIS — D61818 Other pancytopenia: Secondary | ICD-10-CM | POA: Diagnosis not present

## 2023-04-01 DIAGNOSIS — N289 Disorder of kidney and ureter, unspecified: Secondary | ICD-10-CM | POA: Diagnosis not present

## 2023-04-01 DIAGNOSIS — Z01812 Encounter for preprocedural laboratory examination: Secondary | ICD-10-CM | POA: Insufficient documentation

## 2023-04-01 DIAGNOSIS — Z01818 Encounter for other preprocedural examination: Secondary | ICD-10-CM | POA: Diagnosis present

## 2023-04-01 HISTORY — DX: Chronic sinusitis, unspecified: J32.9

## 2023-04-01 LAB — CBC WITH DIFFERENTIAL/PLATELET
Abs Immature Granulocytes: 0.01 10*3/uL (ref 0.00–0.07)
Basophils Absolute: 0.1 10*3/uL (ref 0.0–0.1)
Basophils Relative: 1 %
Eosinophils Absolute: 0.1 10*3/uL (ref 0.0–0.5)
Eosinophils Relative: 3 %
HCT: 43.1 % (ref 39.0–52.0)
Hemoglobin: 14.6 g/dL (ref 13.0–17.0)
Immature Granulocytes: 0 %
Lymphocytes Relative: 33 %
Lymphs Abs: 1.2 10*3/uL (ref 0.7–4.0)
MCH: 29.7 pg (ref 26.0–34.0)
MCHC: 33.9 g/dL (ref 30.0–36.0)
MCV: 87.6 fL (ref 80.0–100.0)
Monocytes Absolute: 0.4 10*3/uL (ref 0.1–1.0)
Monocytes Relative: 11 %
Neutro Abs: 1.9 10*3/uL (ref 1.7–7.7)
Neutrophils Relative %: 52 %
Platelets: 178 10*3/uL (ref 150–400)
RBC: 4.92 MIL/uL (ref 4.22–5.81)
RDW: 13.3 % (ref 11.5–15.5)
WBC: 3.7 10*3/uL — ABNORMAL LOW (ref 4.0–10.5)
nRBC: 0 % (ref 0.0–0.2)

## 2023-04-01 LAB — BASIC METABOLIC PANEL
Anion gap: 10 (ref 5–15)
BUN: 22 mg/dL (ref 8–23)
CO2: 22 mmol/L (ref 22–32)
Calcium: 9.1 mg/dL (ref 8.9–10.3)
Chloride: 105 mmol/L (ref 98–111)
Creatinine, Ser: 1.14 mg/dL (ref 0.61–1.24)
GFR, Estimated: >60 mL/min (ref >60)
Glucose, Bld: 102 mg/dL — ABNORMAL HIGH (ref 70–99)
Potassium: 4 mmol/L (ref 3.5–5.1)
Sodium: 137 mmol/L (ref 135–145)

## 2023-04-03 ENCOUNTER — Encounter (HOSPITAL_COMMUNITY): Payer: Self-pay | Admitting: Internal Medicine

## 2023-04-03 ENCOUNTER — Encounter (HOSPITAL_COMMUNITY)
Admission: RE | Disposition: A | Discharge status: Home / Self Care | Payer: Self-pay | Source: Home / Self Care | Attending: Internal Medicine

## 2023-04-03 ENCOUNTER — Ambulatory Visit (HOSPITAL_COMMUNITY): Payer: Medicare Other | Admitting: Anesthesiology

## 2023-04-03 ENCOUNTER — Ambulatory Visit (HOSPITAL_COMMUNITY)
Admission: RE | Admit: 2023-04-03 | Discharge: 2023-04-03 | Disposition: A | Discharge status: Home / Self Care | Payer: Medicare Other | Attending: Internal Medicine | Admitting: Internal Medicine

## 2023-04-03 DIAGNOSIS — G4730 Sleep apnea, unspecified: Secondary | ICD-10-CM | POA: Diagnosis not present

## 2023-04-03 DIAGNOSIS — K5730 Diverticulosis of large intestine without perforation or abscess without bleeding: Secondary | ICD-10-CM | POA: Diagnosis not present

## 2023-04-03 DIAGNOSIS — Z87891 Personal history of nicotine dependence: Secondary | ICD-10-CM | POA: Diagnosis not present

## 2023-04-03 DIAGNOSIS — D126 Benign neoplasm of colon, unspecified: Secondary | ICD-10-CM

## 2023-04-03 DIAGNOSIS — Z860100 Personal history of colon polyps, unspecified: Secondary | ICD-10-CM | POA: Diagnosis not present

## 2023-04-03 DIAGNOSIS — K643 Fourth degree hemorrhoids: Secondary | ICD-10-CM | POA: Diagnosis not present

## 2023-04-03 DIAGNOSIS — I10 Essential (primary) hypertension: Secondary | ICD-10-CM | POA: Insufficient documentation

## 2023-04-03 DIAGNOSIS — Z1211 Encounter for screening for malignant neoplasm of colon: Secondary | ICD-10-CM | POA: Diagnosis not present

## 2023-04-03 DIAGNOSIS — K644 Residual hemorrhoidal skin tags: Secondary | ICD-10-CM | POA: Insufficient documentation

## 2023-04-03 DIAGNOSIS — K219 Gastro-esophageal reflux disease without esophagitis: Secondary | ICD-10-CM | POA: Diagnosis not present

## 2023-04-03 DIAGNOSIS — K449 Diaphragmatic hernia without obstruction or gangrene: Secondary | ICD-10-CM | POA: Insufficient documentation

## 2023-04-03 DIAGNOSIS — D122 Benign neoplasm of ascending colon: Secondary | ICD-10-CM | POA: Insufficient documentation

## 2023-04-03 HISTORY — PX: POLYPECTOMY: SHX5525

## 2023-04-03 HISTORY — PX: COLONOSCOPY WITH PROPOFOL: SHX5780

## 2023-04-03 SURGERY — COLONOSCOPY WITH PROPOFOL
Anesthesia: General

## 2023-04-03 MED ORDER — PROPOFOL 10 MG/ML IV BOLUS
INTRAVENOUS | Status: DC | PRN
  Administered 2023-04-03: 70 mg via INTRAVENOUS
  Administered 2023-04-03: 100 ug/kg/min via INTRAVENOUS

## 2023-04-03 MED ORDER — LACTATED RINGERS IV SOLN: INTRAVENOUS | Status: DC

## 2023-04-03 MED ORDER — PROPOFOL 10 MG/ML IV BOLUS
INTRAVENOUS | Status: AC
  Filled 2023-04-03: qty 20

## 2023-04-03 MED ORDER — GLYCOPYRROLATE PF 0.2 MG/ML IJ SOSY
PREFILLED_SYRINGE | INTRAMUSCULAR | Status: AC
  Filled 2023-04-03: qty 1

## 2023-04-03 MED ORDER — GLYCOPYRROLATE PF 0.2 MG/ML IJ SOSY
PREFILLED_SYRINGE | INTRAMUSCULAR | Status: DC | PRN
  Administered 2023-04-03: .2 mg via INTRAVENOUS

## 2023-04-03 MED ORDER — LACTATED RINGERS IV SOLN
INTRAVENOUS | Status: DC | PRN
Start: 2023-04-03 — End: 2023-04-03

## 2023-04-03 MED ORDER — PROPOFOL 500 MG/50ML IV EMUL
INTRAVENOUS | Status: AC
  Filled 2023-04-03: qty 50

## 2023-04-03 NOTE — Transfer of Care (Signed)
Immediate Anesthesia Transfer of Care Note  Patient: Dakota Thompson  Procedure(s) Performed: COLONOSCOPY WITH PROPOFOL POLYPECTOMY  Patient Location: Endoscopy Unit  Anesthesia Type:General  Level of Consciousness: awake, alert , and oriented  Airway & Oxygen Therapy: Patient Spontanous Breathing  Post-op Assessment: Report given to RN and Post -op Vital signs reviewed and stable  Post vital signs: Reviewed and stable  Last Vitals:  Vitals Value Taken Time  BP 90/45 04/03/23 1055  Temp 36.7 C 04/03/23 1055  Pulse 64 04/03/23 1055  Resp 14 04/03/23 1055  SpO2 97 % 04/03/23 1055    Last Pain:  Vitals:   04/03/23 1055  TempSrc: Axillary  PainSc:          Complications: No notable events documented.

## 2023-04-03 NOTE — Op Note (Signed)
[facility] Patient Name: Dakota Thompson Procedure Date: 04/03/2023 10:01 AM MRN: [id number] Date of Birth: 28-Mar-1946 Attending MD: Gennette Pac , MD, [id number] CSN: [id number] Age: 77 Admit Type: Outpatient Procedure:                Colonoscopy Indications:              High risk colon cancer surveillance: Personal                            history of colonic polyps Providers:                Gennette Pac, MD, Francoise Ceo RN, RN, Nena Polio, RN, Lennice Sites Technician, Technician Referring MD:              Medicines:                Propofol per Anesthesia Complications:            No immediate complications. Estimated Blood Loss:     Estimated blood loss was minimal. Procedure:                Pre-Anesthesia Assessment:                           - Prior to the procedure, a History and Physical                            was performed, and patient medications and                            allergies were reviewed. The patient's tolerance of                            previous anesthesia was also reviewed. The risks                            and benefits of the procedure and the sedation                            options and risks were discussed with the patient.                            All questions were answered, and informed consent                            was obtained. Prior Anticoagulants: The patient has                            taken no anticoagulant or antiplatelet agents. ASA                            Grade Assessment: III - A patient with severe  systemic disease. After reviewing the risks and                            benefits, the patient was deemed in satisfactory                            condition to undergo the procedure.                           After obtaining informed consent, the colonoscope                            was passed under direct vision. Throughout the                             procedure, the patient's blood pressure, pulse, and                            oxygen saturations were monitored continuously. The                            [id number]) scope was introduced through the                            anus and advanced to the the cecum, identified by                            appendiceal orifice and ileocecal valve. The                            colonoscopy was performed without difficulty. The                            patient tolerated the procedure well. The quality                            of the bowel preparation was adequate. The                            ileocecal valve, appendiceal orifice, and rectum                            were photographed. The quality of the bowel                            preparation was adequate. The ileocecal valve,                            appendiceal orifice, and rectum were photographed. Scope In: 10:31:15 AM Scope Out: 10:49:29 AM Scope Withdrawal Time: 0 hours 11 minutes 54 seconds  Total Procedure Duration: 0 hours 18 minutes 14 seconds  Findings:      Hemorrhoids were found on perianal exam.      Scattered medium-mouthed diverticula were found in the sigmoid colon and  descending colon.      Nine semi-pedunculated polyps were found in the ascending colon. The       polyps were 5 to 9 mm in size. These polyps were removed with a cold       snare. Resection and retrieval were complete. Estimated blood loss was       minimal.      Non-bleeding external and internal hemorrhoids were found during       endoscopy. The hemorrhoids were moderate, medium-sized and Grade IV       (internal hemorrhoids that prolapse and cannot be reduced manually).      The exam was otherwise without abnormality on direct and retroflexion       views. Impression:               - Hemorrhoids found on perianal exam.                           - Diverticulosis in the sigmoid colon and in the                             descending colon.                           - Nine 5 to 9 mm polyps, removed with a cold snare.                            Resected and retrieved.                           - Non-bleeding external and internal hemorrhoids.                           - The examination was otherwise normal on direct                            and retroflexion views. Moderate Sedation:      Moderate (conscious) sedation was personally administered by an       anesthesia professional. The following parameters were monitored: oxygen       saturation, heart rate, blood pressure, respiratory rate, EKG, adequacy       of pulmonary ventilation, and response to care. Recommendation:           - Patient has a contact number available for                            emergencies. The signs and symptoms of potential                            delayed complications were discussed with the                            patient. Return to normal activities tomorrow.                            Written discharge instructions were provided to the  patient.                           - Advance diet as tolerated.                           - Continue present medications.                           - Repeat colonoscopy date to be determined after                            pending pathology results are reviewed for                            surveillance.                           - Return to GI office (date not yet determined). Procedure Code(s):        --- Professional ---                           [phone], Colonoscopy, flexible; with removal of                            tumor(s), polyp(s), or other lesion(s) by snare                            technique Diagnosis Code(s):        --- Professional ---                           Z86.010, Personal history of colonic polyps                           K64.3, Fourth degree hemorrhoids                           D12.6, Benign neoplasm of colon, unspecified                            K57.30, Diverticulosis of large intestine without                            perforation or abscess without bleeding CPT copyright 2022 American Medical Association. All rights reserved. The codes documented in this report are preliminary and upon coder review may  be revised to meet current compliance requirements. Gerrit Friends. Keeon Zurn, MD Gennette Pac, MD 04/03/2023 11:01:52 AM This report has been signed electronically. Number of Addenda: 0

## 2023-04-03 NOTE — Anesthesia Preprocedure Evaluation (Signed)
Anesthesia Evaluation  Patient identified by MRN, date of birth, ID band Patient awake    Reviewed: Allergy & Precautions, H&P , NPO status , Patient's Chart, lab work & pertinent test results, reviewed documented beta blocker date and time   Airway Mallampati: II  TM Distance: >3 FB Neck ROM: full    Dental no notable dental hx.    Pulmonary neg pulmonary ROS, sleep apnea , former smoker   Pulmonary exam normal breath sounds clear to auscultation       Cardiovascular Exercise Tolerance: Good hypertension, negative cardio ROS  Rhythm:regular Rate:Normal     Neuro/Psych   Anxiety      Neuromuscular disease negative neurological ROS  negative psych ROS   GI/Hepatic negative GI ROS, Neg liver ROS, hiatal hernia,GERD  ,,  Endo/Other  negative endocrine ROS    Renal/GU Renal diseasenegative Renal ROS  negative genitourinary   Musculoskeletal   Abdominal   Peds  Hematology negative hematology ROS (+) Blood dyscrasia, anemia   Anesthesia Other Findings   Reproductive/Obstetrics negative OB ROS                             Anesthesia Physical Anesthesia Plan  ASA: 2  Anesthesia Plan: General   Post-op Pain Management:    Induction:   PONV Risk Score and Plan: Propofol infusion  Airway Management Planned:   Additional Equipment:   Intra-op Plan:   Post-operative Plan:   Informed Consent: I have reviewed the patients History and Physical, chart, labs and discussed the procedure including the risks, benefits and alternatives for the proposed anesthesia with the patient or authorized representative who has indicated his/her understanding and acceptance.     Dental Advisory Given  Plan Discussed with: CRNA  Anesthesia Plan Comments:        Anesthesia Quick Evaluation

## 2023-04-03 NOTE — H&P (Signed)
@LOGO @   Primary Care Physician:  Elfredia Nevins, MD Primary Gastroenterologist:  Dr. Jena Gauss  Pre-Procedure History & Physical: HPI:  Dakota Thompson is a 77 y.o. male here for for update colonoscopy.  Distant history of tubulovillous adenoma removed from his colon negative colonoscopy 5 years ago.  Has occasional symptomatic hemorrhoids.  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 marginal prep compromised exam.    COLONOSCOPY N/A 03/15/2015   Dr. Jena Gauss: tubular adenoma, colonic diverticulosis, surveillance due 2021   COLONOSCOPY WITH PROPOFOL N/A 12/31/2017   Procedure: COLONOSCOPY WITH PROPOFOL;  Surgeon: Corbin Ade, MD;  Location: [facility];  Service: Endoscopy;  Laterality: N/A;  2:00pm   ESOPHAGOGASTRODUODENOSCOPY N/A 03/15/2015   2 cm hiatal hernia, otherwise normal   ESOPHAGOGASTRODUODENOSCOPY (EGD) WITH PROPOFOL N/A 12/31/2017   Procedure: ESOPHAGOGASTRODUODENOSCOPY (EGD) WITH PROPOFOL;  Surgeon: Corbin Ade, MD;  Location: [facility];  Service: Endoscopy;  Laterality: N/A;   EYE SURGERY     bilateral cataracts   GANGLION CYST EXCISION Right    knee cartilage repair Bilateral 01/31/2011   LEFT HEART CATH AND CORONARY ANGIOGRAPHY N/A  07/17/2022   Procedure: LEFT HEART CATH AND CORONARY ANGIOGRAPHY;  Surgeon: [place]az[place], Peter M, MD;  Location: [facility] INVASIVE CV LAB;  Service: Cardiovascular;  Laterality: N/A;   NASAL SINUS SURGERY     removal skin cancer      nose   TOTAL KNEE ARTHROPLASTY Right 10/03/2016   Procedure: RIGHT TOTAL KNEE ARTHROPLASTY;  Surgeon: Eugenia Mcalpine, MD;  Location: [facility];  Service: Orthopedics;  Laterality: Right;    Prior to Admission medications   Medication Sig Start Date End Date Taking? Authorizing Provider  carvedilol (COREG) 6.25 MG tablet Take 6.25 mg by mouth 2 (two) times daily with a meal.   Yes [provider]  Coenzyme Q10 200 MG capsule Take 200 mg by mouth every evening.   Yes [provider]  desloratadine (CLARINEX) 5 MG tablet Take 5 mg by mouth daily.   Yes [provider]  docusate sodium (COLACE) 100 MG capsule Take 200 mg by mouth every morning.   Yes [provider]  Flaxseed, Linseed, (FLAX SEED OIL) 1000 MG CAPS Take 1,000 capsules by mouth 2 (two) times daily.   Yes [provider]  GABAPENTIN PO Take 300 mg by mouth. Takes 1 capsule about 4  or 5 nights per month if needed   Yes [provider]  gemfibrozil (LOPID) 600 MG tablet Take 600 mg by mouth 2 (two) times daily. 11/12/20  Yes [provider]  Multiple  Vitamin (MULTIVITAMIN WITH MINERALS) TABS tablet Take 1 tablet by mouth daily. 50+   Yes [provider]  Omega-3 Fatty Acids (FISH OIL) 1200 MG CAPS Take 1,200-2,400 mg by mouth See admin instructions. 2400 mg in the morning & 1200 mg in the evening.    Yes [provider]  oxymetazoline (AFRIN) 0.05 % nasal spray Place 1 spray into both nostrils 2 (two) times daily as needed for congestion.   Yes [provider]  pantoprazole (PROTONIX) 40 MG tablet Take 40 mg by mouth 2 (two) times daily. 06/04/22  Yes [provider]  pramipexole (MIRAPEX) 0.5 MG tablet Take 0.5 mg by mouth  at bedtime. 06/03/22  Yes [provider]  pravastatin (PRAVACHOL) 40 MG tablet Take 40 mg by mouth at bedtime.   Yes [provider]  Probiotic Product (PROBIOTIC DAILY PO) Take 250 mg by mouth daily. saccharomyces boulardii   Yes [provider]  traZODone (DESYREL) 100 MG tablet Take 100 mg by mouth 2 (two) times daily.   Yes [provider]  Wheat Dextrin (BENEFIBER DRINK MIX PO) Take by mouth.   Yes [provider]  aspirin EC 81 MG tablet Take 81 mg by mouth daily.    [provider]    Allergies as of 03/09/2023 - Review Complete 02/10/2023  Allergen Reaction Noted   Nsaids Rash 02/10/2023    Family History  Problem Relation Age of Onset   Colon cancer Brother        older than 80    Colon cancer Sister        older than 64     Social History   Socioeconomic History   Marital status: Married    Spouse name: Scientist, product/process development    Number of children: 2   Years of education: Not on file   Highest education level: Not on file  Occupational History   Occupation: Retired    Associate Professor: RETIRED  Tobacco Use   Smoking status: Former    Current packs/day: 0.00    Average packs/day: 3.0 packs/day for 28.0 years (84.0 ttl pk-yrs)    Types: Cigarettes    Start date: 05/22/1953    Quit date: 05/22/1981    Years since quitting: 41.8   Smokeless tobacco: Never  Vaping Use   Vaping status: Never Used  Substance and Sexual Activity   Alcohol use: Yes    Comment: rare   Drug use: No   Sexual activity: Yes  Other Topics Concern   Not on file  Social History Narrative   Patient lives at home with his wife.    Patient has 2 children.    Patient is retired.    Social Determinants of Health   Financial Resource Strain: Not on file  Food Insecurity: Not on file  Transportation Needs: Not on file  Physical Activity: Not on file  Stress: Not on file  Social Connections: Not on file  Intimate Partner Violence: Not on file    Review  of Systems: See HPI, otherwise negative ROS  Physical Exam: BP 122/74 (BP Location: Right Arm)   Pulse 62   Temp 97.7 F (36.5 C)   Resp 14   SpO2 97%  General:   Alert,  Well-developed, well-nourished, pleasant and cooperative in NAD Neck:  Supple; no masses or thyromegaly. No significant cervical adenopathy. Lungs:  Clear throughout to auscultation.   No wheezes, crackles, or rhonchi. No acute distress. Heart:  Regular rate and rhythm; no murmurs, clicks, rubs,  or gallops. Abdomen: Non-distended, normal bowel sounds.  Soft and nontender without appreciable mass or hepatosplenomegaly.   Impression/Plan: 77 year old gentleman with a distant history of tubulovillous adenoma.  Intermittent rectal bleeding felt to be hemorrhoids.  Colonoscopy is not being done.  The risks, benefits, limitations, alternatives and imponderables have been reviewed with the patient. Questions have been answered. All parties are agreeable.       Notice: This dictation was prepared with Dragon dictation along with smaller phrase technology. Any transcriptional errors that result from this process are unintentional and may not be corrected upon review.

## 2023-04-03 NOTE — Discharge Instructions (Signed)
  Colonoscopy Discharge Instructions  Read the instructions outlined below and refer to this sheet in the next few weeks. These discharge instructions provide you with general information on caring for yourself after you leave the hospital. Your doctor may also give you specific instructions. While your treatment has been planned according to the most current medical practices available, unavoidable complications occasionally occur. If you have any problems or questions after discharge, call Dr. Jena Gauss at [phone]. ACTIVITY You may resume your regular activity, but move at a slower pace for the next 24 hours.  Take frequent rest periods for the next 24 hours.  Walking will help get rid of the air and reduce the bloated feeling in your belly (abdomen).  No driving for 24 hours (because of the medicine (anesthesia) used during the test).   Do not sign any important legal documents or operate any machinery for 24 hours (because of the anesthesia used during the test).  NUTRITION Drink plenty of fluids.  You may resume your normal diet as instructed by your doctor.  Begin with a light meal and progress to your normal diet. Heavy or fried foods are harder to digest and may make you feel sick to your stomach (nauseated).  Avoid alcoholic beverages for 24 hours or as instructed.  MEDICATIONS You may resume your normal medications unless your doctor tells you otherwise.  WHAT YOU CAN EXPECT TODAY Some feelings of bloating in the abdomen.  Passage of more gas than usual.  Spotting of blood in your stool or on the toilet paper.  IF YOU HAD POLYPS REMOVED DURING THE COLONOSCOPY: No aspirin products for 7 days or as instructed.  No alcohol for 7 days or as instructed.  Eat a soft diet for the next 24 hours.  FINDING OUT THE RESULTS OF YOUR TEST Not all test results are available during your visit. If your test results are not back during the visit, make an appointment with your caregiver to find out the  results. Do not assume everything is normal if you have not heard from your caregiver or the medical facility. It is important for you to follow up on all of your test results.  SEEK IMMEDIATE MEDICAL ATTENTION IF: You have more than a spotting of blood in your stool.  Your belly is swollen (abdominal distention).  You are nauseated or vomiting.  You have a temperature over 101.  You have abdominal pain or discomfort that is severe or gets worse throughout the day.      multiple polyps removed from your colon today (9)   polyp and diverticulosis information provided  You do have persisting grade 4 hemorrhoids (  Keep your appointment with Dr. Lovell Sheehan)   further recommendations to follow pending review of pathology report   at patient request, called Juluis Pitch at [phone] - reviewed findings and recommendations

## 2023-04-04 NOTE — Anesthesia Postprocedure Evaluation (Signed)
Anesthesia Post Note  Patient: Dakota Thompson  Procedure(s) Performed: COLONOSCOPY WITH PROPOFOL POLYPECTOMY  Patient location during evaluation: Phase II Anesthesia Type: General Level of consciousness: awake Pain management: pain level controlled Vital Signs Assessment: post-procedure vital signs reviewed and stable Respiratory status: spontaneous breathing and respiratory function stable Cardiovascular status: blood pressure returned to baseline and stable Postop Assessment: no headache and no apparent nausea or vomiting Anesthetic complications: no Comments: Late entry   No notable events documented.   Last Vitals:  Vitals:   04/03/23 1055 04/03/23 1103  BP: (!) 90/45 107/69  Pulse: 64   Resp: 14   Temp: 36.7 C   SpO2: 97%     Last Pain:  Vitals:   04/03/23 1055  TempSrc: Axillary  PainSc: 0-No pain                 Windell Norfolk

## 2023-04-06 ENCOUNTER — Encounter: Payer: Self-pay | Admitting: Internal Medicine

## 2023-04-06 LAB — SURGICAL PATHOLOGY

## 2023-04-13 ENCOUNTER — Encounter (HOSPITAL_COMMUNITY): Payer: Self-pay | Admitting: Internal Medicine

## 2023-04-30 ENCOUNTER — Ambulatory Visit: Payer: Medicare Other | Admitting: General Surgery

## 2023-04-30 ENCOUNTER — Encounter: Payer: Self-pay | Admitting: General Surgery

## 2023-04-30 ENCOUNTER — Other Ambulatory Visit: Payer: Self-pay | Admitting: Gastroenterology

## 2023-04-30 ENCOUNTER — Telehealth: Payer: Self-pay | Admitting: Gastroenterology

## 2023-04-30 VITALS — BP 109/63 | HR 61 | Temp 97.9°F | Resp 14 | Ht 70.0 in | Wt 238.0 lb

## 2023-04-30 DIAGNOSIS — K625 Hemorrhage of anus and rectum: Secondary | ICD-10-CM

## 2023-04-30 MED ORDER — HYDROCORTISONE 1 % EX CREA: TOPICAL_CREAM | Freq: Two times a day (BID) | CUTANEOUS | 1 refills | Status: AC

## 2023-04-30 NOTE — Telephone Encounter (Signed)
Received phone call from Dr. Lovell Sheehan. Patient self referred to him and seen today for rectal bleeding. Dr. Lovell Sheehan concerned regarding the bleeding in setting of recent colonoscopy on October 11 with evidence of diverticulosis and nine 5 to 9 mm polyps removed.  On exam he did not find any blood in the rectal vault and hemorrhoid noted did not appear to be culprit for bleeding.  Patient reporting blood soaking through his close.  Admits that patient is somewhat a difficult historian.  Recommended patient go for CBC today.  Orders placed.  I will update Dr. Jena Gauss.  Patient's last episode of significant bleeding was on Sunday.  He had a slight amount of blood noted on the toilet tissue per day per Dr. Lovell Sheehan.  Patient has been made aware to contact us with any further bleeding.

## 2023-05-01 ENCOUNTER — Telehealth: Payer: Self-pay | Admitting: Gastroenterology

## 2023-05-01 LAB — CBC WITH DIFFERENTIAL/PLATELET
Basophils Absolute: 0.1 10*3/uL (ref 0.0–0.2)
Basos: 2 %
EOS (ABSOLUTE): 0.1 10*3/uL (ref 0.0–0.4)
Eos: 3 %
Hematocrit: 43.1 % (ref 37.5–51.0)
Hemoglobin: 14.8 g/dL (ref 13.0–17.7)
Immature Grans (Abs): 0 10*3/uL (ref 0.0–0.1)
Immature Granulocytes: 0 %
Lymphocytes Absolute: 1.2 10*3/uL (ref 0.7–3.1)
Lymphs: 33 %
MCH: 29.7 pg (ref 26.6–33.0)
MCHC: 34.3 g/dL (ref 31.5–35.7)
MCV: 87 fL (ref 79–97)
Monocytes Absolute: 0.4 10*3/uL (ref 0.1–0.9)
Monocytes: 10 %
Neutrophils Absolute: 2 10*3/uL (ref 1.4–7.0)
Neutrophils: 52 %
Platelets: 188 10*3/uL (ref 150–450)
RBC: 4.98 x10E6/uL (ref 4.14–5.80)
RDW: 13.1 % (ref 11.6–15.4)
WBC: 3.8 10*3/uL (ref 3.4–10.8)

## 2023-05-01 NOTE — Telephone Encounter (Signed)
Noted  

## 2023-05-01 NOTE — Progress Notes (Signed)
Dakota Thompson; [id number]; 1946/02/17   HPI Patient is a 77 year old white male who was referred to my care by Dr. Sherwood Gambler for evaluation and treatment of hemorrhoidal disease.  Patient states he has been having episodes of blood per rectum which at times soil his underwear.  He also notices blood when he wipes himself.  He has had hemorrhoidal issues in the past and was noted on a recent colonoscopy to have hemorrhoidal disease.  In addition, the colonoscopy done on 04/03/2023 was also significant for sigmoid diverticulosis and the removal of 8 polyps of the ascending colon.  He did have some bleeding before the procedure but this also had intermittent bleeding after the procedure.  He did not notify Dr. Luvenia Starch office about this.  He denies any abdominal pain.  He denies any rectal pain.  He does take aspirin. 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 marginal prep compromised exam.    COLONOSCOPY N/A 03/15/2015   Dr. Jena Gauss: tubular adenoma, colonic diverticulosis, surveillance due 2021   COLONOSCOPY WITH PROPOFOL N/A 12/31/2017   Procedure: COLONOSCOPY WITH PROPOFOL;  Surgeon: Corbin Ade, MD;  Location: [facility];  Service: Endoscopy;  Laterality: N/A;  2:00pm    COLONOSCOPY WITH PROPOFOL N/A 04/03/2023   Procedure: COLONOSCOPY WITH PROPOFOL;  Surgeon: Corbin Ade, MD;  Location: [facility];  Service: Endoscopy;  Laterality: N/A;  1015am, asa 3   ESOPHAGOGASTRODUODENOSCOPY N/A 03/15/2015   2 cm hiatal hernia, otherwise normal   ESOPHAGOGASTRODUODENOSCOPY (EGD) WITH PROPOFOL N/A 12/31/2017   Procedure: ESOPHAGOGASTRODUODENOSCOPY (EGD) WITH PROPOFOL;  Surgeon: Corbin Ade, MD;  Location: [facility];  Service: Endoscopy;  Laterality: N/A;   EYE SURGERY     bilateral cataracts   GANGLION CYST EXCISION Right    knee cartilage repair Bilateral 01/31/2011   LEFT HEART CATH AND CORONARY ANGIOGRAPHY N/A 07/17/2022   Procedure: LEFT HEART CATH AND CORONARY ANGIOGRAPHY;  Surgeon: [place]az[place], Peter M, MD;  Location: [facility];  Service: Cardiovascular;  Laterality: N/A;   NASAL SINUS SURGERY     POLYPECTOMY  04/03/2023   Procedure: POLYPECTOMY;  Surgeon: Corbin Ade, MD;  Location: [facility];  Service: Endoscopy;;   removal skin cancer      nose   TOTAL KNEE ARTHROPLASTY Right 10/03/2016   Procedure: RIGHT TOTAL KNEE ARTHROPLASTY;  Surgeon: Eugenia Mcalpine, MD;  Location: [facility];  Service: Orthopedics;  Laterality: Right;    Family History  Problem Relation Age of Onset   Colon cancer Brother        older than 102  Colon cancer Sister        older than 40     Current Outpatient Medications on File Prior to Visit  Medication Sig Dispense Refill   aspirin EC 81 MG tablet Take 81 mg by mouth daily.     carvedilol (COREG) 6.25 MG tablet Take 6.25 mg by mouth 2 (two) times daily with a meal.     Coenzyme Q10 200 MG capsule Take 200 mg by mouth every evening.     desloratadine (CLARINEX) 5 MG tablet Take 5 mg by mouth daily.     docusate sodium (COLACE) 100 MG capsule Take 200 mg by mouth every morning.     Flaxseed, Linseed, (FLAX SEED OIL) 1000 MG CAPS Take 1,000 capsules by mouth 2 (two) times daily.     GABAPENTIN PO Take 300  mg by mouth. Takes 1 capsule about 4  or 5 nights per month if needed     gemfibrozil (LOPID) 600 MG tablet Take 600 mg by mouth 2 (two) times daily.     Multiple Vitamin (MULTIVITAMIN WITH MINERALS) TABS tablet Take 1 tablet by mouth daily. 50+     Omega-3 Fatty Acids (FISH OIL) 1200 MG CAPS Take 1,200-2,400 mg by mouth See admin instructions. 2400 mg in the morning & 1200 mg in the evening.      oxymetazoline (AFRIN) 0.05 % nasal spray Place 1 spray into both nostrils 2 (two) times daily as needed for congestion.     pantoprazole (PROTONIX) 40 MG tablet Take 40 mg by mouth 2 (two) times daily.     pramipexole (MIRAPEX) 0.5 MG tablet Take 0.5 mg by mouth at bedtime.     pravastatin (PRAVACHOL) 40 MG tablet Take 40 mg by mouth at bedtime.     Probiotic Product (PROBIOTIC DAILY PO) Take 250 mg by mouth daily. saccharomyces boulardii     traZODone (DESYREL) 100 MG tablet Take 100 mg by mouth 2 (two) times daily.     Wheat Dextrin (BENEFIBER DRINK MIX PO) Take by mouth.     No current facility-administered medications on file prior to visit.    Allergies  Allergen Reactions   Nsaids Other (See Comments)    Renal insufficiency.  Uses but sparingly    Social History   Substance and Sexual Activity  Alcohol Use Yes   Comment: rare    Social History   Tobacco Use  Smoking Status Former   Current packs/day: 0.00   Average packs/day: 3.0 packs/day for 28.0 years (84.0 ttl pk-yrs)   Types: Cigarettes   Start date: 05/22/1953   Quit date: 05/22/1981   Years since quitting: 41.9  Smokeless Tobacco Never    Review of Systems  Constitutional:  Positive for malaise/fatigue.  HENT:  Positive for sinus pain.   Eyes:  Positive for blurred vision.  Respiratory: Negative.    Cardiovascular: Negative.   Gastrointestinal:  Positive for heartburn.  Genitourinary:  Positive for frequency.  Musculoskeletal:  Positive for back pain.  Skin: Negative.   Neurological: Negative.    Endo/Heme/Allergies: Negative.   Psychiatric/Behavioral: Negative.      Objective   Vitals:   04/30/23 0957  BP: 109/63  Pulse: 61  Resp: 14  Temp: 97.9 F (36.6 C)  SpO2: 92%    Physical Exam Vitals reviewed.  Constitutional:      Appearance: Normal appearance. He is not ill-appearing.  HENT:     Head: Normocephalic and atraumatic.  Cardiovascular:     Rate and Rhythm: Normal rate  and regular rhythm.     Heart sounds: Normal heart sounds. No murmur heard.    No friction rub. No gallop.  Pulmonary:     Effort: Pulmonary effort is normal. No respiratory distress.     Breath sounds: Normal breath sounds. No stridor. No wheezing, rhonchi or rales.  Abdominal:     General: Bowel sounds are normal. There is no distension.     Palpations: Abdomen is soft. There is no mass.     Tenderness: There is no abdominal tenderness. There is no guarding or rebound.     Hernia: No hernia is present.  Genitourinary:    Comments: Rectal examination reveals an internal and external hemorrhoid at the 11 o'clock position.  The hemorrhoidal mucosa does not appear inflamed.  No blood is noted.  No masses noted in the rectal vault.  Sphincter tone is fair. Skin:    General: Skin is warm and dry.  Neurological:     Mental Status: He is alert and oriented to person, place, and time.    Colonoscopy report reviewed Assessment  Blood per rectum, hemorrhoidal disease, recent colonoscopy with polypectomy, sigmoid diverticulosis. Plan  Is difficult to a certain exactly where the bleeding is coming from.  I did call Tana Coast, PA of gastroenterology.  She will order a CBC.  I did prescribe hydrocortisone cream.  Further management is pending those results.  Patient may need hemorrhoidectomy in the future.  Will follow-up with the patient.

## 2023-05-01 NOTE — Telephone Encounter (Signed)
Please see message from Dr. Jena Gauss. We just checked his CBC so I would hold off on rechecking hemoglobin until after sees Dakota Thompson or unless he has recurrent bleeding.   Please make appt with Dakota Thompson for possible banding.

## 2023-05-01 NOTE — Telephone Encounter (Signed)
-----   Message from Eula Listen sent at 05/01/2023 10:21 AM EST ----- I briefly discussed with Dr. Lovell Sheehan today.  Lets get patient back into see Lewie Loron.  Assess hemoglobin again.  May be best to do some empiric banding and see if we get a clinical response. ----- Message ----- From: Franky Macho, MD Sent: 05/01/2023   7:28 AM EST To: Corbin Ade, MD; Tiffany Kocher, PA-C

## 2023-05-04 NOTE — Telephone Encounter (Signed)
Pt's wife sheila (dpr on file) was made aware and appt with Tobi Bastos was made.

## 2023-05-14 ENCOUNTER — Encounter: Payer: Self-pay | Admitting: Gastroenterology

## 2023-05-14 ENCOUNTER — Ambulatory Visit: Payer: Medicare Other | Admitting: Gastroenterology

## 2023-05-14 VITALS — BP 128/71 | HR 66 | Temp 97.5°F | Ht 69.0 in | Wt 239.6 lb

## 2023-05-14 DIAGNOSIS — K642 Third degree hemorrhoids: Secondary | ICD-10-CM

## 2023-05-14 DIAGNOSIS — K625 Hemorrhage of anus and rectum: Secondary | ICD-10-CM

## 2023-05-14 NOTE — Progress Notes (Signed)
     [facility] BANDING PROCEDURE NOTE  Dakota Thompson is a 78 y.o. male presenting today for consideration of hemorrhoid banding. Last colonoscopy Oct 2024: perianal hemorrhoids, diverticulosis, nine 5-9 mm polyps s/p removal. Non-bleeding external and internal hemorrhoids. Tubular adenomas. Surveillance in 3 years. He has noted fecal seepage and rectal bleeding.    The patient presents with symptomatic grade 3 hemorrhoids, unresponsive to maximal medical therapy, requesting rubber band ligation of his hemorrhoidal disease. All risks, benefits, and alternative forms of therapy were described and informed consent was obtained.  In the left lateral decubitus position, anoscopic examination revealed grade 3 hemorrhoids in predominantly right anterior and posterior positions. Difficult to see left lateral.   The decision was made to band the right posterior internal hemorrhoid, and the [facility] O'Regan System was used to perform band ligation without complication. Small amount of tissue procured. I then banded the right posterior with good results. Digital anorectal examination was then performed to assure proper positioning of the band, and to adjust the banded tissue as required. The patient was discharged home without pain or other issues. Dietary and behavioral recommendations were given, along with follow-up instructions. The patient will return in several weeks for followup and possible additional banding as required.   No complications were encountered and the patient tolerated the procedure well.   Gelene Mink, PhD, ANP-BC [facility]

## 2023-05-14 NOTE — Patient Instructions (Signed)
  Please avoid straining.  You should limit your toilet time to 2-3 minutes at the most.   I recommend Benefiber 2 teaspoons each morning in the beverage of your choice!  Please call me with any concerns or issues!  I will see you in follow-up for additional banding in several weeks.     It was a pleasure to see you today. I want to create trusting relationships with patients and provide genuine, compassionate, and quality care. I truly value your feedback, so please be on the lookout for a survey regarding your visit with me today. I appreciate your time in completing this!    Annitta Needs, PhD, ANP-BC [facility]

## 2023-06-02 ENCOUNTER — Encounter: Payer: Self-pay | Admitting: Gastroenterology

## 2023-06-02 ENCOUNTER — Ambulatory Visit (INDEPENDENT_AMBULATORY_CARE_PROVIDER_SITE_OTHER): Payer: Medicare Other | Admitting: Gastroenterology

## 2023-06-02 VITALS — BP 120/74 | HR 67 | Temp 98.2°F | Ht 70.0 in | Wt 240.4 lb

## 2023-06-02 DIAGNOSIS — K642 Third degree hemorrhoids: Secondary | ICD-10-CM | POA: Diagnosis not present

## 2023-06-02 NOTE — Patient Instructions (Signed)
  Please avoid straining.  You should limit your toilet time to 2-3 minutes at the most.   I recommend Benefiber 2 teaspoons each morning in the beverage of your choice!  Please call me with any concerns or issues!  I will see you in follow-up for possible additional banding in about 4 weeks!  I enjoyed seeing you again today! I value our relationship and want to provide genuine, compassionate, and quality care. You may receive a survey regarding your visit with me, and I welcome your feedback! Thanks so much for taking the time to complete this. I look forward to seeing you again.      Gelene Mink, PhD, ANP-BC [facility]

## 2023-06-02 NOTE — Progress Notes (Signed)
    [facility] BANDING PROCEDURE NOTE  Dakota Thompson is a 77 y.o. male presenting today for consideration of hemorrhoid banding. Last colonoscopy Oct 2024: perianal hemorrhoids, diverticulosis, nine 5-9 mm polyps s/p removal. Non-bleeding external and internal hemorrhoids. Tubular adenomas. Surveillance in 3 years. He has noted fecal seepage and rectal bleeding. First banding in Nov 2024 of right posterior   The patient presents with symptomatic grade 3 hemorrhoids, unresponsive to maximal medical therapy, requesting rubber band ligation of his hemorrhoidal disease. All risks, benefits, and alternative forms of therapy were described and informed consent was obtained.  In the left lateral decubitus position, anoscopic examination revealed grade 3 hemorrhoids in the right anterior (which was oozing) and left lateral. The right posterior was still prominent and could benefit from banding in several weeks.   The decision was made to band the right anterior internal hemorrhoid, and the [facility] O'Regan System was used to perform band ligation without complication. I then banded the left lateral without complication. Digital anorectal examination was then performed to assure proper positioning of the band, and to adjust the banded tissue as required. The patient was discharged home without pain or other issues. Dietary and behavioral recommendations were given, along with follow-up instructions. The patient will return in several weeks for followup and possible additional banding as required. Will assess with anoscopy at next visit as he had large internal hemorrhoids and may need more than 3 total bandings.   No complications were encountered and the patient tolerated the procedure well.   Gelene Mink, PhD, ANP-BC [facility]

## 2023-07-02 ENCOUNTER — Encounter: Payer: Self-pay | Admitting: Gastroenterology

## 2023-07-02 ENCOUNTER — Ambulatory Visit: Payer: Medicare Other | Admitting: Gastroenterology

## 2023-07-02 VITALS — BP 130/61 | HR 68 | Temp 97.6°F | Ht 69.5 in | Wt 240.4 lb

## 2023-07-02 DIAGNOSIS — K642 Third degree hemorrhoids: Secondary | ICD-10-CM

## 2023-07-02 NOTE — Progress Notes (Signed)
    [facility] BANDING PROCEDURE NOTE  Dakota Thompson is a 78 y.o. male presenting today for consideration of hemorrhoid banding. Last colonoscopy  Oct 2024: perianal hemorrhoids, diverticulosis, nine 5-9 mm polyps s/p removal. Non-bleeding external and internal hemorrhoids. Tubular adenomas. Surveillance in 3 years. He has had banding of right posterior, right anterior, and left lateral. May still need right posterior banded as was prominent during last anoscopy.    The patient presents with symptomatic grade 3 hemorrhoids, unresponsive to maximal medical therapy, requesting rubber band ligation of his hemorrhoidal disease. All risks, benefits, and alternative forms of therapy were described and informed consent was obtained.  The decision was made to band neutrally, but insufficient tissue was obtained after band deployment. I then elected to band the right posterior, and the [facility] O'Regan System was used to perform band ligation without complication. Digital anorectal examination was then performed to assure proper positioning of the band, and to adjust the banded tissue as required. The patient was discharged home without pain or other issues. Dietary and behavioral recommendations were given, along with follow-up instructions. The patient will return as needed. Colonoscopy in 2027.   No complications were encountered and the patient tolerated the procedure well.   Therisa MICAEL Stager, PhD, ANP-BC [facility]

## 2023-07-02 NOTE — Patient Instructions (Addendum)
  Please avoid straining.  You should limit your toilet time to 2-3 minutes at the most.   I recommend Benefiber 2 teaspoons each morning in the beverage of your choice!  Please call me with any concerns or issues!  We will see you back as needed!  Your next colonoscopy will be in 2027.    I enjoyed seeing you again today! I value our relationship and want to provide genuine, compassionate, and quality care. You may receive a survey regarding your visit with me, and I welcome your feedback! Thanks so much for taking the time to complete this. I look forward to seeing you again.      Therisa MICAEL Stager, PhD, ANP-BC [facility]

## 2024-01-19 ENCOUNTER — Telehealth: Payer: Self-pay | Admitting: Family Medicine

## 2024-01-19 NOTE — Telephone Encounter (Signed)
 Request to reschedule appointment due to death in his family

## 2024-01-20 NOTE — Telephone Encounter (Signed)
 Request made to r/s due to a conflict

## 2024-01-21 ENCOUNTER — Ambulatory Visit: Payer: Medicare Other | Admitting: Family Medicine

## 2024-03-22 ENCOUNTER — Telehealth: Payer: Self-pay | Admitting: Family Medicine

## 2024-03-22 NOTE — Telephone Encounter (Signed)
 MYC cxl

## 2024-03-23 ENCOUNTER — Telehealth: Payer: Self-pay | Admitting: Family Medicine

## 2024-03-23 DIAGNOSIS — G4733 Obstructive sleep apnea (adult) (pediatric): Secondary | ICD-10-CM

## 2024-03-23 NOTE — Telephone Encounter (Signed)
 Called pt's wife and let her know Per Greig Forbes, NP that we can't send pt CPAP Supplies due to him not being seen since 01/15/2023, insurance is going to deny his supplies b/c he hasn't had an up to date appointment. Insurance is going to need proof that pt is using his machine with his Compliance and Data report as well as Office Visit Notes. Got pt scheduled to see Greig Forbes, NP on 04/26/2024 @ 2:30pm.

## 2024-03-23 NOTE — Telephone Encounter (Signed)
 Pt's wife has scheduled pt's 1 yr f/u and is now asking that pt;s CPAP supplies please be called into [facility]

## 2024-04-04 ENCOUNTER — Ambulatory Visit: Admitting: Family Medicine

## 2024-04-07 ENCOUNTER — Ambulatory Visit: Admitting: Neurology

## 2024-04-07 ENCOUNTER — Encounter: Payer: Self-pay | Admitting: Neurology

## 2024-04-07 VITALS — BP 98/56 | HR 65 | Ht 69.0 in | Wt 242.2 lb

## 2024-04-07 DIAGNOSIS — R0602 Shortness of breath: Secondary | ICD-10-CM

## 2024-04-07 DIAGNOSIS — G4733 Obstructive sleep apnea (adult) (pediatric): Secondary | ICD-10-CM

## 2024-04-07 NOTE — Patient Instructions (Signed)

## 2024-04-07 NOTE — Progress Notes (Signed)
 Subjective:    Patient ID: Dakota Thompson is a 78 y.o. male.  HPI    Interim history:   [name] is a 78 year old male with an underlying complex medical history of neuropathy, reflux disease, hypertension, restless leg syndrome, sleep apnea, thyroid  disease, anemia, anxiety, and obesity, who presents for follow-up consultation of his obstructive sleep apnea, on BiPAP therapy.  The patient is unaccompanied today.  Today, 04/07/2024: I reviewed his BiPAP compliance data from 03/08/2024 through 04/06/2024, which is a total of 30 days, during which time he used his machine every night with percent use days greater than 4 hours at 100%, indicating excellent compliance with an average usage of 8 hours and 56 minutes, residual AHI mildly elevated at 7.8/h on a pressure of 12/8 cm, leak on the low side.  He uses a fullface mask.  He is generally up-to-date with his supplies.  He has a [employer]reader BiPAP machine and reports that he did get a replacement after the recall, probably somewhere in 2022 or 2023.  He has no trouble using the machine and is not keen on getting a new machine or doing another home sleep test quite yet.  He has to establish with a new PCP, he met her once, she is based out of [place], and he has an appointment in November for his physical.  He has noticed occasional shortness of breath with exertion, denies any chest pain and any symptoms today and is encouraged to bring up his concerns with his PCP.  He tries to hydrate well.  He has not noticed any lower extremity swelling.  The patient's allergies, current medications, family history, past medical history, past social history, past surgical history and problem list were reviewed and updated as appropriate.   Previously:   01/15/23 (ALL): << [name] returns for follow up for OSA on BiPAP. He is doing well on therapy. He mentions feeling that he is either not getting enough air or getting too much air from time to  time. This may happen once every 1-2 months. He is not overly bothered by it. He feels mask may blow air in his face at times but, overall, fits well. He denies concerns with machine. He does note benefit of using BiPAP but would prefer not ti have to use it.  >>  01/08/2022 (ALL): << [name] returns for follow up for OSA on BIPAP. He continues to do well on therapy. He received replacement machine from [facility] a few months ago. No concerns with machine or supplies. He is sleeping well. He is following with nephrology for CKD. BP meds have been discontinued for lower BP. He does report feeling a little more tired, recently. No exercise. Blood sugars seem stable. >>  01/08/2021 (ALL): << [name] is a 78 y.o. male here today for follow up for OSA on BiPAP. He continues to do well on his new machine. He has not received a replacement for recalled machine but denies any concerns. He is using BiPAP nightly.  Recent compliance data shows he used BiPAP 30/30 days for greater than 4 hours, compliance 100%. Residual AHI was 2 on 12/8cmH20. No significant leak was noted. >>     10/03/19 (SA): 78 year old right-handed gentleman with an underlying medical history of neuropathy, obesity and restless leg syndrome and mild memory loss, Who presents for follow-up consultation of his obstructive sleep apnea, on treatment with BiPAP therapy.  The patient is unaccompanied today.  I last saw him on 09/30/2018  in a virtual visit.  He indicated that he was interested in a new machine.  He was advised to call back so we could prescribe a new machine through his DME company located in [place], [place] .  He was otherwise advised to follow-up routinely in 1 year.  I was not able to review his BiPAP compliance data as he did not bring his machine. He reports compliance with his machine. He would be willing to take his machine to his DME for a download in the next 2-3 days. He would like to get a new machine. He has continued to  benefit from treatment and is motivated to continue. He has received his Covid vaccine.        I saw him on 09/24/17, at which time he was fully compliant with his BiPAP. He had an interim hospitalization in July 2018 for a perforated sigmoid diverticulitis. He also had interim knee replacement surgery in April 2018 on the right side. He presented to the ER in September 2018 with abdominal pain and was treated for sigmoid diverticulitis and had recurrent flareup of this and abdominal pain, was on a round of antibiotic at our last visit as well.      I saw him on 09/18/2016, at which time he reported doing okay with his BiPAP. His memory was stable. He reported bilateral knee pain and was supposed to have a right knee replacement surgery.    I reviewed his BiPAP compliance data from 08/22/2017 through 09/20/2017, which is a total of 30 days, during which time he used his PAP every night with percent used days greater than 4 hours at 100%, indicating superb compliance with an average usage of 8 hours and 35 minutes, residual AHI at goal at 1.1 per hour, leak on the lower end, pressure of 12/8 cm.    I saw him on 09/13/2015, at which time he was fully compliant with BiPAP. He had gained a little bit of weight. We talked about his cognitive test results at the time. He had seen Dr. Zelson on 06/11/2015 and then for discussion on 06/21/2015, diagnosis was memory loss. Repeat cognitive testing was recommended after one or 2 years for comparison.    I reviewed his BiPAP compliance from 08/19/2016 through 09/17/2016 which is a total of 30 days, during which time he used his machine every night with percent used days greater than 4 hours at 100%, indicating superb compliance with an average usage of 8 hours and 24 minutes, residual AHI low at 1.7 per hour, pressure of 12/8, leak very low.    I saw him on 03/08/2015, at which time he reported being able to tolerate BiPAP a little bit better. Memory-wise, he felt a  little worse. He had knee pain. I reviewed his brain MRI from 03/07/2014 through the PACS system today. I suggested we pursue formal neuropsychological evaluation. I referred him to Dr. Zelson.    I reviewed his BiPAP compliance data from 08/14/2015 through 09/12/2015 which is a total of 30 days during which time he used his machine every night with percent used days greater than 4 hours at 100%, indicating superb compliance with an average usage of 8 hours and 13 minutes, residual AHI 1.2 per hour, leak low, pressure at 12/8 cm.   I saw him on 09/15/2014, at which time he reported R cataract surgery on 08/03/14 and L cataract repair on 08/31/14 with good results. His wife was worried about his lack of mobility. He was not  exercising. He was reporting knee pain. He was not drinking enough water . He was struggling with BiPAP treatment. His wife had noted less twitching of his legs and less snoring and he seemed to sleep more consolidated. As far as his memory, his long-term memory much improved either. He felt he had issues with OCD. He could not tolerate the chin strap.     I reviewed his BiPAP compliance data from 02/06/2015 through 03/07/2015 which is a total of 30 days during which time he used his machine every night with percent used days greater than 4 hours at 100%, indicating superb compliance with an average usage of 7 hours and 36 minutes, pressure of 12/8, residual AHI 1.3, leaked low.    He had a brain MRI wo contrast on 03/08/15: Mild cerebral atrophy, otherwise unremarkable appearance of the brain.   I first met him on 12/30/2013 at the request of Dr. Arlys, at which time we talked about his recent sleep test results including his baseline sleep study from March 2015 as well as a CPAP titration study from April 2015 and we talked about his compliance data on how he felt. He reported sleeping better and he was twitching less during his sleep. In the interim, he was seen by Dr. 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 His MOCA score was 27 at the time.   I reviewed his BiPAP compliance data from 03/24/14 to 06/21/14, which is a total of 90 days, during which time he used his machine every night, with percent used days greater than 4 hours of 91.1%, indicating excellent compliance, with an average usage of 6 hours and 28 minutes, pressure at 12/8, residual AHI at 1.5 per hour, leak low.   I reviewed his BiPAP compliance data from 08/16/2014 through 09/14/2014 which is a total of 30 days during which time he used his machine every night with percent used days greater than 4 hours at 96.7%, indicating excellent compliance. Pressure setting the same, residual AHI low at 1.7 per hour, leak low. Average usage of 6 hours and 37 minutes for all nights.   His baseline sleep study from 09/06/2013 showed a sleep efficiency was reduced at 66.6% with a latency to sleep of 13.5 minutes and wake after sleep onset of 131 minutes with moderate sleep fragmentation noted. He had an elevated arousal index. He had an increased percentage of stage II sleep, 5.6% of slow-wave sleep, and 19.1% of REM sleep with a significantly reduced REM latency of 2 minutes. He had mild periodic leg movements at 17.9 per hour resulting in 4 arousals per hour. He had mild to moderate snoring. He did not achieve much in the way of supine sleep reporting that he's not able to sleep on his back. He had one central apnea and 25 obstructive hypopneas with a total AHI of 5.4 per hour, rising to 9.8 per hour in REM sleep. Baseline oxygen saturation was only 90%, nadir was 84%. Time below 88% saturation was 8 minutes and 18 seconds. He was then requested to come back for a CPAP titration study to help his sleep disordered breathing and in light of his significant desaturations. He had a CPAP titration study on 10/06/2013 which showed a sleep efficiency of 65.4% with a prolonged sleep latency of 59.5 minutes and wake after sleep onset  of 106.5 minutes with moderate sleep fragmentation noted. He had an elevated arousal index at 13.4 per hour primarily because of spontaneous arousals. He had an increased  percentage of light stage sleep, 1.6% of slow-wave sleep, and a mildly decreased percentage of REM sleep at 17.5% with a prolonged REM latency. He had mild PLMS a 10.7 per hour with an associated arousal index of 1.7 per hour. Baseline oxygen saturation was 91%, nadir was 88%. He was started on CPAP at 5 cm. However he was not able to tolerate this and felt panicked. He did not tolerate the nasal pillows mask or nasal mask and did somewhat better with a full facemask. He was switched to BiPAP for better tolerance of the pressures were titrated from 8/4 cm to 12/8 cm. His oxygen saturation appeared to be better on the final setting of 12/8 cm. REM sleep was achieved on the final pressure but very little supine sleep was achieved during the study. His AHI was 0 on the final pressure. Based on the test results I prescribed BiPAP for him.     I reviewed the patient's PAP compliance data from 11/02/13 to 12/01/2013, which is a total of 30 days, during which time the patient used BiPAP.  The average usage for all days was 6 hours and 29 minutes. The percent used days greater than 4 hours was 100 %, indicating superb compliance. The residual AHI was 2.1 per hour, indicating an adequate treatment pressure of 12/8 cwp with low leak reported.   His typical bedtime is reported to be around 11 PM. He reports that he does not really like using his BiPAP but he is getting used to it. Provides report he seems to sleep more soundly and is less restless in his sleep. He has not noted any improvement in his cognitive function yet. He is wondering if he will have to use this machine longer term and whether he has to keep using it.      His Past Medical History Is Significant For: Past Medical History:  Diagnosis Date   Anemia    Anxiety    Cancer (HCC)     Skin   Charcot-Marie-Tooth disease    hx, ?not confirmed by neurology most recently, neuropathy   GERD (gastroesophageal reflux disease)    Hx of adenomatous colonic polyps    tubulovillous adenoma   Hypertension    Pancytopenia    mild   Pancytopenia 02/27/2011   Renal insufficiency    Restless leg syndrome    Sinusitis    Sleep apnea    wears CPAP/BIPAP every night   Thyroid  disease    Viral syndrome    Hx   Viral syndrome 02/27/2011    His Past Surgical History Is Significant For: Past Surgical History:  Procedure Laterality Date   BIOPSY  12/31/2017   Procedure: BIOPSY;  Surgeon: Shaaron Lamar HERO, MD;  Location: [facility];  Service: Endoscopy;;  gastric biopsy    COLONOSCOPY  01/02/2010   RMR: normal rectum and colon. marginal prep compromised exam.    COLONOSCOPY N/A 03/15/2015   Dr. Shaaron: tubular adenoma, colonic diverticulosis, surveillance due 2021   COLONOSCOPY WITH PROPOFOL  N/A 12/31/2017   Procedure: COLONOSCOPY WITH PROPOFOL ;  Surgeon: Shaaron Lamar HERO, MD;  Location: [facility];  Service: Endoscopy;  Laterality: N/A;  2:00pm   COLONOSCOPY WITH PROPOFOL  N/A 04/03/2023   Procedure: COLONOSCOPY WITH PROPOFOL ;  Surgeon: Shaaron Lamar HERO, MD;  Location: [facility];  Service: Endoscopy;  Laterality: N/A;  1015am, asa 3   ESOPHAGOGASTRODUODENOSCOPY N/A 03/15/2015   2 cm hiatal hernia, otherwise normal   ESOPHAGOGASTRODUODENOSCOPY (EGD) WITH PROPOFOL  N/A 12/31/2017  Procedure: ESOPHAGOGASTRODUODENOSCOPY (EGD) WITH PROPOFOL ;  Surgeon: Shaaron Lamar HERO, MD;  Location: [facility];  Service: Endoscopy;  Laterality: N/A;   EYE SURGERY     bilateral cataracts   GANGLION CYST EXCISION Right    knee cartilage repair Bilateral 01/31/2011   LEFT HEART CATH AND CORONARY ANGIOGRAPHY N/A 07/17/2022   Procedure: LEFT HEART CATH AND CORONARY ANGIOGRAPHY;  Surgeon: Swaziland, Peter M, MD;  Location: [facility] INVAS[facility] CV LAB;  Service: Cardiovascular;  Laterality: N/A;   NASAL SINUS  SURGERY     POLYPECTOMY  04/03/2023   Procedure: POLYPECTOMY;  Surgeon: Shaaron Lamar HERO, MD;  Location: [facility];  Service: Endoscopy;;   removal skin cancer      nose   TOTAL KNEE ARTHROPLASTY Right 10/03/2016   Procedure: RIGHT TOTAL KNEE ARTHROPLASTY;  Surgeon: Lamar Collet, MD;  Location: [facility];  Service: Orthopedics;  Laterality: Right;    His Family History Is Significant For: Family History  Problem Relation Age of Onset   Colon cancer Sister        older than 31    Colon cancer Brother        older than 60    Sleep apnea Neg Hx     His Social History Is Significant For: Social History   Socioeconomic History   Marital status: Married    Spouse name: Scientist, product/process development    Number of children: 2   Years of education: Not on file   Highest education level: Not on file  Occupational History   Occupation: Retired    Associate Professor: RETIRED  Tobacco Use   Smoking status: Former    Current packs/day: 0.00    Average packs/day: 3.0 packs/day for 28.0 years (84.0 ttl pk-yrs)    Types: Cigarettes    Start date: 05/22/1953    Quit date: 05/22/1981    Years since quitting: 42.9   Smokeless tobacco: Never  Vaping Use   Vaping status: Never Used  Substance and Sexual Activity   Alcohol use: Yes    Comment: rare beer   Drug use: No   Sexual activity: Yes  Other Topics Concern   Not on file  Social History Narrative   Patient lives at home with his wife.    Patient has 2 children.    Patient is retired.    Social Drivers of Corporate investment banker Strain: Not on file  Food Insecurity: Not on file  Transportation Needs: Not on file  Physical Activity: Not on file  Stress: Not on file  Social Connections: Not on file    His Allergies Are:  Allergies  Allergen Reactions   Nsaids Other (See Comments)    Renal insufficiency.  Uses but sparingly  :   His Current Medications Are:  Outpatient Encounter Medications as of 04/07/2024  Medication Sig   Alpha-Lipoic Acid  600 MG CAPS Take 1 capsule by mouth 2 (two) times daily.   carvedilol (COREG) 6.25 MG tablet Take 6.25 mg by mouth 2 (two) times daily with a meal.   Coenzyme Q10 200 MG capsule Take 200 mg by mouth every evening.   desloratadine (CLARINEX) 5 MG tablet Take 5 mg by mouth daily.   docusate sodium  (COLACE) 100 MG capsule Take 200 mg by mouth every morning.   Flaxseed, Linseed, (FLAX SEED OIL) 1000 MG CAPS Take 1,000 capsules by mouth 2 (two) times daily.   gemfibrozil  (LOPID ) 600 MG tablet Take 600 mg by mouth 2 (two) times daily.  Multiple Vitamin (MULTIVITAMIN WITH MINERALS) TABS tablet Take 1 tablet by mouth daily. 50+   Omega-3 Fatty Acids (FISH OIL) 1200 MG CAPS Take 1,200-2,400 mg by mouth See admin instructions. 2400 mg in the morning & 1200 mg in the evening.    oxymetazoline (AFRIN) 0.05 % nasal spray Place 1 spray into both nostrils 2 (two) times daily as needed for congestion.   pantoprazole  (PROTONIX ) 40 MG tablet Take 40 mg by mouth 2 (two) times daily.   polyethylene glycol (MIRALAX  / GLYCOLAX ) 17 g packet Take 17 g by mouth daily.   pramipexole (MIRAPEX) 0.5 MG tablet Take 0.5 mg by mouth at bedtime.   pravastatin  (PRAVACHOL ) 40 MG tablet Take 40 mg by mouth at bedtime.   Probiotic Product (PROBIOTIC DAILY PO) Take 250 mg by mouth daily. saccharomyces boulardii   traZODone  (DESYREL ) 100 MG tablet Take 100 mg by mouth 2 (two) times daily.   Wheat Dextrin (BENEFIBER DRINK MIX PO) Take by mouth. (Patient taking differently: Take by mouth as needed.)   GABAPENTIN  PO Take 300 mg by mouth. Takes 1 capsule about 4  or 5 nights per month if needed   hydrocortisone  cream 1 % Apply topically 2 (two) times daily. Apply to affected area 2 times daily   Vitamin D, Ergocalciferol, (DRISDOL) 1.25 MG (50000 UNIT) CAPS capsule Take 50,000 Units by mouth once a week.   No facility-administered encounter medications on file as of 04/07/2024.  :  Review of Systems:  Out of a complete 14 point  review of systems, all are reviewed and negative with the exception of these symptoms as listed below:  Review of Systems  Neurological:        Pt here for cpap f/u Pt states no questions or concerns for todays visit   ESS:9     Objective:  Neurological Exam  Physical Exam Physical Examination:   Vitals:   04/07/24 1517  BP: (!) 98/56  Pulse: 65   General Examination: The patient is a very pleasant 78 y.o. male in no acute distress. He appears well-developed and well-nourished and well groomed.   HEENT: Normocephalic, atraumatic, pupils are equal, round and reactive to light, tracking is well-preserved, no obvious nystagmus, hearing is mildly impaired, bilateral hearing aids in place.  Face is symmetric with normal facial animation, speech clear without dysarthria, hypophonia or voice tremor.  Neck is supple, full range of motion noted, no carotid bruits.  Airway examination reveals mild mouth dryness, otherwise stable findings.  Tongue protrudes centrally and palate elevates symmetrically.    Chest: Clear to auscultation without wheezing, rhonchi or crackles noted.   Heart: S1+S2+0, regular and normal without murmurs, rubs or gallops noted.    Abdomen: Soft, non-tender and non-distended.    Extremities: There is no pitting edema in the distal lower extremities bilaterally.    Skin: Warm and dry without trophic changes noted.   Musculoskeletal: exam reveals some bilateral knee discomfort. Range of motion is good. Reports lower back stiffness.   Neurologically:  Mental status: The patient is awake, alert and oriented in all 4 spheres. His immediate and remote memory, attention, language skills and fund of knowledge are fairly good.  There is no evidence of aphasia, agnosia, apraxia or anomia. Speech is clear with normal prosody and enunciation. Thought process is linear. Mood is normal and affect is normal. Cranial nerves II - XII are as described above under HEENT exam. Motor  exam: Normal bulk, strength and tone is noted. There is no  drift, tremor or rebound.  Fine motor skills and coordination: intact grossly.  Cerebellar testing: No dysmetria or intention tremor. There is no truncal or gait ataxia.  Sensory exam: intact to light touch in the upper and lower extremities.  Gait, station and balance: He stands without major difficulty and walks without a walking aid.    Assessment and Plan:  In summary, KEENEN ROESSNER is a 78 year old male with an underlying complex medical history of neuropathy, reflux disease, hypertension, restless leg syndrome, sleep apnea, thyroid  disease, anemia, anxiety, and obesity, who presents for follow-up consultation of his obstructive sleep apnea, on BiPAP therapy. He has been on BiPAP therapy, at a pressure of 12/8 cm and reports ongoing full compliance and ongoing good results.  He recalls receiving a replacement machine after the recall for Respironics machines.  He is compliant with treatment, he uses a fullface mask with good tolerance.  His current apnea scores are mildly elevated but not enough to justify treatment setting changes and he is willing to continue with his current treatment.  He is advised to follow-up routinely in this clinic to see one of our nurse practitioners in about a year.  He is reminded to discuss his shortness of breath symptoms with his PCP at the next appointment which should be coming up in November.  He now is established with a new doctor out of [place], [place]  since Dr. Bertell retired.  I answered all his questions today and the patient was in agreement. I spent 30 minutes in total face-to-face time and in reviewing records during pre-charting, more than 50% of which was spent in counseling and coordination of care, reviewing test results, reviewing medications and treatment regimen and/or in discussing or reviewing the diagnosis of OSA, the prognosis and treatment options. Pertinent laboratory and imaging test  results that were available during this visit with the patient were reviewed by me and considered in my medical decision making (see chart for details).

## 2024-04-20 ENCOUNTER — Encounter: Payer: Self-pay | Admitting: Neurology

## 2024-04-22 NOTE — Telephone Encounter (Signed)
 Pt called wanting to know if the Rx can be sent. Pt states [facility] informed him that they have not received the order for his Cpap supplies. Please advise.

## 2024-04-25 NOTE — Telephone Encounter (Signed)
 Dr. Buck- just clarifying, you just saw pt 04/07/24? Ok to send supplies order?

## 2024-04-25 NOTE — Telephone Encounter (Signed)
 Phone room: please call pt and let him know we faxed supplies order today to [facility].  Please also schedule yearly f/u with Amy per Dr. Buck request. Thank you!

## 2024-04-25 NOTE — Telephone Encounter (Signed)
 When I last saw him a year ago he did not want to pursue a sleep test or a new machine at the time.  He was advised to follow-up in 1 year.  Please advise patient to keep his follow-up appointment as scheduled. We can send supply orders to his DME company.

## 2024-04-25 NOTE — Telephone Encounter (Addendum)
 Dr. Buck- I do not see a BIPAP supplies order placed when you saw pt. Can you place and we will send to DME and then update pt? Thank you  Pt saw Dr. Buck 04/07/24.

## 2024-04-25 NOTE — Telephone Encounter (Signed)
 Yes, sorry, missed read the date, supply order was placed already earlier and he can follow-up in 1 year from now.  May see Amy Lomax.

## 2024-04-25 NOTE — Addendum Note (Signed)
 Addended by: Caira Poche on: 04/25/2024 09:02 AM   Modules accepted: Orders

## 2024-04-26 ENCOUNTER — Encounter: Admitting: Family Medicine

## 2024-05-03 NOTE — Telephone Encounter (Signed)
 ok

## 2024-05-03 NOTE — Telephone Encounter (Signed)
 Phone rep called pt, spoke with wife who said when pt was seen last month by Dr Buck it was their understanding that it would be considered the 1 yr f/u.  Wife states going forward they only want appointments with Dr Buck. Phone rep informed wife this message would be forwarded to RN

## 2024-05-03 NOTE — Telephone Encounter (Signed)
 Okay to schedule I year follow-up with me for next year.

## 2024-05-04 ENCOUNTER — Telehealth: Payer: Self-pay | Admitting: Family Medicine

## 2024-05-04 NOTE — Telephone Encounter (Signed)
 Pt has called to f/u on the Rx for his CPAP supplies being sent to his [facility]

## 2024-05-04 NOTE — Telephone Encounter (Signed)
 Phone room: please call back. We already faxed order 04/25/24. They need to f/u with [facility]

## 2024-08-18 ENCOUNTER — Ambulatory Visit: Admitting: Family Medicine

## 2024-12-12 ENCOUNTER — Ambulatory Visit: Admitting: Family Medicine

## 2025-04-13 ENCOUNTER — Ambulatory Visit: Admitting: Neurology
